# Patient Record
Sex: Male | Born: 1940 | Race: White | Hispanic: No | Smoking: Former smoker
Health system: Southern US, Community
[De-identification: ages and names within clinical notes are randomized; demographics above are authoritative.]

## PROBLEM LIST (undated history)

## (undated) DIAGNOSIS — Z972 Presence of dental prosthetic device (complete) (partial): Secondary | ICD-10-CM

## (undated) DIAGNOSIS — Z950 Presence of cardiac pacemaker: Secondary | ICD-10-CM

## (undated) DIAGNOSIS — G510 Bell's palsy: Secondary | ICD-10-CM

## (undated) DIAGNOSIS — R269 Unspecified abnormalities of gait and mobility: Secondary | ICD-10-CM

## (undated) DIAGNOSIS — R413 Other amnesia: Secondary | ICD-10-CM

## (undated) DIAGNOSIS — I10 Essential (primary) hypertension: Secondary | ICD-10-CM

## (undated) DIAGNOSIS — Z789 Other specified health status: Secondary | ICD-10-CM

## (undated) DIAGNOSIS — I6529 Occlusion and stenosis of unspecified carotid artery: Secondary | ICD-10-CM

## (undated) DIAGNOSIS — E1142 Type 2 diabetes mellitus with diabetic polyneuropathy: Secondary | ICD-10-CM

## (undated) DIAGNOSIS — K7460 Unspecified cirrhosis of liver: Secondary | ICD-10-CM

## (undated) DIAGNOSIS — I639 Cerebral infarction, unspecified: Secondary | ICD-10-CM

## (undated) DIAGNOSIS — N2889 Other specified disorders of kidney and ureter: Secondary | ICD-10-CM

## (undated) DIAGNOSIS — G459 Transient cerebral ischemic attack, unspecified: Secondary | ICD-10-CM

## (undated) DIAGNOSIS — C801 Malignant (primary) neoplasm, unspecified: Secondary | ICD-10-CM

## (undated) DIAGNOSIS — G4730 Sleep apnea, unspecified: Secondary | ICD-10-CM

## (undated) DIAGNOSIS — F0390 Unspecified dementia without behavioral disturbance: Secondary | ICD-10-CM

## (undated) DIAGNOSIS — E119 Type 2 diabetes mellitus without complications: Secondary | ICD-10-CM

## (undated) DIAGNOSIS — F319 Bipolar disorder, unspecified: Secondary | ICD-10-CM

## (undated) DIAGNOSIS — D649 Anemia, unspecified: Secondary | ICD-10-CM

## (undated) HISTORY — DX: Other amnesia: R41.3

## (undated) HISTORY — DX: Anemia, unspecified: D64.9

## (undated) HISTORY — DX: Unspecified dementia, unspecified severity, without behavioral disturbance, psychotic disturbance, mood disturbance, and anxiety: F03.90

## (undated) HISTORY — DX: Occlusion and stenosis of unspecified carotid artery: I65.29

## (undated) HISTORY — DX: Unspecified abnormalities of gait and mobility: R26.9

## (undated) HISTORY — DX: Bell's palsy: G51.0

## (undated) HISTORY — PX: TONSILLECTOMY: SUR1361

## (undated) HISTORY — PX: NASAL SINUS SURGERY: SHX719

## (undated) HISTORY — DX: Type 2 diabetes mellitus with diabetic polyneuropathy: E11.42

## (undated) HISTORY — DX: Bipolar disorder, unspecified: F31.9

## (undated) HISTORY — PX: PACEMAKER PLACEMENT: SHX43

## (undated) HISTORY — DX: Type 2 diabetes mellitus without complications: E11.9

---

## 2004-07-12 ENCOUNTER — Ambulatory Visit: Payer: Self-pay | Admitting: Endocrinology

## 2004-11-14 ENCOUNTER — Ambulatory Visit: Payer: Self-pay | Admitting: Endocrinology

## 2005-02-12 ENCOUNTER — Ambulatory Visit: Payer: Self-pay | Admitting: Endocrinology

## 2005-02-24 ENCOUNTER — Other Ambulatory Visit: Payer: Self-pay

## 2005-02-24 ENCOUNTER — Emergency Department: Payer: Self-pay | Admitting: General Practice

## 2005-02-27 ENCOUNTER — Ambulatory Visit: Payer: Self-pay | Admitting: Emergency Medicine

## 2005-05-14 ENCOUNTER — Ambulatory Visit: Payer: Self-pay | Admitting: Endocrinology

## 2005-08-04 ENCOUNTER — Ambulatory Visit: Payer: Self-pay | Admitting: Endocrinology

## 2005-10-09 ENCOUNTER — Other Ambulatory Visit: Payer: Self-pay

## 2005-10-09 ENCOUNTER — Ambulatory Visit: Payer: Self-pay | Admitting: Otolaryngology

## 2005-12-01 ENCOUNTER — Ambulatory Visit: Payer: Self-pay | Admitting: Endocrinology

## 2006-03-10 ENCOUNTER — Ambulatory Visit: Payer: Self-pay | Admitting: Endocrinology

## 2006-07-06 ENCOUNTER — Ambulatory Visit: Payer: Self-pay | Admitting: Endocrinology

## 2006-11-03 ENCOUNTER — Ambulatory Visit: Payer: Self-pay | Admitting: Endocrinology

## 2007-03-29 ENCOUNTER — Encounter: Payer: Self-pay | Admitting: Endocrinology

## 2007-03-29 DIAGNOSIS — J45909 Unspecified asthma, uncomplicated: Secondary | ICD-10-CM | POA: Insufficient documentation

## 2007-03-29 DIAGNOSIS — Z87442 Personal history of urinary calculi: Secondary | ICD-10-CM | POA: Insufficient documentation

## 2007-03-29 DIAGNOSIS — D649 Anemia, unspecified: Secondary | ICD-10-CM | POA: Insufficient documentation

## 2007-03-29 DIAGNOSIS — J309 Allergic rhinitis, unspecified: Secondary | ICD-10-CM | POA: Insufficient documentation

## 2007-03-29 DIAGNOSIS — F419 Anxiety disorder, unspecified: Secondary | ICD-10-CM

## 2007-03-29 DIAGNOSIS — M1990 Unspecified osteoarthritis, unspecified site: Secondary | ICD-10-CM

## 2007-03-29 DIAGNOSIS — F329 Major depressive disorder, single episode, unspecified: Secondary | ICD-10-CM

## 2007-06-22 ENCOUNTER — Ambulatory Visit: Payer: Self-pay | Admitting: Endocrinology

## 2007-06-22 ENCOUNTER — Encounter: Payer: Self-pay | Admitting: Endocrinology

## 2007-06-22 DIAGNOSIS — E119 Type 2 diabetes mellitus without complications: Secondary | ICD-10-CM

## 2007-06-22 LAB — CONVERTED CEMR LAB: Hgb A1c MFr Bld: 6.2 % — ABNORMAL HIGH (ref 4.6–6.0)

## 2007-09-10 ENCOUNTER — Encounter: Payer: Self-pay | Admitting: Endocrinology

## 2007-10-19 ENCOUNTER — Ambulatory Visit: Payer: Self-pay | Admitting: Endocrinology

## 2007-10-19 LAB — CONVERTED CEMR LAB: Hgb A1c MFr Bld: 6.2 % — ABNORMAL HIGH (ref 4.6–6.0)

## 2008-02-16 ENCOUNTER — Encounter: Payer: Self-pay | Admitting: Endocrinology

## 2008-02-17 ENCOUNTER — Ambulatory Visit: Payer: Self-pay | Admitting: Endocrinology

## 2008-02-17 LAB — CONVERTED CEMR LAB: Hgb A1c MFr Bld: 6.3 % — ABNORMAL HIGH (ref 4.6–6.0)

## 2008-07-04 ENCOUNTER — Ambulatory Visit: Payer: Self-pay | Admitting: Endocrinology

## 2008-07-06 ENCOUNTER — Telehealth: Payer: Self-pay | Admitting: Endocrinology

## 2008-09-14 ENCOUNTER — Telehealth: Payer: Self-pay | Admitting: Endocrinology

## 2008-12-05 ENCOUNTER — Ambulatory Visit: Payer: Self-pay | Admitting: Endocrinology

## 2009-01-30 ENCOUNTER — Telehealth: Payer: Self-pay | Admitting: Endocrinology

## 2009-08-15 ENCOUNTER — Ambulatory Visit: Payer: Self-pay | Admitting: Family Medicine

## 2011-01-17 NOTE — Consult Note (Signed)
[facility]                            ENDOCRINOLOGY CONSULTATION   NAME:[name] [name], [name] [name]                   MRN:          [id number]  DATE:07/06/2006                            DOB:          1941/06/17    REASON FOR VISIT:  Follow-up diabetes.   HISTORY OF PRESENT ILLNESS:  A 70 year old man who states his glucoses are  well controlled except it is slightly high in the morning.  He feels this is  higher than it is at h.s., due to his bedtime insulin possibly not being  enough to cover his bedtime snack.   PAST MEDICAL HISTORY:  Same at August 04, 2005.   REVIEW OF SYSTEMS:  Denies hypoglycemia.   PHYSICAL EXAMINATION:  GENERAL APPEARANCE:  No distress.  Insulin injection  sites at the anterior abdomen are normal.  VITAL SIGNS:  Blood pressure 149/85, heart rate 67, temperature 98, weight  274.   LABORATORY STUDIES:  On July 06, 2006, hemoglobin A1c is 6.6.   IMPRESSION:  Well controlled diabetes.   PLAN:  1. Continue Humalog 30 breakfast, 20 lunch, 45 at supper and 15 at h.s.,      if you eat a snack at h.s.  2. Return in about 4 months.    ______________________________  Cleophas Dunker. Everardo All, MD    SAE/MedQ  DD: 07/07/2006  DT: 07/08/2006  Job #: [id number]   cc:   Aram Beecham, M.D.

## 2011-01-17 NOTE — Consult Note (Signed)
[facility]                          ENDOCRINOLOGY CONSULTATION   NAME:[name], [name] BUCKLIN                   MRN:          [id number]  DATE:11/03/2006                            DOB:          01/21/1941    REASON FOR VISIT:  Follow up diabetes.   HISTORY OF PRESENT ILLNESS:  A 70 year old man who states his glucoses  have increased to the mid 100s at most times of day but are the highest  at bedtime when they are about 200.   PAST MEDICAL HISTORY:  1. Depression/anxiety.  2. Thalassemia minor.  3. Osteoarthritis.  4. Asthma.  5. Allergic rhinitis.  6. Urolithiasis.  7. Prostatitis.  8. Sleep apnea.  9. Diabetes, peripheral sensory neuropathy.   REVIEW OF SYSTEMS:  He has mild occasional hypoglycemia after lunch.  He  has lost 15 pounds since his last visit, due to his efforts.   PHYSICAL EXAMINATION:  VITAL SIGNS:  Blood pressure 137/72, heart rate  74, temperature 97.9.  The weight is 262.  GENERAL:  In no distress.  EXTREMITIES:  Feet normal color and temperature.  There is no ulcer  present on the feet.  Dorsalis pedis pulses are intact bilaterally.  Sensation is intact on the feet to touch, but it is decreased from  normal.   LABORATORY STUDIES:  Hemoglobin A1C 7.5.   IMPRESSION:  He needs an increase in his insulin.   PLAN:  1. Increase q.a.c. Humalog to 30 (15 lunch and 55 at supper).  2. Return in three months or sooner if glucoses do not come back down      to the low 100s with this new schedule.     Sean A. Everardo All, MD  Electronically Signed    SAE/MedQ  DD: 11/04/2006  DT: 11/04/2006  Job #: [id number]   cc:   Aram Beecham, M.D.

## 2013-11-14 DIAGNOSIS — I350 Nonrheumatic aortic (valve) stenosis: Secondary | ICD-10-CM | POA: Insufficient documentation

## 2014-05-22 DIAGNOSIS — Z794 Long term (current) use of insulin: Secondary | ICD-10-CM | POA: Insufficient documentation

## 2014-05-22 DIAGNOSIS — E1165 Type 2 diabetes mellitus with hyperglycemia: Secondary | ICD-10-CM | POA: Insufficient documentation

## 2014-05-22 DIAGNOSIS — E118 Type 2 diabetes mellitus with unspecified complications: Secondary | ICD-10-CM

## 2014-05-22 DIAGNOSIS — IMO0002 Reserved for concepts with insufficient information to code with codable children: Secondary | ICD-10-CM | POA: Insufficient documentation

## 2014-05-23 DIAGNOSIS — E662 Morbid (severe) obesity with alveolar hypoventilation: Secondary | ICD-10-CM | POA: Insufficient documentation

## 2014-05-23 DIAGNOSIS — E538 Deficiency of other specified B group vitamins: Secondary | ICD-10-CM | POA: Insufficient documentation

## 2015-01-19 DIAGNOSIS — E1159 Type 2 diabetes mellitus with other circulatory complications: Secondary | ICD-10-CM | POA: Insufficient documentation

## 2015-01-19 DIAGNOSIS — I10 Essential (primary) hypertension: Secondary | ICD-10-CM

## 2015-02-05 DIAGNOSIS — G4733 Obstructive sleep apnea (adult) (pediatric): Secondary | ICD-10-CM | POA: Insufficient documentation

## 2015-02-05 DIAGNOSIS — I6523 Occlusion and stenosis of bilateral carotid arteries: Secondary | ICD-10-CM | POA: Insufficient documentation

## 2015-02-05 DIAGNOSIS — E1169 Type 2 diabetes mellitus with other specified complication: Secondary | ICD-10-CM | POA: Insufficient documentation

## 2015-02-05 DIAGNOSIS — E785 Hyperlipidemia, unspecified: Secondary | ICD-10-CM

## 2015-03-08 ENCOUNTER — Inpatient Hospital Stay: Admission: RE | Admit: 2015-03-08 | Payer: Self-pay | Source: Ambulatory Visit

## 2015-03-19 ENCOUNTER — Inpatient Hospital Stay: Admission: RE | Admit: 2015-03-19 | Payer: Self-pay | Source: Ambulatory Visit

## 2015-05-17 ENCOUNTER — Other Ambulatory Visit: Payer: Medicare Other

## 2015-05-31 ENCOUNTER — Ambulatory Visit: Admission: RE | Admit: 2015-05-31 | Payer: Medicare Other | Source: Ambulatory Visit | Admitting: Otolaryngology

## 2015-05-31 ENCOUNTER — Encounter: Admission: RE | Payer: Self-pay | Source: Ambulatory Visit

## 2015-05-31 SURGERY — SINUS SURGERY, ENDOSCOPIC
Anesthesia: Choice | Laterality: Bilateral

## 2016-02-17 ENCOUNTER — Encounter: Payer: Self-pay | Admitting: Emergency Medicine

## 2016-02-17 ENCOUNTER — Emergency Department
Admission: EM | Admit: 2016-02-17 | Discharge: 2016-02-17 | Disposition: A | Discharge status: Home / Self Care | Payer: Medicare Other | Attending: Emergency Medicine | Admitting: Emergency Medicine

## 2016-02-17 ENCOUNTER — Emergency Department: Payer: Medicare Other

## 2016-02-17 DIAGNOSIS — F329 Major depressive disorder, single episode, unspecified: Secondary | ICD-10-CM | POA: Diagnosis not present

## 2016-02-17 DIAGNOSIS — Y9389 Activity, other specified: Secondary | ICD-10-CM | POA: Insufficient documentation

## 2016-02-17 DIAGNOSIS — E119 Type 2 diabetes mellitus without complications: Secondary | ICD-10-CM | POA: Diagnosis not present

## 2016-02-17 DIAGNOSIS — W1839XA Other fall on same level, initial encounter: Secondary | ICD-10-CM | POA: Insufficient documentation

## 2016-02-17 DIAGNOSIS — J45909 Unspecified asthma, uncomplicated: Secondary | ICD-10-CM | POA: Insufficient documentation

## 2016-02-17 DIAGNOSIS — S42251A Displaced fracture of greater tuberosity of right humerus, initial encounter for closed fracture: Secondary | ICD-10-CM | POA: Insufficient documentation

## 2016-02-17 DIAGNOSIS — M810 Age-related osteoporosis without current pathological fracture: Secondary | ICD-10-CM | POA: Insufficient documentation

## 2016-02-17 DIAGNOSIS — S4991XA Unspecified injury of right shoulder and upper arm, initial encounter: Secondary | ICD-10-CM | POA: Diagnosis present

## 2016-02-17 DIAGNOSIS — Y999 Unspecified external cause status: Secondary | ICD-10-CM | POA: Insufficient documentation

## 2016-02-17 DIAGNOSIS — S42301A Unspecified fracture of shaft of humerus, right arm, initial encounter for closed fracture: Secondary | ICD-10-CM

## 2016-02-17 DIAGNOSIS — Y9289 Other specified places as the place of occurrence of the external cause: Secondary | ICD-10-CM | POA: Diagnosis not present

## 2016-02-17 MED ORDER — OXYCODONE-ACETAMINOPHEN 7.5-325 MG PO TABS: 1.0000 | ORAL_TABLET | ORAL | Status: DC | PRN

## 2016-02-17 MED ORDER — KETOROLAC TROMETHAMINE 30 MG/ML IJ SOLN
30.0000 mg | Freq: Once | INTRAMUSCULAR | Status: AC
  Administered 2016-02-17: 30 mg via INTRAMUSCULAR
  Filled 2016-02-17: qty 1

## 2016-02-17 MED ORDER — OXYCODONE-ACETAMINOPHEN 5-325 MG PO TABS
ORAL_TABLET | ORAL | Status: AC
  Administered 2016-02-17: 1 via ORAL
  Filled 2016-02-17: qty 1

## 2016-02-17 MED ORDER — OXYCODONE-ACETAMINOPHEN 5-325 MG PO TABS
1.0000 | ORAL_TABLET | Freq: Once | ORAL | Status: AC
  Administered 2016-02-17: 1 via ORAL

## 2016-02-17 NOTE — Discharge Instructions (Signed)
Humerus Fracture Treated With Immobilization The humerus is the large bone in the upper arm. A broken (fractured) humerus is often treated by wearing a cast, splint, or sling (immobilization). This holds the broken pieces in place so they can heal.  HOME CARE  Put ice on the injured area.  Put ice in a plastic bag.  Place a towel between your skin and the bag.  Leave the ice on for 15-20 minutes, 03-04 times a day.  If you are given a cast:  Do not scratch the skin under the cast.  Check the skin around the cast every day. You may put lotion on any red or sore areas.  Keep the cast dry and clean.  If you are given a splint:  Wear the splint as told.  Keep the splint clean and dry.  Loosen the elastic around the splint if your fingers become numb, cold, tingle, or turn blue.  If you are given a sling:  Wear the sling as told.  Do not put pressure on any part of the cast or splint until it is fully hardened.  The cast or splint must be protected with a plastic bag during bathing. Do not lower the cast or splint into water.  Only take medicine as told by your doctor.  Do exercises as told by your doctor.  Follow up as told by your doctor. GET HELP RIGHT AWAY IF:   Your skin or fingernails turn blue or gray.  Your arm feels cold or numb.  You have very bad pain in the injured arm.  You are having problems with the medicines you were given. MAKE SURE YOU:   Understand these instructions.  Will watch your condition.  Will get help right away if you are not doing well or get worse.   This information is not intended to replace advice given to you by your health care provider. Make sure you discuss any questions you have with your health care provider.   Document Released: 02/04/2008 Document Revised: 09/08/2014 Document Reviewed: 01/10/2015 Elsevier Interactive Patient Education [employer].  Please return immediately if condition worsens. Please contact  her primary physician or the physician you were given for referral. If you have any specialist physicians involved in her treatment and plan please also contact them. Thank you for using [facility].

## 2016-02-17 NOTE — ED Notes (Signed)
Patient presents to Emergency Department via EMS with complaints of right shoulder pain s/p falling in concrete driveway.  Pt reports turning and losing balance then stumbling down inclined ultimately landing with right shoulder on grass area.  Pt worse with movement.    Circulation and sensation intact to right hand, no deformities noted.

## 2016-02-17 NOTE — ED Provider Notes (Signed)
Time Seen: Approximately 0 7:30  I have reviewed the triage notes  Chief Complaint: Fall and Shoulder Injury   History of Present Illness: [name] Day is a 75 y.o. male who had a non-syncopal fall today. He states he was in the driveway and then over to pick up something and lost his balance. He states he thinks he landed directly on his right shoulder. He denies any head trauma or loss of consciousness. He states he's got some mild soreness in his right hip but is been able to get up and ambulate without discomfort. He denies any cervical thoracic or lumbar spine pain.   History reviewed. No pertinent past medical history.  Patient Active Problem List   Diagnosis Date Noted  . DM 06/22/2007  . ANEMIA-NOS 03/29/2007  . ANXIETY 03/29/2007  . DEPRESSION 03/29/2007  . ALLERGIC RHINITIS 03/29/2007  . ASTHMA 03/29/2007  . OSTEOARTHRITIS 03/29/2007  . NEPHROLITHIASIS, HX OF 03/29/2007    History reviewed. No pertinent past surgical history.  History reviewed. No pertinent past surgical history.  Current Outpatient Rx  Name  Route  Sig  Dispense  Refill  . oxyCODONE-acetaminophen (PERCOCET) 7.5-325 MG tablet   Oral   Take 1 tablet by mouth every 4 (four) hours as needed for severe pain.   20 tablet   0     Allergies:  Lisinopril and Penicillins  Family History: History reviewed. No pertinent family history.  Social History: Social History  Substance Use Topics  . Smoking status: Never Smoker   . Smokeless tobacco: None  . Alcohol Use: No     Review of Systems:   10 point review of systems was performed and was otherwise negative:  Constitutional: No fever Eyes: No visual disturbances ENT: No sore throat, ear pain Cardiac: No chest pain Respiratory: No shortness of breath, wheezing, or stridor Abdomen: No abdominal pain, no vomiting, No diarrhea Endocrine: No weight loss, No night sweats Extremities: No peripheral edema, cyanosis Skin: No rashes,  easy bruising Neurologic: No focal weakness, trouble with speech or swollowing Urologic: No dysuria, Hematuria, or urinary frequency   Physical Exam:  ED Triage Vitals  Enc Vitals Group     BP 02/17/16 0629 160/50 mmHg     Pulse Rate 02/17/16 0629 74     Resp 02/17/16 0629 20     Temp 02/17/16 0629 98.3 F (36.8 C)     Temp Source 02/17/16 0629 Oral     SpO2 02/17/16 0629 96 %     Weight 02/17/16 0629 285 lb (129.275 kg)     Height 02/17/16 0629 5\' 9"  (1.753 m)     Head Cir --      Peak Flow --      Pain Score 02/17/16 0633 7     Pain Loc --      Pain Edu? --      Excl. in [place]? --     General: Awake , Alert , and Oriented times 3; GCS 15 Head: Normal cephalic , atraumatic Eyes: Pupils equal , round, reactive to light Nose/Throat: No nasal drainage, patent upper airway without erythema or exudate.  Neck: Supple, Full range of motion, No anterior adenopathy or palpable thyroid masses Lungs: Clear to ascultation without wheezes , rhonchi, or rales Heart: Regular rate, regular rhythm without murmurs , gallops , or rubs Abdomen: Obese, Soft, non tender without rebound, guarding , or rigidity; bowel sounds positive and symmetric in all 4 quadrants. No organomegaly .  Extremities: Examination of the right shoulder shows some crepitus and tenderness over the lateral surface of the proximal humerus. Joint appears overall stable. Neurologic and vascular function the right upper extremity is intact over radial ulnar and medial nerve distribution along with a radial and ulnar arteries. Neurologic: normal ambulation, Motor symmetric without deficits, sensory intact Skin: warm, dry, no rashes   L  Radiology: *     DG Shoulder Right (Final result) Result time: 02/17/16 07:10:12   Final result by Rad Results In Interface (02/17/16 07:10:12)   Narrative:   CLINICAL DATA: Acute right shoulder pain after fall today. Initial encounter.  EXAM: RIGHT SHOULDER - 2+  VIEW  COMPARISON: None.  FINDINGS: Mildly displaced fracture is seen involving the greater tuberosity of the proximal right humerus. No dislocation is noted. Joint spaces appear to be intact. Visualize ribs appear normal.  IMPRESSION: Mildly displaced fracture involving the greater tuberosity of the proximal right humerus.   Electronically Signed By: Marijo Conception, M.D. On: 02/17/2016 07:10        I personally reviewed the radiologic studies   Procedures: *Patient application of a right arm sling by the nurse assistant ED Course: * Patient's stay here was uneventful and his extremity appears to be neurovascularly intact. Patient will likely heal by secondary intent and was had a right arm sling applied. He was given a prescription for Percocet and advised take ibuprofen over-the-counter. He states he was followed by [facility] clinic and will be referred to orthopedics there.    Assessment:  Proximal fracture of the right humerus (greater tuberosity)   Final Clinical Impression:  Final diagnoses:  Fracture, humerus closed, right, initial encounter     Plan:  Outpatient Percocet Patient was advised to return immediately if condition worsens. Patient was advised to follow up with their primary care physician or other specialized physicians involved in their outpatient care. The patient and/or family member/power of attorney had laboratory results reviewed at the bedside. All questions and concerns were addressed and appropriate discharge instructions were distributed by the nursing staff.             [name] Larsen, MD 02/17/16 [phone]

## 2016-03-27 ENCOUNTER — Other Ambulatory Visit: Payer: Self-pay | Admitting: Physician Assistant

## 2016-03-27 DIAGNOSIS — S42231A 3-part fracture of surgical neck of right humerus, initial encounter for closed fracture: Secondary | ICD-10-CM

## 2016-04-09 ENCOUNTER — Ambulatory Visit
Admission: RE | Admit: 2016-04-09 | Discharge: 2016-04-09 | Disposition: A | Discharge status: Home / Self Care | Payer: Medicare Other | Source: Ambulatory Visit | Attending: Physician Assistant | Admitting: Physician Assistant

## 2016-04-09 DIAGNOSIS — S42231A 3-part fracture of surgical neck of right humerus, initial encounter for closed fracture: Secondary | ICD-10-CM

## 2016-04-21 ENCOUNTER — Other Ambulatory Visit: Payer: Self-pay | Admitting: Internal Medicine

## 2016-04-21 DIAGNOSIS — R55 Syncope and collapse: Secondary | ICD-10-CM

## 2016-04-21 DIAGNOSIS — Z967 Presence of other bone and tendon implants: Secondary | ICD-10-CM | POA: Insufficient documentation

## 2016-04-24 ENCOUNTER — Emergency Department: Payer: Medicare Other

## 2016-04-24 ENCOUNTER — Inpatient Hospital Stay
Admission: EM | Admit: 2016-04-24 | Discharge: 2016-04-24 | DRG: 309 | Disposition: A | Discharge status: Other Acute Inpatient Hospital | Payer: Medicare Other | Attending: Internal Medicine | Admitting: Internal Medicine

## 2016-04-24 ENCOUNTER — Inpatient Hospital Stay: Admit: 2016-04-24 | Payer: Medicare Other

## 2016-04-24 ENCOUNTER — Encounter: Payer: Self-pay | Admitting: Emergency Medicine

## 2016-04-24 ENCOUNTER — Ambulatory Visit: Admission: RE | Admit: 2016-04-24 | Payer: Medicare Other | Source: Ambulatory Visit

## 2016-04-24 DIAGNOSIS — D751 Secondary polycythemia: Secondary | ICD-10-CM | POA: Diagnosis present

## 2016-04-24 DIAGNOSIS — Z87442 Personal history of urinary calculi: Secondary | ICD-10-CM

## 2016-04-24 DIAGNOSIS — I248 Other forms of acute ischemic heart disease: Secondary | ICD-10-CM | POA: Diagnosis present

## 2016-04-24 DIAGNOSIS — E039 Hypothyroidism, unspecified: Secondary | ICD-10-CM | POA: Diagnosis present

## 2016-04-24 DIAGNOSIS — Z880 Allergy status to penicillin: Secondary | ICD-10-CM | POA: Diagnosis not present

## 2016-04-24 DIAGNOSIS — I4430 Unspecified atrioventricular block: Secondary | ICD-10-CM | POA: Diagnosis present

## 2016-04-24 DIAGNOSIS — Z8673 Personal history of transient ischemic attack (TIA), and cerebral infarction without residual deficits: Secondary | ICD-10-CM

## 2016-04-24 DIAGNOSIS — Z888 Allergy status to other drugs, medicaments and biological substances status: Secondary | ICD-10-CM

## 2016-04-24 DIAGNOSIS — J45909 Unspecified asthma, uncomplicated: Secondary | ICD-10-CM | POA: Diagnosis present

## 2016-04-24 DIAGNOSIS — Z823 Family history of stroke: Secondary | ICD-10-CM | POA: Diagnosis not present

## 2016-04-24 DIAGNOSIS — R55 Syncope and collapse: Secondary | ICD-10-CM

## 2016-04-24 DIAGNOSIS — D696 Thrombocytopenia, unspecified: Secondary | ICD-10-CM | POA: Diagnosis present

## 2016-04-24 DIAGNOSIS — G4733 Obstructive sleep apnea (adult) (pediatric): Secondary | ICD-10-CM | POA: Diagnosis present

## 2016-04-24 DIAGNOSIS — R0902 Hypoxemia: Secondary | ICD-10-CM | POA: Diagnosis present

## 2016-04-24 DIAGNOSIS — R531 Weakness: Secondary | ICD-10-CM

## 2016-04-24 DIAGNOSIS — E1165 Type 2 diabetes mellitus with hyperglycemia: Secondary | ICD-10-CM | POA: Diagnosis present

## 2016-04-24 DIAGNOSIS — Z9889 Other specified postprocedural states: Secondary | ICD-10-CM

## 2016-04-24 DIAGNOSIS — W07XXXA Fall from chair, initial encounter: Secondary | ICD-10-CM | POA: Diagnosis present

## 2016-04-24 DIAGNOSIS — R001 Bradycardia, unspecified: Secondary | ICD-10-CM | POA: Diagnosis present

## 2016-04-24 DIAGNOSIS — I10 Essential (primary) hypertension: Secondary | ICD-10-CM | POA: Diagnosis present

## 2016-04-24 DIAGNOSIS — Z8249 Family history of ischemic heart disease and other diseases of the circulatory system: Secondary | ICD-10-CM

## 2016-04-24 DIAGNOSIS — I495 Sick sinus syndrome: Secondary | ICD-10-CM

## 2016-04-24 DIAGNOSIS — Z87891 Personal history of nicotine dependence: Secondary | ICD-10-CM

## 2016-04-24 DIAGNOSIS — R739 Hyperglycemia, unspecified: Secondary | ICD-10-CM

## 2016-04-24 DIAGNOSIS — E875 Hyperkalemia: Secondary | ICD-10-CM | POA: Diagnosis present

## 2016-04-24 DIAGNOSIS — Z794 Long term (current) use of insulin: Secondary | ICD-10-CM

## 2016-04-24 DIAGNOSIS — Z833 Family history of diabetes mellitus: Secondary | ICD-10-CM

## 2016-04-24 HISTORY — DX: Transient cerebral ischemic attack, unspecified: G45.9

## 2016-04-24 LAB — URINALYSIS COMPLETE WITH MICROSCOPIC (ARMC ONLY)
BILIRUBIN URINE: NEGATIVE
Bacteria, UA: NONE SEEN
Glucose, UA: >500 mg/dL — AB
HGB URINE DIPSTICK: NEGATIVE
KETONES UR: NEGATIVE mg/dL
LEUKOCYTES UA: NEGATIVE
NITRITE: NEGATIVE
PH: 6 (ref 5.0–8.0)
PROTEIN: 100 mg/dL — AB
SPECIFIC GRAVITY, URINE: 1.011 (ref 1.005–1.030)

## 2016-04-24 LAB — COMPREHENSIVE METABOLIC PANEL
ALT: 23 U/L (ref 17–63)
AST: 44 U/L — ABNORMAL HIGH (ref 15–41)
Albumin: 3.9 g/dL (ref 3.5–5.0)
Alkaline Phosphatase: 86 U/L (ref 38–126)
Anion gap: 8 (ref 5–15)
BILIRUBIN TOTAL: 1.9 mg/dL — AB (ref 0.3–1.2)
BUN: 14 mg/dL (ref 6–20)
CHLORIDE: 105 mmol/L (ref 101–111)
CO2: 26 mmol/L (ref 22–32)
CREATININE: 1.11 mg/dL (ref 0.61–1.24)
Calcium: 9.2 mg/dL (ref 8.9–10.3)
Glucose, Bld: 348 mg/dL — ABNORMAL HIGH (ref 65–99)
POTASSIUM: 5.9 mmol/L — AB (ref 3.5–5.1)
Sodium: 139 mmol/L (ref 135–145)
TOTAL PROTEIN: 7 g/dL (ref 6.5–8.1)

## 2016-04-24 LAB — CBC WITH DIFFERENTIAL/PLATELET
BASOS ABS: 0 10*3/uL (ref 0–0.1)
BASOS PCT: 0 %
EOS PCT: 0 %
Eosinophils Absolute: 0 10*3/uL (ref 0–0.7)
HCT: 42.2 % (ref 40.0–52.0)
Hemoglobin: 13.4 g/dL (ref 13.0–18.0)
LYMPHS PCT: 11 %
Lymphs Abs: 0.8 10*3/uL — ABNORMAL LOW (ref 1.0–3.6)
MCH: 22.1 pg — ABNORMAL LOW (ref 26.0–34.0)
MCHC: 31.8 g/dL — ABNORMAL LOW (ref 32.0–36.0)
MCV: 69.5 fL — AB (ref 80.0–100.0)
MONO ABS: 0.7 10*3/uL (ref 0.2–1.0)
Monocytes Relative: 10 %
Neutro Abs: 5.7 10*3/uL (ref 1.4–6.5)
Neutrophils Relative %: 79 %
PLATELETS: 103 10*3/uL — AB (ref 150–440)
RBC: 6.07 MIL/uL — AB (ref 4.40–5.90)
RDW: 16.3 % — AB (ref 11.5–14.5)
WBC: 7.2 10*3/uL (ref 3.8–10.6)

## 2016-04-24 LAB — HEMOGLOBIN A1C: HEMOGLOBIN A1C: 6 % (ref 4.0–6.0)

## 2016-04-24 LAB — CK: Total CK: 111 U/L (ref 49–397)

## 2016-04-24 LAB — TSH: TSH: 2.165 u[IU]/mL (ref 0.350–4.500)

## 2016-04-24 LAB — TROPONIN I: TROPONIN I: 0.09 ng/mL — AB (ref <0.03)

## 2016-04-24 MED ORDER — FUROSEMIDE 10 MG/ML IJ SOLN
20.0000 mg | Freq: Once | INTRAMUSCULAR | Status: AC
  Administered 2016-04-24: 20 mg via INTRAVENOUS
  Filled 2016-04-24: qty 4

## 2016-04-24 MED ORDER — INSULIN ASPART 100 UNIT/ML ~~LOC~~ SOLN
3.0000 [IU] | Freq: Once | SUBCUTANEOUS | Status: AC
  Administered 2016-04-24: 3 [IU] via INTRAVENOUS
  Filled 2016-04-24: qty 3

## 2016-04-24 NOTE — Consult Note (Signed)
[facility]  CARDIOLOGY CONSULT NOTE  Patient ID: [name] Day MRN: [id number] DOB/AGE: 1941-07-22 75 y.o.  Admit date: 04/24/2016 Referring Physician Dr. Ellwood Dense Primary Physician Dr. Doy Hutching Primary Cardiologist Dr. Nehemiah Massed Reason for Consultation syncope  HPI: Pt is a 75 yo male with history of mild aortic stenosis with av area of 1.8 by echo in 2016, history of diabetes melitus with several month history of syncope or near syncope. He had a stress echo in 2016 showing no ischemia with normal lv funciton and mild as. He has a history of thalassemia minor with chronic anemia, ckd iii and sleep apnea with intolerance of cpap. He has had the episodes of near syncope which pt felt were tias but no focal chnages. He slid out of his chair last pm and was on the floor for several hours. Pt has no recollection of the event. 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 He is on no rate related meds. Serum potassium was 5.9. Glucose 348. Troponin 0.09.He deneis chest pain.   Review of Systems  HENT: Negative.   Eyes: Negative.   Respiratory: Positive for shortness of breath.   Cardiovascular: Positive for leg swelling.  Gastrointestinal: Negative.   Genitourinary: Negative.   Musculoskeletal: Negative.   Skin: Negative.   Neurological: Positive for dizziness, loss of consciousness and weakness.  Endo/Heme/Allergies: Negative.   Psychiatric/Behavioral: Negative.     Past Medical History:  Diagnosis Date  . TIA (transient ischemic attack)     History reviewed. No pertinent family history.  Social History   Social History  . Marital status: Widowed    Spouse name: N/A  . Number of children: N/A  . Years of education: N/A   Occupational History  . Not on file.   Social History Main Topics  . Smoking status: Former Research scientist (life sciences)  . Smokeless tobacco: Never Used  . Alcohol use Yes      Comment: occasional  . Drug use: No  . Sexual activity: Not on file   Other Topics Concern  . Not on file   Social History Narrative  . No narrative on file    Past Surgical History:  Procedure Laterality Date  . NASAL SINUS SURGERY    . TONSILLECTOMY        (Not in a hospital admission)  Physical Exam: Blood pressure (!) 143/45, pulse 78, temperature 97.9 F (36.6 C), temperature source Oral, resp. rate (!) 21, height 5\' 10"  (1.778 m), weight 131.5 kg (290 lb), SpO2 95 %.   Wt Readings from Last 1 Encounters:  04/24/16 131.5 kg (290 lb)     General appearance: alert and cooperative Head: Normocephalic, without obvious abnormality, atraumatic Resp: clear to auscultation bilaterally Chest wall: no tenderness Cardio: regular rate and rhythm GI: soft, non-tender; bowel sounds normal; no masses,  no organomegaly Extremities: extremities normal, atraumatic, no cyanosis or edema Pulses: 2+ and symmetric Neurologic: Grossly normal  Labs:   Lab Results  Component Value Date   WBC 7.2 04/24/2016   HGB 13.4 04/24/2016   HCT 42.2 04/24/2016   MCV 69.5 (L) 04/24/2016   PLT 103 (L) 04/24/2016    Recent Labs Lab 04/24/16 1131  NA 139  K 5.9*  CL 105  CO2 26  BUN 14  CREATININE 1.11  CALCIUM 9.2  PROT 7.0  BILITOT 1.9*  ALKPHOS 86  ALT 23  AST 44*  GLUCOSE  348*   Lab Results  Component Value Date   CKTOTAL 111 04/24/2016   TROPONINI 0.09 ([place]) 04/24/2016      Radiology: csr showed no infiltrate or effusion. Head ct showed no acute intracranial abnormality.    ASSESSMENT AND PLAN:  Pt with history of fairly frequent near syncopal or syncopal episodes over the past several months who presented to the er after falling out of his chair and unable to get up for several hours. Fall was unwitnessed and patient is not aware of whether he passed out or fell. His serum K was some what elevated. Telemetry revealed repeated intermitant bradycardia with probable  ventricular escape at 30-40. The episodic syncope is likely secondary to his intermittant heart block. Will need ppm. Procedure not available at [facility] currently. WIll attempt to arrange admission to [facility] for consideration for ppm.  Signed: Teodoro Spray MD, [facility] 04/24/2016, 4:28 PM

## 2016-04-24 NOTE — ED Notes (Signed)
Pt arrives via [facility].  Per report, pt fell last night due to unknown reason.  Pt unable to recall fall. Reports falling in the kitchen.  Pt estimated to be down on the floor for 7-8 hours.  Pt has medical alert call button that he did not use. Pt does not know why he did not use it. Pt was unable to get up due to previous injury to right shoulder.  Pt arrives to ED alert and oriented and has no complaints of pain.

## 2016-04-24 NOTE — ED Triage Notes (Signed)
Pt via ems from home. Fell last night and rolled around on floor for 6-8 hours until someone got there this morning. Pt had alert button but doesn't know why he didn't push it. No complaints of pain; denies loc

## 2016-04-24 NOTE — ED Provider Notes (Signed)
Dr. Satira Mccallum arranges transport to [place]. I feel that M Teleform. I need to add patient had no nasal septal hematoma on examination   Nena Polio, MD 04/24/16 [phone]

## 2016-04-24 NOTE — ED Notes (Signed)
Pt returned from CT.  Per CT tech, pt vomiting while in CT.  PT currently nausea.

## 2016-04-24 NOTE — ED Provider Notes (Signed)
[facility] Emergency Department Provider Note   ____________________________________________   First MD Initiated Contact with Patient 04/24/16 1139     (approximate)  I have reviewed the triage vital signs and the nursing notes.   HISTORY  Chief Complaint Fall   HPI [name] Day is a 75 y.o. male patient comes in reporting he slid out of the chair in the dining room last nighthe was unable to get up and rolled floor all night he says. He was on the floor for at least 6-8 hours. His sons are both concerned that he did actually pass out. Patient has a little bit vague about remembering what happened he says he thinks he just slid out of the chair. He has an abrasion on his forehead a bruised nose and abrasions on his arms and legs. Patient reports nothing hurts him. Patient reports he had a TIA some time ago he was standing up and just fell down and broke his shoulder. Patient's sons report he's not thinking as clearly as he was previously. Patient's heart rate drops from the mid 70s down to the upper 30s several times while in the room. Patient denies any symptoms although he's reclining when this happens.   Past Medical History:  Diagnosis Date  . TIA (transient ischemic attack)     Patient Active Problem List   Diagnosis Date Noted  . DM 06/22/2007  . ANEMIA-NOS 03/29/2007  . ANXIETY 03/29/2007  . DEPRESSION 03/29/2007  . ALLERGIC RHINITIS 03/29/2007  . ASTHMA 03/29/2007  . OSTEOARTHRITIS 03/29/2007  . NEPHROLITHIASIS, HX OF 03/29/2007    Past Surgical History:  Procedure Laterality Date  . NASAL SINUS SURGERY    . TONSILLECTOMY      Prior to Admission medications   Medication Sig Start Date End Date Taking? Authorizing Provider  oxyCODONE-acetaminophen (PERCOCET) 7.5-325 MG tablet Take 1 tablet by mouth every 4 (four) hours as needed for severe pain. 02/17/16 02/16/17  [name], MD    Allergies Clindamycin/lincomycin;  Lisinopril; and Penicillins  History reviewed. No pertinent family history.  Social History Social History  Substance Use Topics  . Smoking status: Former Research scientist (life sciences)  . Smokeless tobacco: Never Used  . Alcohol use Yes     Comment: occasional    Review of Systems Constitutional: No fever/chills Eyes: No visual changes. ENT: No sore throat. Cardiovascular: Denies chest pain. Respiratory: Denies shortness of breath. Gastrointestinal: No abdominal pain.  No nausea, no vomiting.  No diarrhea.  No constipation. Genitourinary: Negative for dysuria. Musculoskeletal: Negative for back pain. Skin: Negative for rash. Neurological: Negative for headaches, focal weakness or numbness.  10-point ROS otherwise negative.  ____________________________________________   PHYSICAL EXAM:  VITAL SIGNS: ED Triage Vitals  Enc Vitals Group     BP 04/24/16 1052 (!) 144/56     Pulse Rate 04/24/16 1052 76     Resp 04/24/16 1052 16     Temp 04/24/16 1052 97.9 F (36.6 C)     Temp Source 04/24/16 1052 Oral     SpO2 04/24/16 1052 97 %     Weight 04/24/16 1054 290 lb (131.5 kg)     Height 04/24/16 1054 5\' 10"  (1.778 m)     Head Circumference --      Peak Flow --      Pain Score --      Pain Loc --      Pain Edu? --      Excl. in [place]? --  Constitutional: Alert and oriented. Well appearing and in no acute distress. Eyes: Conjunctivae are normal. PERRL. EOMI. Head:mall abrasion on the right forehead Nose: No congestion/rhinnorhea.there is no nasal septal hematoma. Thecartilaginous part of the nose is bruised however Mouth/Throat: Mucous membranes are moist.  Oropharynx non-erythematous. Neck: No stridor.  No cervical spine tenderness to palpation. Hematological/Lymphatic/Immunilogical: No cervical lymphadenopathy. Cardiovascular: Normal rate, regular rhythm. Grossly normal heart sounds.  Good peripheral circulation. Respiratory: Normal respiratory effort.  No retractions. Lungs  CTAB. Gastrointestinal: Soft and nontender. No distention. No abdominal bruits. No CVA tenderness. Musculoskeletal: there are changes of chronic vascular stasis in the legs. There is some slight edema. There are some abrasions on the left shin. Neurologic:  Normal speech and language. No gross focal neurologic deficits are appreciated.    ____________________________________________   LABS (all labs ordered are listed, but only abnormal results are displayed)  Labs Reviewed  COMPREHENSIVE METABOLIC PANEL - Abnormal; Notable for the following:       Result Value   Potassium 5.9 (*)    Glucose, Bld 348 (*)    AST 44 (*)    Total Bilirubin 1.9 (*)    All other components within normal limits  TROPONIN I - Abnormal; Notable for the following:    Troponin I 0.09 (*)    All other components within normal limits  CBC WITH DIFFERENTIAL/PLATELET - Abnormal; Notable for the following:    RBC 6.07 (*)    MCV 69.5 (*)    MCH 22.1 (*)    MCHC 31.8 (*)    RDW 16.3 (*)    Platelets 103 (*)    Lymphs Abs 0.8 (*)    All other components within normal limits  CK  URINALYSIS COMPLETEWITH MICROSCOPIC ([facility] ONLY)   ____________________________________________  EKG  EKG read and interpretby me shows sinus rhythm at a rate of 79prolonged PR interval computer is reading right bundle-branch block and left anterior hemiblock. The QRS axis are wideruary 2007. EKG #2 read and interpreted by s sinus bradyc rate of 41 left axis similar to previous except for the rate.I should add that the monitor strip does down as low as 37 further review the EKG shows some of this may not be sinus. ____________________________________________  RADIOLOGY  CLINICAL DATA:  Weakness.  Fall  EXAM: PORTABLE CHEST 1 VIEW  COMPARISON:  02/24/2005  FINDINGS: Cardiac enlargement. Negative for heart failure. Lungs are clear without infiltrate or effusion.  IMPRESSION: No active disease.  CLINICAL DATA:  Fall  last night, weakness  EXAM: CT HEAD WITHOUT CONTRAST  TECHNIQUE: Contiguous axial images were obtained from the base of the skull through the vertex without intravenous contrast.  COMPARISON:  None.  FINDINGS: Brain: No intracranial hemorrhage, mass effect or midline shift. Mild cerebral atrophy. Mild periventricular and patchy subcortical white matter decreased attenuation probable due to chronic small vessel ischemic changes. No acute cortical infarction. No mass lesion is noted on this unenhanced scan. Small lacunar infarct noted bilateral basal ganglia.  Vascular: Mild atherosclerotic calcifications of carotid siphon.  Skull: No skull fracture is noted.  Sinuses/Orbits: There is mucosal thickening with complete opacification of the right maxillary sinus. Sinusitis cannot be excluded. Mucosal thickening with partial opacification right ethmoid air cells. The mastoid air cells are unremarkable.  Other: None  IMPRESSION: 1. No acute intracranial abnormality. Mild cerebral atrophy. Mild periventricular and patchy subcortical white matter decreased attenuation probable due to chronic small vessel ischemic changes. No definite acute cortical infarction. Small lacunar infarct noted bilateral  basal ganglia. 2. There is mucosal thickening with complete opacification of right maxillary sinus. Partial opacification right ethmoid air cells.   Electronically Signed   By: Lahoma Crocker M.D.   On: 04/24/2016 12:22 ____________________________________________   PROCEDURES  Procedure(s) performed:   Procedures  Critical Care performed:   ____________________________________________   INITIAL IMPRESSION / ASSESSMENT AND PLAN / ED COURSE  Pertinent labs & imaging results that were available during my care of the patient were reviewed by me and considered in my medical decision making (see chart for details).    Clinical Course      ____________________________________________   FINAL CLINICAL IMPRESSION(S) / ED DIAGNOSES  Final diagnoses:  Sick sinus syndrome ([place])      NEW MEDICATIONS STARTED DURING THIS VISIT:  New Prescriptions   No medications on file     Note:  This document was prepared using Dragon voice recognition software and may include unintentional dictation errors.    Nena Polio, MD 04/24/16 [phone]

## 2016-04-24 NOTE — ED Notes (Signed)
Pt called out, pt using urinal on side of the bed; pt also has blood coming out of nose while blowing advs MD malinda

## 2016-04-24 NOTE — ED Notes (Signed)
Attempted to call report to [facility], they are unable to take report at this time

## 2016-04-24 NOTE — H&P (Addendum)
[facility] [place] NAME: [name] Day    MR#:  [id number]  DATE OF BIRTH:  07-02-1941  DATE OF ADMISSION:  04/24/2016  PRIMARY CARE PHYSICIAN: SPARKS,JEFFREY D, MD   REQUESTING/REFERRING PHYSICIAN:   CHIEF COMPLAINT:   Chief Complaint  Patient presents with  . Fall   The patient is 75 year old Caucasian male with past medical history significant for history of diabetes, hyperlipidemia, essential hypertension, morbid obesity, bipolar disorder, hypothyroidism, ADHD, obesity, sleep apnea, on CPAP at home, who presents to the hospital with complaints of syncopal and presyncopal episodes, going on for the past 8 weeks. Apparently patient became very weak and syncopized about 8 weeks ago, breaking right arm/shoulder. He is been having those episodes which she calls "freezing" episodes when he becomes very weak, lightheaded and dizzy, but  has no chest pains or shortness of breath . He had very similar episode while he was sitting at the desk today , became very tired, syncopized and fell down on the floor. He could not get off the floor due to pain in his shoulder and severe weakness.  The episodes would come up to twice a day.  Now they appear even while he is in the sitting position, in the past while he was standing, they would last about 30 minutes and would go away. There is no lateralized weakness with these episodes or numbness, no swallowing, visual or speech disturbance.  Patient arrived to emergency room and was noted to have severe bradycardia, high grade AV block. Heart rate as low as 30s. Cardiology consultation was obtained, Dr. Ubaldo Glassing saw patient in consultation and felt that patient will likely need permanent pacemaker, hospitalist services were contacted for admission.  HISTORY OF PRESENT ILLNESS: [name] Day  is a 75 y.o. male with a known history of iabetes, hyperlipidemia, essential hypertension, morbid obesity, bipolar  disorder, hypothyroidism, ADHD, obesity, sleep apnea, on CPAP at home, who presents to the hospital with complaints of syncopal and presyncopal episodes, going on for the past 8 weeks. Apparently patient became very weak and syncopized about 8 weeks ago, breaking right arm/shoulder. He is been having those episodes which she calls "freezing" episodes when he becomes very weak, lightheaded and dizzy, but  has no chest pains or shortness of breath . He had very similar episode while he was sitting at the desk today , became very tired, syncopized and fell down on the floor. He could not get off the floor due to pain in his shoulder and severe weakness.  The episodes would come up to twice a day.  Now they appear even while he is in the sitting position, in the past while he was standing, they would last about 30 minutes and would go away. There is no lateralized weakness with these episodes or numbness, no swallowing, visual or speech disturbance.  Patient arrived to emergency room and was noted to have severe bradycardia, high grade AV block. Heart rate as low as 30s. Cardiology consultation was obtained, Dr. Ubaldo Glassing saw patient in consultation and felt that patient will likely need permanent pacemaker, hospitalist services were contacted for admission.   PAST MEDICAL HISTORY:   Past Medical History:  Diagnosis Date  . TIA (transient ischemic attack)   Aortic stenosis, asthma, bipolar disorder, chronic kidney disease, chronic prostatitis, depression, diabetes mellitus type 2, uncomplicated, and felt dysfunction, environmental allergies, nephrolithiasis, noncardiac chest pain, rectal bleeding due to hemorrhoids, sinusitis, obstructive sleep apnea  PAST SURGICAL HISTORY: Past  Surgical History:  Procedure Laterality Date  . NASAL SINUS SURGERY    . TONSILLECTOMY      SOCIAL HISTORY:  Social History  Substance Use Topics  . Smoking status: Former Research scientist (life sciences)  . Smokeless tobacco: Never Used  . Alcohol use  Yes     Comment: occasional    FAMILY HISTORY: Allergies - patient's mother and father, sister, mental illness in patient's father, diabetes mellitus in patient's father, hypertension, stroke   DRUG ALLERGIES:  Allergies  Allergen Reactions  . Clindamycin/Lincomycin     "turns me purple"  . Lisinopril     REACTION: Cough  . Penicillins     Review of Systems  Constitutional: Positive for malaise/fatigue. Negative for chills, fever and weight loss.  HENT: Negative for congestion.   Eyes: Negative for blurred vision and double vision.  Respiratory: Negative for cough, sputum production, shortness of breath and wheezing.   Cardiovascular: Positive for orthopnea and leg swelling. Negative for chest pain, palpitations and PND.  Gastrointestinal: Positive for nausea and vomiting. Negative for abdominal pain, blood in stool, constipation and diarrhea.  Genitourinary: Negative for dysuria, frequency, hematuria and urgency.  Musculoskeletal: Negative for falls.  Neurological: Negative for dizziness, tremors, focal weakness and headaches.  Endo/Heme/Allergies: Does not bruise/bleed easily.  Psychiatric/Behavioral: Negative for depression. The patient does not have insomnia.     MEDICATIONS AT HOME:  Prior to Admission medications   Medication Sig Start Date End Date Taking? Authorizing Provider  oxyCODONE-acetaminophen (PERCOCET) 7.5-325 MG tablet Take 1 tablet by mouth every 4 (four) hours as needed for severe pain. 02/17/16 02/16/17  [name], MD      PHYSICAL EXAMINATION:   VITAL SIGNS: Blood pressure 124/63, pulse (!) 51, temperature 97.9 F (36.6 C), temperature source Oral, resp. rate 16, height 5\' 10"  (1.778 m), weight 131.5 kg (290 lb), SpO2 97 %.  GENERAL:  75 y.o.-year-old patient lying in the bed with no acute distress.  EYES: Pupils equal, round, reactive to light and accommodation. No scleral icterus. Extraocular muscles intact.  HEENT: Head atraumatic,  normocephalic. Oropharynx and nasopharynx clear.  NECK:  Supple, no jugular venous distention. No thyroid enlargement, no tenderness.  LUNGS: Some diminished breath sounds bilaterally, no wheezing, rales,rhonchi or crepitation. No use of accessory muscles of respiration.  CARDIOVASCULAR: S1, S2 . Rhythm was irregular, bradycardic. A 4/6 systolic murmurs, radiating to left axilla and bilateral carotids was noted,, no rubs, or gallops.  ABDOMEN: Soft, nontender, nondistended. Bowel sounds present. No organomegaly or mass.  EXTREMITIES: 1-2+ lower extremity and pedal edema, bilateral lower extremity erythema, brownish discoloration due to venous insufficiency, ulcerations and anterior legs, no calf tenderness, cyanosis, or clubbing.  NEUROLOGIC: Cranial nerves II through XII are intact. Muscle strength 5/5 in all extremities. Sensation intact. Gait not checked.  PSYCHIATRIC: The patient is alert and oriented x 3.  SKIN: No obvious rash, lesion, or ulcer.   LABORATORY PANEL:   CBC  Recent Labs Lab 04/24/16 1131  WBC 7.2  HGB 13.4  HCT 42.2  PLT 103*  MCV 69.5*  MCH 22.1*  MCHC 31.8*  RDW 16.3*  LYMPHSABS 0.8*  MONOABS 0.7  EOSABS 0.0  BASOSABS 0.0   ------------------------------------------------------------------------------------------------------------------  Chemistries   Recent Labs Lab 04/24/16 1131  NA 139  K 5.9*  CL 105  CO2 26  GLUCOSE 348*  BUN 14  CREATININE 1.11  CALCIUM 9.2  AST 44*  ALT 23  ALKPHOS 86  BILITOT 1.9*   ------------------------------------------------------------------------------------------------------------------  Cardiac Enzymes  Recent Labs Lab 04/24/16 1131  TROPONINI 0.09*   ------------------------------------------------------------------------------------------------------------------  RADIOLOGY: Ct Head Wo Contrast  Result Date: 04/24/2016 CLINICAL DATA:  Fall last night, weakness EXAM: CT HEAD WITHOUT CONTRAST  TECHNIQUE: Contiguous axial images were obtained from the base of the skull through the vertex without intravenous contrast. COMPARISON:  None. FINDINGS: Brain: No intracranial hemorrhage, mass effect or midline shift. Mild cerebral atrophy. Mild periventricular and patchy subcortical white matter decreased attenuation probable due to chronic small vessel ischemic changes. No acute cortical infarction. No mass lesion is noted on this unenhanced scan. Small lacunar infarct noted bilateral basal ganglia. Vascular: Mild atherosclerotic calcifications of carotid siphon. Skull: No skull fracture is noted. Sinuses/Orbits: There is mucosal thickening with complete opacification of the right maxillary sinus. Sinusitis cannot be excluded. Mucosal thickening with partial opacification right ethmoid air cells. The mastoid air cells are unremarkable. Other: None IMPRESSION: 1. No acute intracranial abnormality. Mild cerebral atrophy. Mild periventricular and patchy subcortical white matter decreased attenuation probable due to chronic small vessel ischemic changes. No definite acute cortical infarction. Small lacunar infarct noted bilateral basal ganglia. 2. There is mucosal thickening with complete opacification of right maxillary sinus. Partial opacification right ethmoid air cells. Electronically Signed   By: Lahoma Crocker M.D.   On: 04/24/2016 12:22   Dg Chest Portable 1 View  Result Date: 04/24/2016 CLINICAL DATA:  Weakness.  Fall EXAM: PORTABLE CHEST 1 VIEW COMPARISON:  02/24/2005 FINDINGS: Cardiac enlargement. Negative for heart failure. Lungs are clear without infiltrate or effusion. IMPRESSION: No active disease. Electronically Signed   By: Franchot Gallo M.D.   On: 04/24/2016 11:50    EKG: Orders placed or performed during the hospital encounter of 04/24/16  . EKG 12-Lead  . EKG 12-Lead  . ED EKG  . ED EKG  . ED EKG  . ED EKG    IMPRESSION AND PLAN:  Principal Problem:   AV heart block Active  Problems:   Hyperglycemia   Generalized weakness   Syncope and collapse #1. High-grade AV heart block, admit patient to medical floor, get cardiologist involved for recommendations, place percutaneous pacemaker, patient will likely need permanent pacemaker, discussed with cardiologist, Dr. Ubaldo Glassing #2. Essential hypertension, no beta blocker in agents, follow closely, initiate nitroglycerin patch if needed,  #3. Diabetes mellitus with hyperglycemia, get hemoglobin A1c, continue sliding scale insulin #4. Hyperkalemia, give patient Lasix, recheck potassium level later today #5. Elevated troponin, likely demand ischemia, follow troponins every 6 hours, aspirin, Lovenox subcutaneously, no beta blocker due to bradycardia/AV block #6. 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 Thrombocytopenia, chronic, etiology is unclear, questionable fatty liver disease/hypersplenism related, get pro time INR  All the records are reviewed and case discussed with ED provider. Management plans discussed with the patient, family and they are in agreement.  CODE STATUS: Code Status History    This patient does not have a recorded code status. Please follow your organizational policy for patients in this situation.       TOTAL TIME TAKING CARE OF THIS PATIENT: 60 minutes.   Discussed with cardiology and family extensively Hoa Deriso M.D on 04/24/2016 at 1:04 PM  Between 7am to 6pm - Pager - [phone] After 6pm go to [email or web address] - password [email or web address] [facility]  Office  [phone]  CC: Primary care physician; Idelle Crouch, MD

## 2016-04-25 DIAGNOSIS — Z950 Presence of cardiac pacemaker: Secondary | ICD-10-CM

## 2016-04-25 HISTORY — DX: Presence of cardiac pacemaker: Z95.0

## 2016-04-27 DIAGNOSIS — Z950 Presence of cardiac pacemaker: Secondary | ICD-10-CM | POA: Insufficient documentation

## 2016-05-07 ENCOUNTER — Ambulatory Visit: Payer: Medicare Other | Admitting: Physical Therapy

## 2016-05-14 ENCOUNTER — Ambulatory Visit: Payer: Medicare Other

## 2016-05-14 ENCOUNTER — Ambulatory Visit
Admission: RE | Admit: 2016-05-14 | Discharge: 2016-05-14 | Disposition: A | Discharge status: Home / Self Care | Payer: Medicare Other | Source: Ambulatory Visit | Attending: Internal Medicine | Admitting: Internal Medicine

## 2016-05-14 ENCOUNTER — Other Ambulatory Visit: Payer: Self-pay | Admitting: Internal Medicine

## 2016-05-14 ENCOUNTER — Ambulatory Visit: Payer: Medicare Other | Admitting: Physical Therapy

## 2016-05-14 DIAGNOSIS — R404 Transient alteration of awareness: Secondary | ICD-10-CM | POA: Insufficient documentation

## 2016-05-14 DIAGNOSIS — J322 Chronic ethmoidal sinusitis: Secondary | ICD-10-CM | POA: Diagnosis not present

## 2016-05-14 DIAGNOSIS — Z8673 Personal history of transient ischemic attack (TIA), and cerebral infarction without residual deficits: Secondary | ICD-10-CM | POA: Insufficient documentation

## 2016-05-14 DIAGNOSIS — G319 Degenerative disease of nervous system, unspecified: Secondary | ICD-10-CM | POA: Insufficient documentation

## 2016-05-27 ENCOUNTER — Ambulatory Visit: Payer: Medicare Other | Attending: Physician Assistant | Admitting: Physical Therapy

## 2016-07-29 DIAGNOSIS — F0280 Dementia in other diseases classified elsewhere without behavioral disturbance: Secondary | ICD-10-CM

## 2016-07-29 DIAGNOSIS — G309 Alzheimer's disease, unspecified: Secondary | ICD-10-CM

## 2016-07-29 DIAGNOSIS — F0150 Vascular dementia without behavioral disturbance: Secondary | ICD-10-CM | POA: Insufficient documentation

## 2016-08-13 ENCOUNTER — Ambulatory Visit: Payer: Medicare Other | Attending: Specialist

## 2016-08-13 DIAGNOSIS — G4710 Hypersomnia, unspecified: Secondary | ICD-10-CM | POA: Diagnosis present

## 2016-08-13 DIAGNOSIS — R0681 Apnea, not elsewhere classified: Secondary | ICD-10-CM | POA: Diagnosis present

## 2016-08-13 DIAGNOSIS — G4733 Obstructive sleep apnea (adult) (pediatric): Secondary | ICD-10-CM | POA: Diagnosis not present

## 2016-10-23 ENCOUNTER — Ambulatory Visit: Payer: Self-pay

## 2016-11-05 ENCOUNTER — Encounter: Payer: Self-pay | Admitting: Urology

## 2016-11-05 ENCOUNTER — Ambulatory Visit (INDEPENDENT_AMBULATORY_CARE_PROVIDER_SITE_OTHER): Payer: Medicare Other | Admitting: Urology

## 2016-11-05 VITALS — BP 146/82 | HR 88 | Ht 70.0 in | Wt 290.1 lb

## 2016-11-05 DIAGNOSIS — N481 Balanitis: Secondary | ICD-10-CM | POA: Diagnosis not present

## 2016-11-05 DIAGNOSIS — N138 Other obstructive and reflux uropathy: Secondary | ICD-10-CM

## 2016-11-05 DIAGNOSIS — N401 Enlarged prostate with lower urinary tract symptoms: Secondary | ICD-10-CM | POA: Diagnosis not present

## 2016-11-05 MED ORDER — CLOTRIMAZOLE-BETAMETHASONE 1-0.05 % EX CREA: TOPICAL_CREAM | Freq: Two times a day (BID) | CUTANEOUS | Status: DC

## 2016-11-05 MED ORDER — CLOTRIMAZOLE-BETAMETHASONE 1-0.05 % EX CREA: 1.0000 "application " | TOPICAL_CREAM | Freq: Two times a day (BID) | CUTANEOUS | 3 refills | Status: DC

## 2016-11-05 NOTE — Progress Notes (Signed)
11/05/2016 3:42 PM   [name] Day 01/16/1941 [id number]  Referring provider: Idelle Crouch, MD [facility] [place], [place] [address]  Chief Complaint  Patient presents with  . New Patient (Initial Visit)    BPH    HPI: The patient is a 76 year old gentleman with multiple medical comorbidities including diabetes and sleep apnea who presents today for evaluation of penile glans irritation. The patient was circumcised as a child. He notes that last 30-40 years that is foreskin has appeared to grown back in his own words. It becomes irritated and itches. He is most bothered by the itching. He currently has no pain. He has tried bacitracin for this without luck. He denies histories of yeast infections. He feels that he contracted some sort used in [place] below. He has not tried topical steroids or clotrimazole before.  Of note, the patient had a recent myocardial infarction as well as multiple mini strokes recently. He is a very poor surgical candidate.   PMH: Past Medical History:  Diagnosis Date  . TIA (transient ischemic attack)     Surgical History: Past Surgical History:  Procedure Laterality Date  . NASAL SINUS SURGERY    . TONSILLECTOMY      Home Medications:  Allergies as of 11/05/2016      Reactions   Clindamycin/lincomycin    "turns me purple" "turns me orange"   Lisinopril    REACTION: Cough   Penicillins       Medication List       Accurate as of 11/05/16  3:42 PM. Always use your most recent med list.          amphetamine-dextroamphetamine 20 MG tablet Commonly known as:  ADDERALL Take 1 tablet by mouth daily.   aspirin 325 MG tablet Take 325 mg by mouth daily.   buPROPion 150 MG 24 hr tablet Commonly known as:  WELLBUTRIN XL   donepezil 5 MG tablet Commonly known as:  ARICEPT   escitalopram 10 MG tablet Commonly known as:  LEXAPRO   FREESTYLE LITE test strip Generic drug:  glucose blood   HUMULIN R  500 UNIT/ML injection Generic drug:  insulin regular human CONCENTRATED Inject 25 Units into the skin.   ibuprofen 200 MG tablet Commonly known as:  ADVIL,MOTRIN Take 400 mg by mouth every 6 (six) hours as needed.   losartan 50 MG tablet Commonly known as:  COZAAR Take 1 tablet by mouth daily.   metFORMIN 500 MG 24 hr tablet Commonly known as:  GLUCOPHAGE-XR Take 2 tablets by mouth 2 (two) times daily.   oxyCODONE-acetaminophen 7.5-325 MG tablet Commonly known as:  PERCOCET Take 1 tablet by mouth every 4 (four) hours as needed for severe pain.   pravastatin 20 MG tablet Commonly known as:  PRAVACHOL Take 1 tablet by mouth daily.   sertraline 50 MG tablet Commonly known as:  ZOLOFT Take 1 tablet by mouth daily.   tamsulosin 0.4 MG Caps capsule Commonly known as:  FLOMAX   TANZEUM 50 MG Pen Generic drug:  Albiglutide   Vitamin D (Ergocalciferol) 50000 units Caps capsule Commonly known as:  DRISDOL       Allergies:  Allergies  Allergen Reactions  . Clindamycin/Lincomycin     "turns me purple" "turns me orange"  . Lisinopril     REACTION: Cough  . Penicillins     Family History: No family history on file.  Social History:  reports that he has quit smoking. He has  never used smokeless tobacco. He reports that he drinks alcohol. He reports that he does not use drugs.  ROS: UROLOGY Frequent Urination?: No Hard to postpone urination?: No Burning/pain with urination?: No Get up at night to urinate?: No Leakage of urine?: No Urine stream starts and stops?: Yes Trouble starting stream?: Yes Do you have to strain to urinate?: No Blood in urine?: No Urinary tract infection?: No Sexually transmitted disease?: No Injury to kidneys or bladder?: No Painful intercourse?: Yes Weak stream?: No Erection problems?: Yes Penile pain?: Yes  Gastrointestinal Nausea?: No Vomiting?: No Indigestion/heartburn?: No Diarrhea?: Yes Constipation?:  No  Constitutional Fever: No Night sweats?: No Weight loss?: No Fatigue?: Yes  Skin Skin rash/lesions?: Yes Itching?: Yes  Eyes Blurred vision?: No Double vision?: No  Ears/Nose/Throat Sore throat?: No Sinus problems?: No  Hematologic/Lymphatic Swollen glands?: No Easy bruising?: Yes  Cardiovascular Leg swelling?: Yes Chest pain?: No  Respiratory Cough?: No Shortness of breath?: No  Endocrine Excessive thirst?: Yes  Musculoskeletal Back pain?: No Joint pain?: No  Neurological Headaches?: No Dizziness?: No  Psychologic Depression?: Yes Anxiety?: No  Physical Exam: BP (!) 146/82   Pulse 88   Ht 5\' 10"  (1.778 m)   Wt 290 lb 1.6 oz (131.6 kg)   BMI 41.63 kg/m   Constitutional:  Alert and oriented, No acute distress. HEENT: [place] AT, moist mucus membranes.  Trachea midline, no masses. Cardiovascular: No clubbing, cyanosis, or edema. Respiratory: Normal respiratory effort, no increased work of breathing. GI: Abdomen is soft, nontender, nondistended, no abdominal masses GU: No CVA tenderness. The patient has a secondary phimosis. His glans is visible. Does have erythema and irritation of the inner side of the secondary phimosis skin. Skin: No rashes, bruises or suspicious lesions. Lymph: No cervical or inguinal adenopathy. Neurologic: Grossly intact, no focal deficits, moving all 4 extremities. Psychiatric: Normal mood and affect.  Laboratory Data: Lab Results  Component Value Date   WBC 7.2 04/24/2016   HGB 13.4 04/24/2016   HCT 42.2 04/24/2016   MCV 69.5 (L) 04/24/2016   PLT 103 (L) 04/24/2016    Lab Results  Component Value Date   CREATININE 1.11 04/24/2016    No results found for: PSA  No results found for: TESTOSTERONE  Lab Results  Component Value Date   HGBA1C 6.0 04/24/2016    Urinalysis    Component Value Date/Time   COLORURINE YELLOW (A) 04/24/2016 1435   APPEARANCEUR CLEAR (A) 04/24/2016 1435   LABSPEC 1.011 04/24/2016  1435   PHURINE 6.0 04/24/2016 1435   GLUCOSEU >500 (A) 04/24/2016 1435   HGBUR NEGATIVE 04/24/2016 1435   BILIRUBINUR NEGATIVE 04/24/2016 1435   KETONESUR NEGATIVE 04/24/2016 1435   PROTEINUR 100 (A) 04/24/2016 1435   NITRITE NEGATIVE 04/24/2016 1435   LEUKOCYTESUR NEGATIVE 04/24/2016 1435    Assessment & Plan:    1. Balanitis 2. Secondary phimosis We will try clotrimazole-betamethasone cream to apply to the area twice daily. We will see how this helps him in follow-up in 6 weeks. Due to his recent myocardial infarction and strokes, the patient is a very poor surgical candidate and is not eligible for repeat circumcision due to this.  Return in about 6 weeks (around 12/17/2016).  Nickie Retort, MD  [facility] [address], [place], [place] [address] [phone]

## 2016-11-06 LAB — URINALYSIS, COMPLETE
BILIRUBIN UA: NEGATIVE
KETONES UA: NEGATIVE
NITRITE UA: NEGATIVE
Protein, UA: NEGATIVE
UUROB: 0.2 mg/dL (ref 0.2–1.0)
pH, UA: 6.5 (ref 5.0–7.5)

## 2016-11-06 LAB — MICROSCOPIC EXAMINATION

## 2016-12-04 DIAGNOSIS — R195 Other fecal abnormalities: Secondary | ICD-10-CM | POA: Insufficient documentation

## 2016-12-08 ENCOUNTER — Institutional Professional Consult (permissible substitution): Payer: Medicare Other | Admitting: Pulmonary Disease

## 2016-12-09 ENCOUNTER — Encounter: Payer: Self-pay | Admitting: Pulmonary Disease

## 2016-12-09 ENCOUNTER — Ambulatory Visit (INDEPENDENT_AMBULATORY_CARE_PROVIDER_SITE_OTHER): Payer: Medicare Other | Admitting: Pulmonary Disease

## 2016-12-09 ENCOUNTER — Other Ambulatory Visit
Admission: RE | Admit: 2016-12-09 | Discharge: 2016-12-09 | Disposition: A | Discharge status: Home / Self Care | Payer: Medicare Other | Source: Ambulatory Visit | Attending: Pulmonary Disease | Admitting: Pulmonary Disease

## 2016-12-09 VITALS — BP 136/70 | HR 73 | Wt 285.0 lb

## 2016-12-09 DIAGNOSIS — R0609 Other forms of dyspnea: Secondary | ICD-10-CM

## 2016-12-09 DIAGNOSIS — R942 Abnormal results of pulmonary function studies: Secondary | ICD-10-CM

## 2016-12-09 DIAGNOSIS — Z8709 Personal history of other diseases of the respiratory system: Secondary | ICD-10-CM | POA: Diagnosis not present

## 2016-12-09 DIAGNOSIS — G4733 Obstructive sleep apnea (adult) (pediatric): Secondary | ICD-10-CM | POA: Diagnosis not present

## 2016-12-09 DIAGNOSIS — Z87891 Personal history of nicotine dependence: Secondary | ICD-10-CM | POA: Diagnosis not present

## 2016-12-09 DIAGNOSIS — E662 Morbid (severe) obesity with alveolar hypoventilation: Secondary | ICD-10-CM | POA: Diagnosis present

## 2016-12-09 LAB — BLOOD GAS, ARTERIAL
ACID-BASE EXCESS: 0.9 mmol/L (ref 0.0–2.0)
Bicarbonate: 25.2 mmol/L (ref 20.0–28.0)
FIO2: 0.21
O2 SAT: 94.5 %
PCO2 ART: 38 mmHg (ref 32.0–48.0)
Patient temperature: 37
pH, Arterial: 7.43 (ref 7.350–7.450)
pO2, Arterial: 71 mmHg — ABNORMAL LOW (ref 83.0–108.0)

## 2016-12-09 NOTE — Patient Instructions (Addendum)
Arterial blood gas today We will work with your company regarding the BiPAP issue. I think we will be able to get it for you but we might need to "jump through some hoops" Weight loss as we discussed No changes have been made to your medical regimen Follow up in 6-8 weeks

## 2016-12-11 ENCOUNTER — Other Ambulatory Visit: Payer: Self-pay | Admitting: *Deleted

## 2016-12-11 DIAGNOSIS — G4733 Obstructive sleep apnea (adult) (pediatric): Secondary | ICD-10-CM

## 2016-12-11 NOTE — Progress Notes (Signed)
cpap titration ordered per DS.

## 2016-12-11 NOTE — Progress Notes (Signed)
PULMONARY/SLEEP CONSULT NOTE  Requesting MD/Service: Doy Hutching Date of initial consultation: 12/09/16 Reason for consultation: COPD,OSA  PT PROFILE: 76 y.o. male former smoker (remote) previously seen by Dr Raul Del (10/09/16) for OSA and COPD. He requested referral for re-evaluation of the same  DATA: Echocardiogram 04/25/16: normal LVEF, mild LVH, moderate AS, LA moderately dilated, normal RA and RV Spirometry 10/09/16: FVC: 2.73 L (77 %pred), FEV1: 2.13 L ( 77 %pred), FEV1/FVC: 78% CPAP titration 08/13/16: recommended CPAP 13 cm H2O  HPI:  As above. He was diagnosed with COPD approx 20 years ago. However, spirometry (above) failed to reveal obstruction. He is not on any chronic bronchodilator therapy. He describes minimal DOE and is more limited by fatigue than dyspnea.   His biggest concern is OSA and its treatment. This was initially diagnosed @ [facility] approx 20 yrs ago. He was successfully treated with BiPAP (after intolerance to CPAP). His machine stopped working approx 8 months ago. He has since developed progressive daytime hypersomnolence and generalized fatigue. He underwent CPAP titration study with results as above. He was told that he does not qualify for BiPAP based on titration study results. He is frustrated with this decision hich is what prompted his request for this "second opinion".   Past Medical History:  Diagnosis Date  . TIA (transient ischemic attack)     Past Surgical History:  Procedure Laterality Date  . NASAL SINUS SURGERY    . TONSILLECTOMY    Recent PPM placed  MEDICATIONS: I have reviewed all medications and confirmed regimen as documented  Social History   Social History  . Marital status: Widowed    Spouse name: N/A  . Number of children: N/A  . Years of education: N/A   Occupational History  . Not on file.   Social History Main Topics  . Smoking status: Former Research scientist (life sciences)  . Smokeless tobacco: Never Used  . Alcohol use Yes     Comment:  occasional  . Drug use: No  . Sexual activity: Not on file   Other Topics Concern  . Not on file   Social History Narrative  . No narrative on file    History reviewed. No pertinent family history.  ROS: No fever, myalgias/arthralgias, unexplained weight loss or weight gain No new focal weakness or sensory deficits No otalgia, hearing loss, visual changes, nasal and sinus symptoms, mouth and throat problems No neck pain or adenopathy No abdominal pain, N/V/D, diarrhea, change in bowel pattern No dysuria, change in urinary pattern   Vitals:   12/09/16 1018 12/09/16 1022  BP:  136/70  Pulse:  73  SpO2:  94%  Weight: 129.3 kg (285 lb)      EXAM:  Gen: Obese, No overt distress HEENT: NCAT, sclera white, oropharynx normal Neck: Supple without LAN, thyromegaly, JVD Lungs: breath sounds full, percussion normal, adventitious sounds: none Cardiovascular: RRR, no murmurs noted Abdomen: Soft, nontender, normal BS Ext: without clubbing, cyanosis, edema Neuro: CNs grossly intact, motor and sensory intact Skin: Limited exam, no lesions noted  DATA:   BMP Latest Ref Rng & Units 04/24/2016  Glucose 65 - 99 mg/dL 348(H)  BUN 6 - 20 mg/dL 14  Creatinine 0.61 - 1.24 mg/dL 1.11  Sodium 135 - 145 mmol/L 139  Potassium 3.5 - 5.1 mmol/L 5.9(H)  Chloride 101 - 111 mmol/L 105  CO2 22 - 32 mmol/L 26  Calcium 8.9 - 10.3 mg/dL 9.2    CBC Latest Ref Rng & Units 04/24/2016  WBC 3.8 - 10.6 K/uL  7.2  Hemoglobin 13.0 - 18.0 g/dL 13.4  Hematocrit 40.0 - 52.0 % 42.2  Platelets 150 - 440 K/uL 103(L)    CXR (04/14/16):  NACPD  IMPRESSION:   1. Former smoker 2. Morbid obesity 3. Documented history of COPD - prior PFTs reveal restrictive pattern, not obstructive 4. Exertional dyspnea - mostly due to obesity and deconditioning 5. Obstructive sleep apnea - intolerant to CPAP but has tolerated BiPAP in past with good effect    PLAN:  Arterial blood gas performed to assess for  hypercarbia  PaCO2 normal We will work with his company regarding the BiPAP issue.   He will likely require a titration study to get this approved We discussed the importance of weight loss and dietary strategies to achieve such No changes were made to his medical regimen   Follow up in 6-8 weeks   Merton Border, MD PCCM service Mobile [phone] Pager [phone] 12/11/2016

## 2016-12-19 ENCOUNTER — Ambulatory Visit: Payer: Medicare Other | Admitting: Urology

## 2016-12-24 ENCOUNTER — Ambulatory Visit: Payer: Medicare Other | Attending: Internal Medicine

## 2016-12-24 DIAGNOSIS — E119 Type 2 diabetes mellitus without complications: Secondary | ICD-10-CM | POA: Insufficient documentation

## 2016-12-24 DIAGNOSIS — G4733 Obstructive sleep apnea (adult) (pediatric): Secondary | ICD-10-CM | POA: Insufficient documentation

## 2016-12-24 DIAGNOSIS — R55 Syncope and collapse: Secondary | ICD-10-CM | POA: Diagnosis present

## 2016-12-26 DIAGNOSIS — G4733 Obstructive sleep apnea (adult) (pediatric): Secondary | ICD-10-CM | POA: Diagnosis not present

## 2016-12-29 ENCOUNTER — Telehealth: Payer: Self-pay | Admitting: *Deleted

## 2016-12-29 NOTE — Telephone Encounter (Signed)
-----   Message from Laverle Hobby, MD sent at 12/26/2016  1:29 PM EDT ----- Regarding: CPAP titration results CPAP titration failed to adequately treat the patient's OSA (cpap failure).  Recommend Sleep study with bi-level and O2 titration. If this is not covered by insurance then a Auto-Bipap could be considered with a minimum pressure of 8, maximum pressure of 25, pressure support of 7.

## 2016-12-29 NOTE — Telephone Encounter (Signed)
Called pt to inform him that he needs to be sent to sleep lab per insurance for a Bipap titration study. Pt states he would rather have a script for the machine and let him pay for it out of pocket. States if it too much then he will call us back to do the in lab study. Please advise if you want me to print a script for you to sign for him to buy out of pocket. Thanks.

## 2016-12-31 MED ORDER — AMBULATORY NON FORMULARY MEDICATION: 0 refills | Status: DC

## 2016-12-31 NOTE — Telephone Encounter (Signed)
As long as he understands that this isn't ideal, I'm OK prescribing BiPAP @ 15/12 if he wants to purchase the machine  [place]

## 2016-12-31 NOTE — Telephone Encounter (Signed)
Informed pt of response and he still wants the RX for the BiPAP stating he doesn't want to do any further testing in the lab and wants to pay out of pocket for BiPAP machine. RX printed. Informed pt it would be ready for pick up tomorrow morning. Nothing further needed.

## 2017-01-01 ENCOUNTER — Telehealth: Payer: Self-pay | Admitting: Pulmonary Disease

## 2017-01-01 NOTE — Telephone Encounter (Signed)
Spoke with pt and gave him names of DME companies in [place] and [place]. Nothing further needed.

## 2017-01-01 NOTE — Telephone Encounter (Signed)
Pt came by in office wanting a list supplier we have for his CPAP machine Please advise

## 2017-02-06 ENCOUNTER — Encounter (INDEPENDENT_AMBULATORY_CARE_PROVIDER_SITE_OTHER): Payer: Self-pay | Admitting: Vascular Surgery

## 2017-02-06 ENCOUNTER — Ambulatory Visit (INDEPENDENT_AMBULATORY_CARE_PROVIDER_SITE_OTHER): Payer: Medicare Other | Admitting: Vascular Surgery

## 2017-02-06 VITALS — BP 144/66 | HR 80 | Resp 16 | Ht 70.0 in | Wt 291.0 lb

## 2017-02-06 DIAGNOSIS — E785 Hyperlipidemia, unspecified: Secondary | ICD-10-CM | POA: Insufficient documentation

## 2017-02-06 DIAGNOSIS — I890 Lymphedema, not elsewhere classified: Secondary | ICD-10-CM | POA: Diagnosis not present

## 2017-02-06 DIAGNOSIS — I4430 Unspecified atrioventricular block: Secondary | ICD-10-CM

## 2017-02-06 DIAGNOSIS — N183 Chronic kidney disease, stage 3 unspecified: Secondary | ICD-10-CM

## 2017-02-06 DIAGNOSIS — E1122 Type 2 diabetes mellitus with diabetic chronic kidney disease: Secondary | ICD-10-CM | POA: Diagnosis not present

## 2017-02-06 DIAGNOSIS — M7989 Other specified soft tissue disorders: Secondary | ICD-10-CM | POA: Diagnosis not present

## 2017-02-06 NOTE — Assessment & Plan Note (Signed)
This is likely multifactorial with the patient's multiple medical issues including heart disease and kidney disease. In addition, his mobility has become more poor and with dependent legs he is going to have more swelling. I believe he has lymphedema from chronic scarring and lymphatic channels from the chronic swelling and other inflammatory issues. We are going to put him back in [employer] today as this works well. He is going to try to get some compression stockings he can put on. I do believe he will benefit from a lymphedema pump at this needs to be done in conjunction with elevation, compression, and increased activity if we expected to improve. I think it is also reasonable to perform a venous duplex for further evaluation for significant venous disease. I will plan to see the patient back in 3-4 months.

## 2017-02-06 NOTE — Assessment & Plan Note (Signed)
blood glucose control important in reducing the progression of atherosclerotic disease. Also, involved in wound healing. On appropriate medications.  

## 2017-02-06 NOTE — Assessment & Plan Note (Signed)
Stage II  I believe he has lymphedema from chronic scarring and lymphatic channels from the chronic swelling and other inflammatory issues. We are going to put him back in [employer] today as this works well. He is going to try to get some compression stockings he can put on. I do believe he will benefit from a lymphedema pump at this needs to be done in conjunction with elevation, compression, and increased activity if we expected to improve. I think it is also reasonable to perform a venous duplex for further evaluation for significant venous disease. I will plan to see the patient back in 3-4 months.

## 2017-02-06 NOTE — Assessment & Plan Note (Signed)
lipid control important in reducing the progression of atherosclerotic disease. Continue statin therapy  

## 2017-02-06 NOTE — Assessment & Plan Note (Signed)
S/p pacer  

## 2017-02-06 NOTE — Patient Instructions (Signed)

## 2017-02-06 NOTE — Assessment & Plan Note (Signed)
Likely contributes to LE swelling 

## 2017-02-06 NOTE — Progress Notes (Signed)
Patient ID: [name] Day, male   DOB: 18-Sep-1940, 76 y.o.   MRN: [id number]  Chief Complaint  Patient presents with  . New Evaluation    Edema    HPI [name] Day is a 76 y.o. male.  I am asked to see the patient by Dr. Doy Hutching for evaluation of leg swelling.  The patient reports months of worsening lower extremity swelling. Has tried compression stockings but really hasn't miserable time getting these on and off. He has been doing in [employer] for the past several weeks with significant improvement in his leg swelling. There is no clear inciting event or causative factor that started his leg swelling. He has a litany of medical issues including kidney disease, heart disease, and his mobility has been impaired over the past couple of years likely leading to worsening swelling. Both lower extremities are affected. The legs are heavy and tired but not overtly painful. He does not currently have ulceration or infection, but has been in [employer] recently.   Past Medical History:  Diagnosis Date  . Anemia   . Bipolar 1 disorder ([place])   . Carotid artery occlusion   . Dementia   . Diabetes mellitus without complication ([place])   . TIA (transient ischemic attack)     Past Surgical History:  Procedure Laterality Date  . NASAL SINUS SURGERY    . PACEMAKER PLACEMENT    . TONSILLECTOMY    . TONSILLECTOMY      Family History No history of bleeding disorders, clotting disorders, autoimmune disease, or aneurysms  Social History Social History  Substance Use Topics  . Smoking status: Former Research scientist (life sciences)  . Smokeless tobacco: Never Used  . Alcohol use Yes     Comment: occasional  No IV drug use  Allergies  Allergen Reactions  . Clindamycin/Lincomycin     "turns me purple" "turns me orange"  . Lisinopril     REACTION: Cough  . Penicillins     Current Outpatient Prescriptions  Medication Sig Dispense Refill  . AMBULATORY NON FORMULARY MEDICATION BiPAP machine @ 15/12 DX:  OSA DX Code: 1 each 0  . amphetamine-dextroamphetamine (ADDERALL) 20 MG tablet Take 1 tablet by mouth daily.    Marland Kitchen aspirin 325 MG tablet Take 325 mg by mouth daily.    [employer] buPROPion (WELLBUTRIN XL) 150 MG 24 hr tablet     . escitalopram (LEXAPRO) 10 MG tablet     . FREESTYLE LITE test strip     . ibuprofen (ADVIL,MOTRIN) 200 MG tablet Take 400 mg by mouth every 6 (six) hours as needed.    . insulin regular human CONCENTRATED (HUMULIN R) 500 UNIT/ML injection Inject 25 Units into the skin.    Marland Kitchen liraglutide (VICTOZA) 18 MG/3ML SOPN Inject into the skin.    Marland Kitchen losartan (COZAAR) 50 MG tablet Take 1 tablet by mouth daily.    . metFORMIN (GLUCOPHAGE-XR) 500 MG 24 hr tablet Take 2 tablets by mouth 2 (two) times daily.    . pravastatin (PRAVACHOL) 20 MG tablet Take 1 tablet by mouth daily.    . sertraline (ZOLOFT) 50 MG tablet Take 1 tablet by mouth daily.    . tamsulosin (FLOMAX) 0.4 MG CAPS capsule     . TANZEUM 50 MG PEN      No current facility-administered medications for this visit.       REVIEW OF SYSTEMS (Negative unless checked)  Constitutional: [] Weight loss  [] Fever  [] Chills Cardiac: [] Chest pain   [] Chest  pressure   [x] Palpitations   [] Shortness of breath when laying flat   [] Shortness of breath at rest   [x] Shortness of breath with exertion. Vascular:  [] Pain in legs with walking   [x] Pain in legs at rest   [] Pain in legs when laying flat   [] Claudication   [] Pain in feet when walking  [] Pain in feet at rest  [] Pain in feet when laying flat   [] History of DVT   [] Phlebitis   [x] Swelling in legs   [] Varicose veins   [] Non-healing ulcers Pulmonary:   [] Uses home oxygen   [] Productive cough   [] Hemoptysis   [] Wheeze  [] COPD   [] Asthma Neurologic:  [] Dizziness  [] Blackouts   [] Seizures   [x] History of stroke   [] History of TIA  [] Aphasia   [] Temporary blindness   [] Dysphagia   [] Weakness or numbness in arms   [] Weakness or numbness in legs Musculoskeletal:  [x] Arthritis   [] Joint swelling    [] Joint pain   [] Low back pain Hematologic:  [] Easy bruising  [] Easy bleeding   [] Hypercoagulable state   [] Anemic  [] Hepatitis Gastrointestinal:  [] Blood in stool   [] Vomiting blood  [] Gastroesophageal reflux/heartburn   [] Abdominal pain Genitourinary:  [x] Chronic kidney disease   [] Difficult urination  [] Frequent urination  [] Burning with urination   [] Hematuria Skin:  [] Rashes   [] Ulcers   [] Wounds Psychological:  [x] History of anxiety   [x]  History of major depression.    Physical Exam BP (!) 144/66 (BP Location: Left Arm)   Pulse 80   Resp 16   Ht 5\' 10"  (1.778 m)   Wt 132 kg (291 lb)   BMI 41.75 kg/m  Gen:  WD/WN, NAD Head: [place]/AT, No temporalis wasting. Ear/Nose/Throat: Hearing grossly intact, nares w/o erythema or drainage, oropharynx w/o Erythema/Exudate Eyes: Conjunctiva clear, sclera non-icteric  Neck: trachea midline.  No JVD.  Pulmonary:  Good air movement, No use of accessory muscles, respirations not labored Cardiac: Irregular Vascular:  Vessel Right Left  Radial Palpable Palpable  Ulnar Palpable Palpable  Brachial Palpable Palpable  Carotid Palpable, without bruit Palpable, without bruit  Aorta Not palpable N/A  Femoral Palpable Palpable  Popliteal Palpable Palpable  PT Palpable Palpable  DP Palpable Palpable   Gastrointestinal: soft, non-tender/non-distended. Musculoskeletal: M/S 5/5 throughout.  Walks with a cane. No deformity or atrophy. 1-2+ bilateral lower extremity edema. Neurologic: Sensation grossly intact in extremities.  Symmetrical.  Speech is fluent. Motor exam as listed above. Psychiatric: Judgment intact, Mood & affect appropriate for pt's clinical situation. Dermatologic: No rashes or ulcers noted.  No cellulitis or open wounds.   Radiology No results found.  Labs Recent Results (from the past 2160 hour(s))  Blood gas, arterial     Status: Abnormal   Collection Time: 12/09/16 12:30 PM  Result Value Ref Range   FIO2 0.21    pH, Arterial  7.43 7.350 - 7.450   pCO2 arterial 38 32.0 - 48.0 mmHg   pO2, Arterial 71 (L) 83.0 - 108.0 mmHg   Bicarbonate 25.2 20.0 - 28.0 mmol/L   Acid-Base Excess 0.9 0.0 - 2.0 mmol/L   O2 Saturation 94.5 %   Patient temperature 37.0    Collection site RIGHT RADIAL    Sample type ARTERIAL DRAW    Allens test (pass/fail) PASS PASS    Assessment/Plan:  CKD (chronic kidney disease), stage III Likely contributes to LE swelling  Diabetes (HCC) blood glucose control important in reducing the progression of atherosclerotic disease. Also, involved in wound healing. On appropriate medications.  AV heart block S/p pacer   Hyperlipidemia lipid control important in reducing the progression of atherosclerotic disease. Continue statin therapy   Swelling of limb This is likely multifactorial with the patient's multiple medical issues including heart disease and kidney disease. In addition, his mobility has become more poor and with dependent legs he is going to have more swelling. I believe he has lymphedema from chronic scarring and lymphatic channels from the chronic swelling and other inflammatory issues. We are going to put him back in [employer] today as this works well. He is going to try to get some compression stockings he can put on. I do believe he will benefit from a lymphedema pump at this needs to be done in conjunction with elevation, compression, and increased activity if we expected to improve. I think it is also reasonable to perform a venous duplex for further evaluation for significant venous disease. I will plan to see the patient back in 3-4 months.  Lymphedema Stage II  I believe he has lymphedema from chronic scarring and lymphatic channels from the chronic swelling and other inflammatory issues. We are going to put him back in [employer] today as this works well. He is going to try to get some compression stockings he can put on. I do believe he will benefit from a lymphedema pump  at this needs to be done in conjunction with elevation, compression, and increased activity if we expected to improve. I think it is also reasonable to perform a venous duplex for further evaluation for significant venous disease. I will plan to see the patient back in 3-4 months.       Leotis Pain 02/06/2017, 11:30 AM   This note was created with Dragon medical transcription system.  Any errors from dictation are unintentional.

## 2017-02-09 ENCOUNTER — Ambulatory Visit (INDEPENDENT_AMBULATORY_CARE_PROVIDER_SITE_OTHER): Payer: Medicare Other | Admitting: Pulmonary Disease

## 2017-02-09 ENCOUNTER — Encounter: Payer: Self-pay | Admitting: Pulmonary Disease

## 2017-02-09 VITALS — BP 138/82 | HR 83 | Ht 70.0 in | Wt 284.0 lb

## 2017-02-09 DIAGNOSIS — G4733 Obstructive sleep apnea (adult) (pediatric): Secondary | ICD-10-CM | POA: Diagnosis not present

## 2017-02-09 MED ORDER — AMBULATORY NON FORMULARY MEDICATION: 0 refills | Status: DC

## 2017-02-09 NOTE — Progress Notes (Signed)
PULMONARY/SLEEP OFFICE FOLLOW-UP NOTE  Requesting MD/Service: Doy Hutching Date of initial consultation: 12/09/16 Reason for consultation: COPD,OSA  PT PROFILE: 76 y.o. male former smoker (remote) previously seen by Dr Raul Del (10/09/16) for OSA and COPD. He requested referral for re-evaluation of the same  DATA: Echocardiogram 04/25/16: normal LVEF, mild LVH, moderate AS, LA moderately dilated, normal RA and RV Spirometry 10/09/16: FVC: 2.73 L (77 %pred), FEV1: 2.13 L ( 77 %pred), FEV1/FVC: 78% CPAP titration 08/13/16: Initiated CPAP 13 cm H2O.   SUBJ:  This is a routine reevaluation for obstructive sleep apnea. He strongly desired to have BiPAP initiated as he has had difficulty tolerating CPAP. He was unwilling to undergo a repeat sleep study and therefore purchased his own CPAP machine. His current settings are 15/12. He describes market improvement in daytime hypersomnolence and cognitive function. He has no new complaints.  OBJ:  Vitals:   02/09/17 1024 02/09/17 1026  BP:  138/82  Pulse:  83  SpO2:  97%  Weight: 284 lb (128.8 kg)   Height: 5\' 10"  (1.778 m)      EXAM:  Gen: Obese, No overt distress HEENT: NCAT, sclera white, oropharynx normal Neck: Supple without LAN, thyromegaly, JVD Lungs: breath sounds fullWithout adventitious sounds Cardiovascular: RRR, Soft systolic murmur Abdomen: Soft, nontender, normal BS Ext: Bilateral Unna boots Neuro: CNs grossly intact, motor and sensory intact Skin: Limited exam, no lesions noted  DATA:   BMP Latest Ref Rng & Units 04/24/2016  Glucose 65 - 99 mg/dL 348(H)  BUN 6 - 20 mg/dL 14  Creatinine 0.61 - 1.24 mg/dL 1.11  Sodium 135 - 145 mmol/L 139  Potassium 3.5 - 5.1 mmol/L 5.9(H)  Chloride 101 - 111 mmol/L 105  CO2 22 - 32 mmol/L 26  Calcium 8.9 - 10.3 mg/dL 9.2    CBC Latest Ref Rng & Units 04/24/2016  WBC 3.8 - 10.6 K/uL 7.2  Hemoglobin 13.0 - 18.0 g/dL 13.4  Hematocrit 40.0 - 52.0 % 42.2  Platelets 150 - 440 K/uL 103(L)     CXR:  NNF  IMPRESSION:   1. Former smoker 2. Morbid obesity 3. Obstructive sleep apnea    PLAN:  Continue BiPAP 15/12 We discussed weight loss strategies including dietary changes emphasizing avoidance of simple carbohydrates   Follow up in 6 months   Merton Border, MD PCCM service Mobile [phone] Pager [phone] 02/09/2017

## 2017-02-09 NOTE — Patient Instructions (Signed)
Continue BiPAP Continue efforts at weight loss Follow-up in 6 months

## 2017-02-12 ENCOUNTER — Ambulatory Visit (INDEPENDENT_AMBULATORY_CARE_PROVIDER_SITE_OTHER): Payer: Medicare Other | Admitting: Neurology

## 2017-02-12 ENCOUNTER — Encounter: Payer: Self-pay | Admitting: Neurology

## 2017-02-12 ENCOUNTER — Encounter: Payer: Self-pay | Admitting: Pulmonary Disease

## 2017-02-12 VITALS — BP 138/59 | HR 71 | Ht 69.5 in | Wt 288.0 lb

## 2017-02-12 DIAGNOSIS — R413 Other amnesia: Secondary | ICD-10-CM | POA: Diagnosis not present

## 2017-02-12 DIAGNOSIS — R0989 Other specified symptoms and signs involving the circulatory and respiratory systems: Secondary | ICD-10-CM

## 2017-02-12 DIAGNOSIS — R269 Unspecified abnormalities of gait and mobility: Secondary | ICD-10-CM | POA: Diagnosis not present

## 2017-02-12 DIAGNOSIS — E538 Deficiency of other specified B group vitamins: Secondary | ICD-10-CM | POA: Diagnosis not present

## 2017-02-12 HISTORY — DX: Unspecified abnormalities of gait and mobility: R26.9

## 2017-02-12 HISTORY — DX: Other amnesia: R41.3

## 2017-02-12 NOTE — Progress Notes (Signed)
Reason for visit: Memory disturbance  Referring physician: Dr. Johnnye Lana is a 76 y.o. male  History of present illness:  [name] Day is a 76 year old right-handed white male with a history of diabetes. The patient has had poorly controlled diabetes with a hemoglobin A1c of 10.1. The patient is sent here primarily for evaluation of a memory disturbance. The patient indicates that he has a history of sleep apnea, he is on BiPAP currently and he believes that he is sleeping fairly well. He indicates that his energy level is fairly good throughout the day. He does report some problems with gait instability, he has fallen on occasion. In August 2017 he had an episode of loss of consciousness, he required a pacemaker shorter thereafter. CT head scan evaluation has shown bilateral basal ganglia infarcts. The patient last fell about 4 months ago. He uses a cane for ambulation. The patient apparently has been reported to the [facility], he has lost his license. His son manages his finances, he claims that he can keep up with medications and appointments, he believes that he is fully able to drive a car. He denies that he has any problems with memory but he does admit to the fact that he has some word finding problems. The patient reports no focal numbness or weakness of the face, arms, or legs. He denies any headaches or dizziness. The patient lives with a girlfriend, he manages most of his day-to-day activities of daily living on his own. He is sent to this office for further evaluation. The patient has a history of vitamin B12 deficiency, he is taking B12 tablets.  Past Medical History:  Diagnosis Date  . Anemia   . Bipolar 1 disorder ([place])   . Carotid artery occlusion   . Dementia   . Diabetes mellitus without complication ([place])   . TIA (transient ischemic attack)     Past Surgical History:  Procedure Laterality Date  . NASAL SINUS SURGERY    . PACEMAKER PLACEMENT    .  TONSILLECTOMY    . TONSILLECTOMY      Family History  Problem Relation Age of Onset  . Family history unknown: Yes    Social history:  reports that he has quit smoking. He has never used smokeless tobacco. He reports that he drinks alcohol. He reports that he does not use drugs.  Medications:  Prior to Admission medications   Medication Sig Start Date End Date Taking? Authorizing Provider  AMBULATORY NON FORMULARY MEDICATION BiPAP machine @ 15/12 DX: OSA DX Code: 02/09/17  Yes Wilhelmina Mcardle, MD  amphetamine-dextroamphetamine (ADDERALL) 20 MG tablet Take 1 tablet by mouth daily. 04/21/16  Yes [provider]  aspirin 325 MG tablet Take 325 mg by mouth daily.   Yes [provider]  buPROPion (WELLBUTRIN XL) 150 MG 24 hr tablet  10/15/16  Yes [provider]  escitalopram (LEXAPRO) 10 MG tablet  10/15/16  Yes [provider]  FREESTYLE LITE test strip  11/03/16  Yes [provider]  ibuprofen (ADVIL,MOTRIN) 200 MG tablet Take 400 mg by mouth every 6 (six) hours as needed.   Yes [provider]  insulin regular human CONCENTRATED (HUMULIN R) 500 UNIT/ML injection Inject 25 Units into the skin. 11/30/15  Yes [provider]  liraglutide (VICTOZA) 18 MG/3ML SOPN Inject into the skin. 01/22/17 02/21/17 Yes [provider]  losartan (COZAAR) 50 MG tablet Take 1 tablet by mouth daily. 04/17/16  Yes [provider]  metFORMIN (GLUCOPHAGE-XR) 500 MG 24 hr tablet Take 2 tablets by mouth 2 (two) times daily. 04/17/16  Yes [provider]  pravastatin (PRAVACHOL) 20 MG tablet Take 1 tablet by mouth daily. 04/07/16  Yes [provider]  sertraline (ZOLOFT) 50 MG tablet Take 1 tablet by mouth daily. 04/17/16  Yes [provider]  tamsulosin (FLOMAX) 0.4 MG CAPS capsule  11/01/16  Yes [provider]  TANZEUM 50 MG PEN  09/07/16  Yes [provider]      Allergies  Allergen Reactions  .  Clindamycin/Lincomycin     "turns me purple" "turns me orange"  . Lisinopril     REACTION: Cough  . Penicillins     ROS:  Out of a complete 14 system review of symptoms, the patient complains only of the following symptoms, and all other reviewed systems are negative.  Heart murmur, swelling in the legs Blood in the stool, diarrhea Impotence Itching Allergies  Blood pressure (!) 138/59, pulse 71, height 5' 9.5" (1.765 m), weight 288 lb (130.6 kg).  Physical Exam  General: The patient is alert and cooperative at the time of the examination. The patient is markedly obese.  Eyes: Pupils are equal, round, and reactive to light. Discs are flat bilaterally.  Neck: The neck is supple, no carotid bruits noted in the right greater than left carotid arteries, possible radiation from heart.  Respiratory: The respiratory examination is clear.  Cardiovascular: The cardiovascular examination reveals a regular rate and rhythm, no obvious murmurs or rubs are noted.  Skin: Extremities are with 3+ edema below the knees bilaterally.  Neurologic Exam  Mental status: The patient is alert and oriented x 3 at the time of the examination. The patient has apparent normal recent and remote memory, with an apparently normal attention span and concentration ability. The patient refused to undergo Mini-Mental Status Examination testing.  Cranial nerves: Facial symmetry is present. There is good sensation of the face to pinprick and soft touch bilaterally. The strength of the facial muscles and the muscles to head turning and shoulder shrug are normal bilaterally. Speech is well enunciated, no aphasia or dysarthria is noted. Extraocular movements are full. Visual fields are full. The tongue is midline, and the patient has symmetric elevation of the soft palate. No obvious hearing deficits are noted.  Motor: The motor testing reveals 5 over 5 strength of all 4 extremities. Good symmetric motor tone is noted  throughout.  Sensory: Sensory testing is intact to pinprick, soft touch, vibration sensation, and position sense on all 4 extremities. No evidence of extinction is noted.  Coordination: Cerebellar testing reveals good finger-nose-finger and heel-to-shin bilaterally.  Gait and station: Gait is wide-based, unsteady. The patient uses a cane for ambulation. Tandem gait was not attempted. Romberg is negative. No drift is seen.  Reflexes: Deep tendon reflexes are symmetric, but are depressed bilaterally. Toes are downgoing bilaterally.   CT brain 05/14/16:  IMPRESSION: Right ethmoid sinusitis. Mild diffuse cortical atrophy. Stable bilateral basal ganglia lacunar infarctions. No acute intracranial abnormality seen.  * CT scan images were reviewed online. I agree with the written report.    Assessment/Plan:  1. Mild memory disturbance  2. Gait disturbance  The patient has apparently has undergone formal neuropsychological evaluation through the [facility] on 01/16/2017. 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 The patient will be sent for further blood work today. CT of the brain does show some cerebrovascular  disease. He appears to have right greater than left carotid bruits, this could the a radiation from a heart murmur. Carotid Doppler studies will be done. The patient will follow-up in 6 months, the memory issue may be followed over time. The patient appears to be hesitant to subject himself to memory evaluation, he does not believe that he has a significant cognitive issue. No family members were present during this evaluation.  [name] Alexanders MD 02/12/2017 9:44 AM  [facility] [address] [place], [place] [address]  Phone [phone] Fax [phone]

## 2017-02-13 LAB — RPR: RPR: NONREACTIVE

## 2017-02-13 LAB — SEDIMENTATION RATE: Sed Rate: 10 mm/hr (ref 0–30)

## 2017-02-13 LAB — VITAMIN B12: Vitamin B-12: 415 pg/mL (ref 232–1245)

## 2017-02-25 ENCOUNTER — Ambulatory Visit: Payer: Medicare Other | Admitting: Anesthesiology

## 2017-02-25 ENCOUNTER — Encounter: Payer: Self-pay | Admitting: *Deleted

## 2017-02-25 ENCOUNTER — Ambulatory Visit
Admission: RE | Admit: 2017-02-25 | Discharge: 2017-02-25 | Disposition: A | Discharge status: Home / Self Care | Payer: Medicare Other | Source: Ambulatory Visit | Attending: Unknown Physician Specialty | Admitting: Unknown Physician Specialty

## 2017-02-25 ENCOUNTER — Encounter
Admission: RE | Disposition: A | Discharge status: Home / Self Care | Payer: Self-pay | Source: Ambulatory Visit | Attending: Unknown Physician Specialty

## 2017-02-25 DIAGNOSIS — K3189 Other diseases of stomach and duodenum: Secondary | ICD-10-CM | POA: Insufficient documentation

## 2017-02-25 DIAGNOSIS — D509 Iron deficiency anemia, unspecified: Secondary | ICD-10-CM | POA: Diagnosis not present

## 2017-02-25 DIAGNOSIS — K766 Portal hypertension: Secondary | ICD-10-CM | POA: Diagnosis not present

## 2017-02-25 DIAGNOSIS — E669 Obesity, unspecified: Secondary | ICD-10-CM | POA: Insufficient documentation

## 2017-02-25 DIAGNOSIS — Z7984 Long term (current) use of oral hypoglycemic drugs: Secondary | ICD-10-CM | POA: Insufficient documentation

## 2017-02-25 DIAGNOSIS — K210 Gastro-esophageal reflux disease with esophagitis: Secondary | ICD-10-CM | POA: Diagnosis not present

## 2017-02-25 DIAGNOSIS — K2270 Barrett's esophagus without dysplasia: Secondary | ICD-10-CM | POA: Insufficient documentation

## 2017-02-25 DIAGNOSIS — E119 Type 2 diabetes mellitus without complications: Secondary | ICD-10-CM | POA: Diagnosis not present

## 2017-02-25 DIAGNOSIS — D122 Benign neoplasm of ascending colon: Secondary | ICD-10-CM | POA: Insufficient documentation

## 2017-02-25 DIAGNOSIS — Z6838 Body mass index (BMI) 38.0-38.9, adult: Secondary | ICD-10-CM | POA: Insufficient documentation

## 2017-02-25 DIAGNOSIS — Z79899 Other long term (current) drug therapy: Secondary | ICD-10-CM | POA: Diagnosis not present

## 2017-02-25 DIAGNOSIS — Z87891 Personal history of nicotine dependence: Secondary | ICD-10-CM | POA: Diagnosis not present

## 2017-02-25 DIAGNOSIS — Z8673 Personal history of transient ischemic attack (TIA), and cerebral infarction without residual deficits: Secondary | ICD-10-CM | POA: Diagnosis not present

## 2017-02-25 DIAGNOSIS — D123 Benign neoplasm of transverse colon: Secondary | ICD-10-CM | POA: Insufficient documentation

## 2017-02-25 DIAGNOSIS — K640 First degree hemorrhoids: Secondary | ICD-10-CM | POA: Diagnosis not present

## 2017-02-25 DIAGNOSIS — F319 Bipolar disorder, unspecified: Secondary | ICD-10-CM | POA: Diagnosis not present

## 2017-02-25 HISTORY — PX: COLONOSCOPY WITH PROPOFOL: SHX5780

## 2017-02-25 HISTORY — PX: ESOPHAGOGASTRODUODENOSCOPY (EGD) WITH PROPOFOL: SHX5813

## 2017-02-25 LAB — GLUCOSE, CAPILLARY: Glucose-Capillary: 259 mg/dL — ABNORMAL HIGH (ref 65–99)

## 2017-02-25 SURGERY — COLONOSCOPY WITH PROPOFOL
Anesthesia: General

## 2017-02-25 MED ORDER — PROPOFOL 10 MG/ML IV BOLUS
INTRAVENOUS | Status: AC
  Filled 2017-02-25: qty 20

## 2017-02-25 MED ORDER — PROPOFOL 500 MG/50ML IV EMUL
INTRAVENOUS | Status: DC | PRN
  Administered 2017-02-25: 120 ug/kg/min via INTRAVENOUS

## 2017-02-25 MED ORDER — MIDAZOLAM HCL 2 MG/2ML IJ SOLN
INTRAMUSCULAR | Status: DC | PRN
  Administered 2017-02-25: 2 mg via INTRAVENOUS

## 2017-02-25 MED ORDER — GLYCOPYRROLATE 0.2 MG/ML IJ SOLN
INTRAMUSCULAR | Status: AC
  Filled 2017-02-25: qty 1

## 2017-02-25 MED ORDER — GLYCOPYRROLATE 0.2 MG/ML IJ SOLN
INTRAMUSCULAR | Status: DC | PRN
  Administered 2017-02-25: 0.1 mg via INTRAVENOUS

## 2017-02-25 MED ORDER — PROPOFOL 500 MG/50ML IV EMUL
INTRAVENOUS | Status: AC
  Filled 2017-02-25: qty 50

## 2017-02-25 MED ORDER — FENTANYL CITRATE (PF) 100 MCG/2ML IJ SOLN
INTRAMUSCULAR | Status: AC
  Filled 2017-02-25: qty 2

## 2017-02-25 MED ORDER — LIDOCAINE HCL (PF) 2 % IJ SOLN
INTRAMUSCULAR | Status: AC
  Filled 2017-02-25: qty 2

## 2017-02-25 MED ORDER — FENTANYL CITRATE (PF) 100 MCG/2ML IJ SOLN
INTRAMUSCULAR | Status: DC | PRN
  Administered 2017-02-25 (×2): 50 ug via INTRAVENOUS

## 2017-02-25 MED ORDER — MIDAZOLAM HCL 2 MG/2ML IJ SOLN
INTRAMUSCULAR | Status: AC
  Filled 2017-02-25: qty 2

## 2017-02-25 MED ORDER — SODIUM CHLORIDE 0.9 % IV SOLN
INTRAVENOUS | Status: DC
  Administered 2017-02-25: 08:00:00 via INTRAVENOUS

## 2017-02-25 MED ORDER — LIDOCAINE HCL (CARDIAC) 20 MG/ML IV SOLN
INTRAVENOUS | Status: DC | PRN
  Administered 2017-02-25: 30 mg via INTRAVENOUS

## 2017-02-25 NOTE — OR Nursing (Signed)
Ascending Colon Polyp Not Polyp not retrieved.

## 2017-02-25 NOTE — Anesthesia Postprocedure Evaluation (Signed)
Anesthesia Post Note  Patient: [name] Day  Procedure(s) Performed: Procedure(s) (LRB): COLONOSCOPY WITH PROPOFOL (N/A) ESOPHAGOGASTRODUODENOSCOPY (EGD) WITH PROPOFOL (N/A)  Patient location during evaluation: Endoscopy Anesthesia Type: General Level of consciousness: awake and alert and oriented Pain management: pain level controlled Vital Signs Assessment: post-procedure vital signs reviewed and stable Respiratory status: spontaneous breathing, nonlabored ventilation and respiratory function stable Cardiovascular status: blood pressure returned to baseline and stable Postop Assessment: no signs of nausea or vomiting Anesthetic complications: no     Last Vitals:  Vitals:   02/25/17 0920 02/25/17 0930  BP: 109/60 118/74  Pulse: 88 90  Resp: 18 (!) 22  Temp:      Last Pain:  Vitals:   02/25/17 0910  TempSrc: Tympanic                 Iysis Germain

## 2017-02-25 NOTE — Op Note (Signed)
[facility] Gastroenterology Patient Name: [name] Day Procedure Date: 02/25/2017 8:22 AM MRN: [id number] Account #: [id number] Date of Birth: 1941/06/08 Admit Type: Outpatient Age: 76 Room: [facility] ENDO ROOM 3 Gender: Male Note Status: Finalized Procedure:            Colonoscopy Indications:          Heme positive stool, Iron deficiency anemia Providers:            Manya Silvas, MD Referring MD:         Leonie Douglas. Doy Hutching, MD (Referring MD) Medicines:            Propofol per Anesthesia Complications:        No immediate complications. Procedure:            Pre-Anesthesia Assessment:                       - After reviewing the risks and benefits, the patient                        was deemed in satisfactory condition to undergo the                        procedure.                       After obtaining informed consent, the colonoscope was                        passed under direct vision. Throughout the procedure,                        the patient's blood pressure, pulse, and oxygen                        saturations were monitored continuously. The                        Colonoscope was introduced through the anus and                        advanced to the the cecum, identified by appendiceal                        orifice and ileocecal valve. The colonoscopy was                        somewhat difficult due to significant looping. The                        patient tolerated the procedure well. The quality of                        the bowel preparation was adequate to identify polyps. Findings:      A small polyp was found in the proximal ascending colon. The polyp was       sessile. The polyp was removed with a hot snare. Resection and retrieval       were complete.      A diminutive polyp was found in the ascending colon. The polyp was       sessile. The polyp was removed with a  jumbo cold forceps. Resection and       retrieval were complete.  Internal hemorrhoids were found during endoscopy. The hemorrhoids were       small, medium-sized and Grade I (internal hemorrhoids that do not       prolapse). Impression:           - One small polyp in the proximal ascending colon,                        removed with a hot snare. Resected and retrieved.                       - One diminutive polyp in the ascending colon, removed                        with a jumbo cold forceps. Resected and retrieved.                       - Internal hemorrhoids. Recommendation:       - Await pathology results. Manya Silvas, MD 02/25/2017 9:11:13 AM This report has been signed electronically. Number of Addenda: 0 Note Initiated On: 02/25/2017 8:22 AM Scope Withdrawal Time: 0 hours 8 minutes 19 seconds  Total Procedure Duration: 0 hours 22 minutes 6 seconds       [facility]

## 2017-02-25 NOTE — H&P (Signed)
Primary Care Physician:  [name] Crouch, MD Primary Gastroenterologist:  Dr. Vira Day  Pre-Procedure History & Physical: HPI:  [name] Day is a 76 y.o. male is here for an endoscopy and colonoscopy.   Past Medical History:  Diagnosis Date  . Anemia   . Bipolar 1 disorder ([place])   . Carotid artery occlusion   . Dementia   . Diabetes mellitus without complication ([place])   . Gait abnormality 02/12/2017  . Memory disorder 02/12/2017  . TIA (transient ischemic attack)     Past Surgical History:  Procedure Laterality Date  . NASAL SINUS SURGERY    . PACEMAKER PLACEMENT    . TONSILLECTOMY    . TONSILLECTOMY      Prior to Admission medications   Medication Sig Start Date End Date Taking? Authorizing Provider  AMBULATORY NON FORMULARY MEDICATION BiPAP machine @ 15/12 DX: OSA DX Code: 02/09/17  Yes Wilhelmina Mcardle, MD  amphetamine-dextroamphetamine (ADDERALL) 20 MG tablet Take 1 tablet by mouth daily. 04/21/16  Yes [provider]  aspirin 325 MG tablet Take 325 mg by mouth daily.   Yes [provider]  buPROPion (WELLBUTRIN XL) 150 MG 24 hr tablet  10/15/16  Yes [provider]  escitalopram (LEXAPRO) 10 MG tablet  10/15/16  Yes [provider]  insulin regular human CONCENTRATED (HUMULIN R) 500 UNIT/ML injection Inject 25 Units into the skin. 11/30/15  Yes [provider]  LamoTRIgine (LAMICTAL PO) Take 50 mg by mouth 2 (two) times daily.   Yes [provider]  losartan (COZAAR) 50 MG tablet Take 1 tablet by mouth daily. 04/17/16  Yes [provider]  metFORMIN (GLUCOPHAGE-XR) 500 MG 24 hr tablet Take 2 tablets by mouth 2 (two) times daily. 04/17/16  Yes [provider]  pravastatin (PRAVACHOL) 20 MG tablet Take 1 tablet by mouth daily. 04/07/16  Yes [provider]  sertraline (ZOLOFT) 50 MG tablet Take 1 tablet by mouth daily. 04/17/16  Yes [provider]  tamsulosin (FLOMAX) 0.4 MG CAPS  capsule  11/01/16  Yes [provider]  TANZEUM 50 MG PEN  09/07/16  Yes [provider]  FREESTYLE LITE test strip  11/03/16   [provider]  ibuprofen (ADVIL,MOTRIN) 200 MG tablet Take 400 mg by mouth every 6 (six) hours as needed.    [provider]  liraglutide (VICTOZA) 18 MG/3ML SOPN Inject into the skin. 01/22/17 02/21/17  [provider]    Allergies as of 02/24/2017 - Review Complete 02/24/2017  Allergen Reaction Noted  . Clindamycin/lincomycin  04/24/2016  . Lisinopril    . Penicillins      Family History  Problem Relation Age of Onset  . Family history unknown: Yes    Social History   Social History  . Marital status: Widowed    Spouse name: N/A  . Number of children: 2  . Years of education: 14   Occupational History  . Retired    Social History Main Topics  . Smoking status: Former Research scientist (life sciences)  . Smokeless tobacco: Never Used  . Alcohol use Yes     Comment: occasional  . Drug use: No  . Sexual activity: Not on file   Other Topics Concern  . Not on file   Social History Narrative   Lives with [name] Day   Caffeine use: caffeine free soda daily   Right handed    Review of Systems: See HPI, otherwise negative ROS  Physical Exam: BP (!) 132/57  Pulse 97   Temp 97.7 F (36.5 C) (Tympanic)   Resp 20   Ht 5\' 10"  (1.778 m)   Wt 122.5 kg (270 lb)   SpO2 97%   BMI 38.74 kg/m  General:   Alert,  pleasant and cooperative in NAD Head:  Normocephalic and atraumatic. Neck:  Supple; no masses or thyromegaly. Lungs:  Clear throughout to auscultation.    Heart:  Regular rate and rhythm. Abdomen:  Soft, nontender and nondistended. Normal bowel sounds, without guarding, and without rebound.   Neurologic:  Alert and  oriented x4;  grossly normal neurologically.  Impression/Plan: [name] Day is here for an endoscopy and colonoscopy to be performed for heme positive stool and rectal bleeding.  Risks,  benefits, limitations, and alternatives regarding  endoscopy and colonoscopy have been reviewed with the patient.  Questions have been answered.  All parties agreeable.   [name] Cheers, MD  02/25/2017, 8:21 AM

## 2017-02-25 NOTE — Op Note (Signed)
[facility] Gastroenterology Patient Name: [name] Day Procedure Date: 02/25/2017 8:29 AM MRN: [id number] Account #: [id number] Date of Birth: 09-26-1940 Admit Type: Outpatient Age: 76 Room: [facility] ENDO ROOM 3 Gender: Male Note Status: Finalized Procedure:            Upper GI endoscopy Indications:          Iron deficiency anemia, Heme positive stool Providers:            Manya Silvas, MD Referring MD:         Leonie Douglas. Doy Hutching, MD (Referring MD) Medicines:            Propofol per Anesthesia Complications:        No immediate complications. Procedure:            Pre-Anesthesia Assessment:                       - After reviewing the risks and benefits, the patient                        was deemed in satisfactory condition to undergo the                        procedure.                       After obtaining informed consent, the endoscope was                        passed under direct vision. Throughout the procedure,                        the patient's blood pressure, pulse, and oxygen                        saturations were monitored continuously. The Endoscope                        was introduced through the mouth, and advanced to the                        second part of duodenum. The upper GI endoscopy was                        accomplished without difficulty. The patient tolerated                        the procedure well. Findings:      There were esophageal mucosal changes suspicious for short-segment       Barrett's esophagus present in the lower third of the esophagus. The       maximum longitudinal extent of these mucosal changes was 2 cm in length.       Mucosa was biopsied with a cold forceps for histology. One specimen       bottle was sent to pathology.      Moderate portal hypertensive gastropathy was found in the gastric body       and in the gastric antrum.      The examined duodenum was normal. Impression:           - Esophageal  mucosal changes suspicious for  short-segment Barrett's esophagus. Biopsied.                       - Portal hypertensive gastropathy.                       - Normal examined duodenum. Recommendation:       - Await pathology results. Manya Silvas, MD 02/25/2017 8:43:48 AM This report has been signed electronically. Number of Addenda: 0 Note Initiated On: 02/25/2017 8:29 AM      [facility]

## 2017-02-25 NOTE — Anesthesia Preprocedure Evaluation (Signed)
Anesthesia Evaluation  Patient identified by MRN, date of birth, ID band Patient awake    Reviewed: Allergy & Precautions, NPO status , Patient's Chart, lab work & pertinent test results  History of Anesthesia Complications Negative for: history of anesthetic complications  Airway Mallampati: II  TM Distance: >3 FB Neck ROM: Full    Dental  (+) Implants   Pulmonary sleep apnea and Continuous Positive Airway Pressure Ventilation , neg COPD, former smoker,    breath sounds clear to auscultation- rhonchi (-) wheezing      Cardiovascular (-) angina+ Peripheral Vascular Disease  (-) CAD and (-) Past MI + dysrhythmias + pacemaker  Rhythm:Regular Rate:Normal - Systolic murmurs and - Diastolic murmurs    Neuro/Psych PSYCHIATRIC DISORDERS Anxiety Depression Bipolar Disorder TIA   GI/Hepatic negative GI ROS, Neg liver ROS,   Endo/Other  diabetes, Oral Hypoglycemic Agents, Insulin Dependent  Renal/GU Renal InsufficiencyRenal disease     Musculoskeletal  (+) Arthritis ,   Abdominal (+) + obese,   Peds  Hematology  (+) anemia ,   Anesthesia Other Findings Past Medical History: No date: Anemia No date: Bipolar 1 disorder ([facility]) No date: Carotid artery occlusion No date: Dementia No date: Diabetes mellitus without complication ([place]) 03/03/5008: Gait abnormality 02/12/2017: Memory disorder No date: TIA (transient ischemic attack)   Reproductive/Obstetrics                             Anesthesia Physical Anesthesia Plan  ASA: III  Anesthesia Plan: General   Post-op Pain Management:    Induction: Intravenous  PONV Risk Score and Plan: 1 and Propofol  Airway Management Planned: Natural Airway  Additional Equipment:   Intra-op Plan:   Post-operative Plan:   Informed Consent: I have reviewed the patients History and Physical, chart, labs and discussed the procedure including the risks,  benefits and alternatives for the proposed anesthesia with the patient or authorized representative who has indicated his/her understanding and acceptance.   Dental advisory given  Plan Discussed with: CRNA and Anesthesiologist  Anesthesia Plan Comments:         Anesthesia Quick Evaluation

## 2017-02-25 NOTE — Anesthesia Procedure Notes (Signed)
Performed by: COOK-MARTIN, Katlyn Muldrew Pre-anesthesia Checklist: Patient identified, Emergency Drugs available, Suction available, Patient being monitored and Timeout performed Patient Re-evaluated:Patient Re-evaluated prior to inductionOxygen Delivery Method: Simple face mask Preoxygenation: Pre-oxygenation with 100% oxygen Intubation Type: IV induction Placement Confirmation: positive ETCO2 and CO2 detector       

## 2017-02-25 NOTE — Anesthesia Procedure Notes (Signed)
Procedures

## 2017-02-25 NOTE — Anesthesia Post-op Follow-up Note (Cosign Needed)
Anesthesia QCDR form completed.        

## 2017-02-25 NOTE — Transfer of Care (Signed)
Immediate Anesthesia Transfer of Care Note  Patient: [name] Day  Procedure(s) Performed: Procedure(s): COLONOSCOPY WITH PROPOFOL (N/A) ESOPHAGOGASTRODUODENOSCOPY (EGD) WITH PROPOFOL (N/A)  Patient Location: PACU  Anesthesia Type:General  Level of Consciousness: awake and sedated  Airway & Oxygen Therapy: Patient Spontanous Breathing and Patient connected to face mask oxygen  Post-op Assessment: Report given to RN and Post -op Vital signs reviewed and stable  Post vital signs: Reviewed and stable  Last Vitals:  Vitals:   02/25/17 0740  BP: (!) 132/57  Pulse: 97  Resp: 20  Temp: 36.5 C    Last Pain:  Vitals:   02/25/17 0740  TempSrc: Tympanic         Complications: No apparent anesthesia complications

## 2017-02-26 ENCOUNTER — Encounter: Payer: Self-pay | Admitting: Unknown Physician Specialty

## 2017-02-27 LAB — SURGICAL PATHOLOGY

## 2017-05-12 ENCOUNTER — Ambulatory Visit (INDEPENDENT_AMBULATORY_CARE_PROVIDER_SITE_OTHER): Payer: Medicare Other | Admitting: Vascular Surgery

## 2017-05-12 ENCOUNTER — Ambulatory Visit (INDEPENDENT_AMBULATORY_CARE_PROVIDER_SITE_OTHER): Payer: Medicare Other

## 2017-05-12 ENCOUNTER — Encounter (INDEPENDENT_AMBULATORY_CARE_PROVIDER_SITE_OTHER): Payer: Self-pay | Admitting: Vascular Surgery

## 2017-05-12 VITALS — BP 150/72 | HR 63 | Resp 15 | Ht 69.0 in | Wt 280.0 lb

## 2017-05-12 DIAGNOSIS — E1122 Type 2 diabetes mellitus with diabetic chronic kidney disease: Secondary | ICD-10-CM | POA: Diagnosis not present

## 2017-05-12 DIAGNOSIS — M7989 Other specified soft tissue disorders: Secondary | ICD-10-CM | POA: Diagnosis not present

## 2017-05-12 DIAGNOSIS — N183 Chronic kidney disease, stage 3 unspecified: Secondary | ICD-10-CM

## 2017-05-12 DIAGNOSIS — I872 Venous insufficiency (chronic) (peripheral): Secondary | ICD-10-CM | POA: Diagnosis not present

## 2017-05-12 NOTE — Patient Instructions (Signed)

## 2017-05-12 NOTE — Progress Notes (Signed)
MRN : [id number]   KONSTANTIN LEHNEN is a 76 y.o. ([birth year]) male who presents with chief complaint of  Chief Complaint  Patient presents with  . Follow-up    3-4 month u/s f/u  .  History of Present Illness:   Patient is seen for evaluation of leg pain and leg swelling. 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 Swellings is noted bilaterally. The swelling is associated with pain, discoloration, and ulceration. He describes the ulcerations as blisters which then pop and form ulcers that weep fluid. He has hx of same and has had to be treated with unna boots. He states that the ulcerations with this episode have been present x approx 1 week. The pain and swelling worsens with prolonged dependency and improves with elevation. Describes the pain as a aching. The patient notes that in the morning the legs are significantly improved but they steadily worsened throughout the course of the day. The patient also notes a steady worsening of the discoloration in the ankle and shin area over time. He denies noticing any varicose veins/ telangiectasias. The patient denies any recent changes in medications.   The patient denies claudication symptoms,  symptoms consistent with rest pain, recent episodes of angina, worsening shortness of breath, or orthopnea.   The patient has not been wearing graduated compression. He states that he has been able to elevate his legs nightly while sleeping and use his lymph pump once every 3-4 days but he states he cannot wear compression stocking as they are too laborious to apply.   The patient denies an extensive history of DJD and LS spine disease.  The patient has no had any past angiography, interventions or vascular surgery. The patient denies a history of DVT or PE.  Unsure if he has a family history of varicose veins or venous insufficiency   Current Meds  Medication Sig  . AMBULATORY NON FORMULARY MEDICATION  BiPAP machine @ 15/12 DX: OSA DX Code:  . amphetamine-dextroamphetamine (ADDERALL) 20 MG tablet Take 1 tablet by mouth daily.  Marland Kitchen aspirin 325 MG tablet Take 325 mg by mouth daily.  Marland Kitchen buPROPion (WELLBUTRIN XL) 150 MG 24 hr tablet   . clotrimazole-betamethasone (LOTRISONE) cream   . escitalopram (LEXAPRO) 10 MG tablet   . FREESTYLE LITE test strip   . ibuprofen (ADVIL,MOTRIN) 200 MG tablet Take 400 mg by mouth every 6 (six) hours as needed.  . insulin regular human CONCENTRATED (HUMULIN R) 500 UNIT/ML injection Inject 25 Units into the skin.  . LamoTRIgine (LAMICTAL PO) Take 50 mg by mouth 2 (two) times daily.  Marland Kitchen losartan (COZAAR) 50 MG tablet Take 1 tablet by mouth daily.  . metFORMIN (GLUCOPHAGE-XR) 500 MG 24 hr tablet Take 2 tablets by mouth 2 (two) times daily.  . mupirocin ointment (BACTROBAN) 2 %   . omeprazole (PRILOSEC) 40 MG capsule   . pravastatin (PRAVACHOL) 20 MG tablet Take 1 tablet by mouth daily.  . sertraline (ZOLOFT) 50 MG tablet Take 1 tablet by mouth daily.  . tamsulosin (FLOMAX) 0.4 MG CAPS capsule   . TANZEUM 50 MG PEN     Past Medical History:  Diagnosis Date  . Anemia   . Bipolar 1 disorder ([place])   . Carotid artery occlusion   . Dementia   . Diabetes mellitus without complication ([place])   . Gait abnormality 02/12/2017  . Memory disorder 02/12/2017  . TIA (transient ischemic attack)  Past Surgical History:  Procedure Laterality Date  . COLONOSCOPY WITH PROPOFOL N/A 02/25/2017   Procedure: COLONOSCOPY WITH PROPOFOL;  Surgeon: Manya Silvas, MD;  Location: [facility] ENDOSCOPY;  Service: Endoscopy;  Laterality: N/A;  . ESOPHAGOGASTRODUODENOSCOPY (EGD) WITH PROPOFOL N/A 02/25/2017   Procedure: ESOPHAGOGASTRODUODENOSCOPY (EGD) WITH PROPOFOL;  Surgeon: Manya Silvas, MD;  Location: [facility] ENDOSCOPY;  Service: Endoscopy;  Laterality: N/A;  . NASAL SINUS SURGERY    . PACEMAKER PLACEMENT    . TONSILLECTOMY    . TONSILLECTOMY      Social History Social History    Substance Use Topics  . Smoking status: Former Research scientist (life sciences)  . Smokeless tobacco: Never Used  . Alcohol use Yes     Comment: occasional    Family History Family History  Problem Relation Age of Onset  . Family history unknown: Yes    Allergies  Allergen Reactions  . Clindamycin/Lincomycin     "turns me purple" "turns me orange"  . Lisinopril     REACTION: Cough  . Penicillins      REVIEW OF SYSTEMS (Negative unless checked)  Constitutional: [] Weight loss  [] Fever  [] Chills Cardiac: [] Chest pain   [] Chest pressure   [] Palpitations   [] Shortness of breath when laying flat   [x] Shortness of breath with exertion. Vascular:  [] Pain in legs with walking   [x] Pain in legs at rest  [] History of DVT   [] Phlebitis   [x] Swelling in legs   [] Varicose veins   [] Non-healing ulcers Pulmonary:   [] Uses home oxygen   [] Productive cough   [] Hemoptysis   [] Wheeze  [] COPD   [] Asthma Neurologic:  [] Dizziness   [] Seizures   [x] History of stroke   [] History of TIA  [] Aphasia  []  Facial droop  [] Visual changes   [] Weakness or numbness in arm   [] Weakness or numbness in leg Musculoskeletal:   [] Joint swelling [x]  Arthritis   [] Joint pain   [] Low back pain Hematologic:  [] Easy bruising  [] Easy bleeding   [] Hypercoagulable state   [] Anemic Gastrointestinal:  [] Diarrhea   [] Vomiting  []  Abdominal pain  [] Difficulty swallowing.  []  Blood in stool Genitourinary:  [x] Chronic kidney disease   [] Difficult urination  [] Frequent urination   [] Blood in urine Skin:  [] Rashes   [x] Ulcers  Psychological:  [x] History of anxiety   [x]  History of major depression.  Physical Examination  Vitals:   05/12/17 1531  BP: (!) 150/72  Pulse: 63  Resp: 15  Weight: 280 lb (127 kg)  Height: 5\' 9"  (1.753 m)   Body mass index is 41.35 kg/m. Gen: WD/WN, NAD Head: [place]/AT, No temporalis wasting.  Ear/Nose/Throat: Hearing grossly intact, nares w/o erythema or drainage Eyes: PER, EOMI, sclera nonicteric.   Pulmonary:  Good air  movement, no audible wheezing bilaterally, no use of accessory muscles.  Cardiac: RRR, no JVD  Vessel Right Left  Radial Palpable Palpable  Ulnar    Brachial    Carotid    Femoral    Popliteal    PT Difficulty to Palpable r/t edema Difficulty to Palpable r/t edema    DP +2 Palpable +2 Palpable  +2-3 lower extremity swelling present bilaterally Dermatologic: No rashes noted. Skin of BLE is erythematous and weeping serous fluid. The discoloration starts at the ankle, extends to the upper calf, and is circumferential,  Multiple superficial ulcerations noted to BLE and they are also weeping serosanguinous fluid, changes consistent with stasis dermatitis noted.   CBC Lab Results  Component Value Date   WBC 7.2 04/24/2016  HGB 13.4 04/24/2016   HCT 42.2 04/24/2016   MCV 69.5 (L) 04/24/2016   PLT 103 (L) 04/24/2016    BMET    Component Value Date/Time   NA 139 04/24/2016 1131   K 5.9 (H) 04/24/2016 1131   CL 105 04/24/2016 1131   CO2 26 04/24/2016 1131   GLUCOSE 348 (H) 04/24/2016 1131   BUN 14 04/24/2016 1131   CREATININE 1.11 04/24/2016 1131   CALCIUM 9.2 04/24/2016 1131   GFRNONAA >60 04/24/2016 1131   GFRAA >60 04/24/2016 1131   CrCl cannot be calculated (Patient's most recent lab result is older than the maximum 21 days allowed.).  COAG No results found for: INR, PROTIME  Radiology See Venous U/S under the media tab obtained 05/12/2017, results reviewed by both myself and Dr. Lucky Cowboy  Assessment/Plan  1. Venous insufficiency with ulcerations   I have reviewed my previous  discussion with the patient regarding leg swelling and potential reasons for this problem. Patient is unable to tolerate wearing graduated compression stockings.  Behavioral modification including elevation during the day whenever possible were again discussed and this will continue.  At this time conservative therapy has not alleviated the patient's symptoms of leg pain and swelling and the  patient has developed venous ulcerations.   Venous u/s revealed reflux in both the right and left great saphenous veins and there is no evidence of DVT or superficial thrombophlebitis   Recommend: laser ablation of the right and  left great saphenous veins to eliminate the symptoms of pain and swelling of the lower extremities caused by the severe superficial venous reflux disease.   I have also had a long discussion with the patient regarding venous insufficiency and why it  causes symptoms, specifically venous ulceration . I have discussed with the patient the chronic skin changes that accompany venous insufficiency and the long term sequela such as infection and recurring  ulceration.  Patient will be placed in [employer] which will be changed weekly drainage permitting.  2. Diabetes mellitus  Continue anti-hyperglycemic medications as currently prescribed, these medications have been reviewed and there are no changes at this time. It is very important to have optimal glycemic control, especially in light of the ulcerations as they have potential to become infected or have delayed healing. Hgb A1C/medication management to be completed as previously arranged primary care.   3. CKD stage III  This may be contributing to patient's BLE swelling, will laser ablate bilateral saphenous veins and any residual swelling would likely be related to his CKD and heart disease.   -Red flags and when to present for emergency care or RTC including fever >101.36F, chest pain, shortness of breath, new/worsening/un-resolving symptoms, S/S of a DVT, and cellulitis reviewed with patient at time of visit. Follow up and care instructions discussed and patient verbalized understanding.  I have discussed this patient with Dr. Lucky Cowboy who agrees with this plan of care.    Raynelle Fanning, NP  05/12/2017 4:02 PM

## 2017-05-18 ENCOUNTER — Encounter (INDEPENDENT_AMBULATORY_CARE_PROVIDER_SITE_OTHER): Payer: Self-pay | Admitting: Vascular Surgery

## 2017-05-18 ENCOUNTER — Ambulatory Visit (INDEPENDENT_AMBULATORY_CARE_PROVIDER_SITE_OTHER): Payer: Medicare Other | Admitting: Vascular Surgery

## 2017-05-18 DIAGNOSIS — L97909 Non-pressure chronic ulcer of unspecified part of unspecified lower leg with unspecified severity: Secondary | ICD-10-CM | POA: Diagnosis not present

## 2017-05-18 DIAGNOSIS — I83009 Varicose veins of unspecified lower extremity with ulcer of unspecified site: Secondary | ICD-10-CM | POA: Diagnosis not present

## 2017-05-18 DIAGNOSIS — I872 Venous insufficiency (chronic) (peripheral): Secondary | ICD-10-CM | POA: Diagnosis not present

## 2017-05-18 NOTE — Progress Notes (Signed)
History of Present Illness  There is no documented history at this time  Assessments & Plan   There are no diagnoses linked to this encounter.    Additional instructions  Subjective:  Patient presents with venous ulcer of the Bilateral lower extremity.    Procedure:  3 layer unna wrap was placed Bilateral lower extremity.   Plan:   Follow up in one week.  

## 2017-05-25 ENCOUNTER — Ambulatory Visit (INDEPENDENT_AMBULATORY_CARE_PROVIDER_SITE_OTHER): Payer: Medicare Other | Admitting: Vascular Surgery

## 2017-05-25 ENCOUNTER — Encounter (INDEPENDENT_AMBULATORY_CARE_PROVIDER_SITE_OTHER): Payer: Self-pay

## 2017-05-25 VITALS — BP 147/69 | HR 87 | Resp 16 | Wt 293.0 lb

## 2017-05-25 DIAGNOSIS — I83009 Varicose veins of unspecified lower extremity with ulcer of unspecified site: Secondary | ICD-10-CM

## 2017-05-25 DIAGNOSIS — L97909 Non-pressure chronic ulcer of unspecified part of unspecified lower leg with unspecified severity: Secondary | ICD-10-CM | POA: Diagnosis not present

## 2017-05-25 NOTE — Progress Notes (Signed)
History of Present Illness  There is no documented history at this time  Assessments & Plan   There are no diagnoses linked to this encounter.    Additional instructions  Subjective:  Patient presents with venous ulcer of the Bilateral lower extremity.    Procedure:  3 layer unna wrap was placed Bilateral lower extremity.   Plan:   Follow up in one week.  

## 2017-06-01 ENCOUNTER — Encounter (INDEPENDENT_AMBULATORY_CARE_PROVIDER_SITE_OTHER): Payer: Self-pay | Admitting: Vascular Surgery

## 2017-06-01 ENCOUNTER — Ambulatory Visit (INDEPENDENT_AMBULATORY_CARE_PROVIDER_SITE_OTHER): Payer: Medicare Other | Admitting: Vascular Surgery

## 2017-06-01 VITALS — BP 143/62 | HR 86 | Resp 18 | Ht 71.0 in | Wt 297.0 lb

## 2017-06-01 DIAGNOSIS — I890 Lymphedema, not elsewhere classified: Secondary | ICD-10-CM | POA: Diagnosis not present

## 2017-06-01 NOTE — Progress Notes (Signed)
History of Present Illness  There is no documented history at this time  Assessments & Plan   There are no diagnoses linked to this encounter.    Additional instructions  Subjective:  Patient presents with venous ulcer of the Bilateral lower extremity.    Procedure:  3 layer unna wrap was placed Bilateral lower extremity.   Plan:   Follow up in one week.  

## 2017-06-08 ENCOUNTER — Encounter (INDEPENDENT_AMBULATORY_CARE_PROVIDER_SITE_OTHER): Payer: Self-pay

## 2017-06-08 ENCOUNTER — Ambulatory Visit (INDEPENDENT_AMBULATORY_CARE_PROVIDER_SITE_OTHER): Payer: Medicare Other | Admitting: Vascular Surgery

## 2017-06-08 VITALS — BP 144/69 | HR 81 | Resp 16 | Wt 293.0 lb

## 2017-06-08 DIAGNOSIS — I890 Lymphedema, not elsewhere classified: Secondary | ICD-10-CM

## 2017-06-08 NOTE — Progress Notes (Signed)
History of Present Illness  There is no documented history at this time  Assessments & Plan   There are no diagnoses linked to this encounter.    Additional instructions  Subjective:  Patient presents with venous ulcer of the Bilateral lower extremity.    Procedure:  3 layer unna wrap was placed Bilateral lower extremity.   Plan:   Follow up in one week.  

## 2017-06-12 ENCOUNTER — Ambulatory Visit (INDEPENDENT_AMBULATORY_CARE_PROVIDER_SITE_OTHER): Payer: Medicare Other | Admitting: Vascular Surgery

## 2017-06-12 ENCOUNTER — Encounter (INDEPENDENT_AMBULATORY_CARE_PROVIDER_SITE_OTHER): Payer: Self-pay | Admitting: Vascular Surgery

## 2017-06-12 VITALS — BP 147/66 | HR 82 | Resp 18 | Ht 71.0 in | Wt 293.0 lb

## 2017-06-12 DIAGNOSIS — N183 Chronic kidney disease, stage 3 unspecified: Secondary | ICD-10-CM

## 2017-06-12 DIAGNOSIS — E785 Hyperlipidemia, unspecified: Secondary | ICD-10-CM | POA: Diagnosis not present

## 2017-06-12 DIAGNOSIS — I83893 Varicose veins of bilateral lower extremities with other complications: Secondary | ICD-10-CM

## 2017-06-12 DIAGNOSIS — E1122 Type 2 diabetes mellitus with diabetic chronic kidney disease: Secondary | ICD-10-CM

## 2017-06-12 DIAGNOSIS — I4430 Unspecified atrioventricular block: Secondary | ICD-10-CM | POA: Diagnosis not present

## 2017-06-12 NOTE — Patient Instructions (Signed)
Varicose Vein Surgery, Care After Refer to this sheet in the next few weeks. These instructions provide you with information about caring for yourself after your procedure. Your health care provider may also give you more specific instructions. Your treatment has been planned according to current medical practices, but problems sometimes occur. Call your health care provider if you have any problems or questions after your procedure. What can I expect after the procedure? After your procedure, it is typical to have the following:  Swelling.  Bruising.  Soreness.  Mild skin discoloration.  Slight bleeding at incision sites.  Follow these instructions at home:  Take medicines only as directed by your health care provider.  Wear compression stockings as directed by your health care provider. These stockings help to prevent blood clots and reduce swelling in your legs.  There are many different ways to close and cover an incision, including stitches (sutures), skin glue, and adhesive strips. Follow your health care provider's instructions on: ? Incision care. ? Bandage (dressing) changes and removal. ? Incision closure removal.  Wear loose-fitting clothing.  Get regular daily exercise. Walk or ride a stationary bike daily or as directed by your health care provider.  Ask your health care provider when you can return to work. This may depend on the type of work you do.  Be patient with your recovery. It can take up to 4 weeks to get back to your usual activities. Contact a health care provider if:  You have a fever.  You have drainage, redness, swelling, or pain at an incision site.  You develop a cough. Get help right away if:  You pass out.  You have very bad pain in your leg.  You have leg pain that gets worse when you walk.  You have redness or swelling in your leg that is getting worse.  You have trouble breathing.  You cough up blood. This information is not  intended to replace advice given to you by your health care provider. Make sure you discuss any questions you have with your health care provider. Document Released: 04/21/2014 Document Revised: 01/24/2016 Document Reviewed: 01/25/2014 Elsevier Interactive Patient Education  2018 [employer]vier Inc[employer]  

## 2017-06-12 NOTE — Assessment & Plan Note (Signed)
Recommend  I have reviewed my previous  discussion with the patient regarding  varicose veins and why they cause symptoms. Patient will continue  wearing graduated compression stockings class 1 on a daily basis, beginning first thing in the morning and removing them in the evening or using UNNA Boots    In addition, behavioral modification including elevation during the day was again discussed and this will continue.  The patient has utilized over the counter pain medications and has been exercising.  However, at this time conservative therapy has not alleviated the patient's symptoms of leg pain and swelling  Recommend: laser ablation of the right and  left great saphenous veins to eliminate the symptoms of pain and swelling of the lower extremities caused by the severe superficial venous reflux disease.

## 2017-06-12 NOTE — Progress Notes (Signed)
MRN : [id number]  [name] Day is a 76 y.o. (1940/12/30) male who presents with chief complaint of  Chief Complaint  Patient presents with  . Follow-up    Discuss laser procedure  .  History of Present Illness: Patient returns today in follow up of leg swelling.  He remains in [employer].  He has tried to get compression stockings on an off, but it is very difficult.  Previously he has been noted to have bilateral GSV reflux.       Past Medical History:  Diagnosis Date  . Anemia   . Bipolar 1 disorder ([place])   . Carotid artery occlusion   . Dementia   . Diabetes mellitus without complication ([place])   . TIA (transient ischemic attack)          Past Surgical History:  Procedure Laterality Date  . NASAL SINUS SURGERY    . PACEMAKER PLACEMENT    . TONSILLECTOMY    . TONSILLECTOMY      Family History No history of bleeding disorders, clotting disorders, autoimmune disease, or aneurysms  Social History        Social History   Substance Use Topics   . Smoking status: Former Research scientist (life sciences)   . Smokeless tobacco: Never Used   . Alcohol use Yes     Comment: occasional   No IV drug use       Allergies  Allergen Reactions  . Clindamycin/Lincomycin     "turns me purple" "turns me orange"  . Lisinopril     REACTION: Cough  . Penicillins           Current Outpatient Prescriptions  Medication Sig Dispense Refill  . AMBULATORY NON FORMULARY MEDICATION BiPAP machine @ 15/12 DX: OSA DX Code: 1 each 0  . amphetamine-dextroamphetamine (ADDERALL) 20 MG tablet Take 1 tablet by mouth daily.    Marland Kitchen aspirin 325 MG tablet Take 325 mg by mouth daily.    Marland Kitchen buPROPion (WELLBUTRIN XL) 150 MG 24 hr tablet     . escitalopram (LEXAPRO) 10 MG tablet     . FREESTYLE LITE test strip     . ibuprofen (ADVIL,MOTRIN) 200 MG tablet Take 400 mg by mouth every 6 (six) hours as needed.    . insulin regular human CONCENTRATED (HUMULIN R) 500 UNIT/ML  injection Inject 25 Units into the skin.    Marland Kitchen liraglutide (VICTOZA) 18 MG/3ML SOPN Inject into the skin.    Marland Kitchen losartan (COZAAR) 50 MG tablet Take 1 tablet by mouth daily.    . metFORMIN (GLUCOPHAGE-XR) 500 MG 24 hr tablet Take 2 tablets by mouth 2 (two) times daily.    . pravastatin (PRAVACHOL) 20 MG tablet Take 1 tablet by mouth daily.    . sertraline (ZOLOFT) 50 MG tablet Take 1 tablet by mouth daily.    . tamsulosin (FLOMAX) 0.4 MG CAPS capsule     . TANZEUM 50 MG PEN      No current facility-administered medications for this visit.       REVIEW OF SYSTEMS (Negative unless checked)  Constitutional: [] Weight loss  [] Fever  [] Chills Cardiac: [] Chest pain   [] Chest pressure   [x] Palpitations   [] Shortness of breath when laying flat   [] Shortness of breath at rest   [x] Shortness of breath with exertion. Vascular:  [] Pain in legs with walking   [x] Pain in legs at rest   [] Pain in legs when laying flat   [] Claudication   [] Pain in feet when walking  []   Pain in feet at rest  [] Pain in feet when laying flat   [] History of DVT   [] Phlebitis   [x] Swelling in legs   [] Varicose veins   [] Non-healing ulcers Pulmonary:   [] Uses home oxygen   [] Productive cough   [] Hemoptysis   [] Wheeze  [] COPD   [] Asthma Neurologic:  [] Dizziness  [] Blackouts   [] Seizures   [x] History of stroke   [] History of TIA  [] Aphasia   [] Temporary blindness   [] Dysphagia   [] Weakness or numbness in arms   [] Weakness or numbness in legs Musculoskeletal:  [x] Arthritis   [] Joint swelling   [] Joint pain   [] Low back pain Hematologic:  [] Easy bruising  [] Easy bleeding   [] Hypercoagulable state   [] Anemic  [] Hepatitis Gastrointestinal:  [] Blood in stool   [] Vomiting blood  [] Gastroesophageal reflux/heartburn   [] Abdominal pain Genitourinary:  [x] Chronic kidney disease   [] Difficult urination  [] Frequent urination  [] Burning with urination   [] Hematuria Skin:  [] Rashes   [] Ulcers   [] Wounds Psychological:   [x] History of anxiety   [x]  History of major depression.   Physical Examination  BP (!) 147/66 (BP Location: Right Arm)   Pulse 82   Resp 18   Ht 5\' 11"  (1.803 m)   Wt 132.9 kg (293 lb)   BMI 40.87 kg/m  Gen:  WD/WN, NAD Head: [place]/AT, No temporalis wasting. Ear/Nose/Throat: Hearing grossly intact, nares w/o erythema or drainage, trachea midline Eyes: Conjunctiva clear. Sclera non-icteric Neck: Supple.  No JVD.  Pulmonary:  Good air movement, no use of accessory muscles.  Cardiac: RRR, normal S1, S2 Vascular:  Vessel Right Left  Radial Palpable Palpable                                    Musculoskeletal: M/S 5/5 throughout.  No deformity or atrophy. 2+ BLE edema. Neurologic: Sensation grossly intact in extremities.  Symmetrical.  Speech is fluent.  Psychiatric: Judgment intact, Mood & affect appropriate for pt's clinical situation. Dermatologic: No rashes or ulcers noted.  No cellulitis or open wounds.       Labs No results found for this or any previous visit (from the past 2160 hour(s)).  Radiology No results found.    Assessment/Plan  CKD (chronic kidney disease), stage III Likely contributes to LE swelling  Diabetes (HCC) blood glucose control important in reducing the progression of atherosclerotic disease. Also, involved in wound healing. On appropriate medications.   AV heart block S/p pacer   Hyperlipidemia lipid control important in reducing the progression of atherosclerotic disease. Continue statin therapy  Varicose veins of leg with swelling, bilateral Recommend  I have reviewed my previous  discussion with the patient regarding  varicose veins and why they cause symptoms. Patient will continue  wearing graduated compression stockings class 1 on a daily basis, beginning first thing in the morning and removing them in the evening or using UNNA Boots    In addition, behavioral modification including elevation during the day was  again discussed and this will continue.  The patient has utilized over the counter pain medications and has been exercising.  However, at this time conservative therapy has not alleviated the patient's symptoms of leg pain and swelling  Recommend: laser ablation of the right and  left great saphenous veins to eliminate the symptoms of pain and swelling of the lower extremities caused by the severe superficial venous reflux disease.     Leotis Pain,  MD  06/12/2017 2:02 PM    This note was created with Dragon medical transcription system.  Any errors from dictation are purely unintentional

## 2017-06-15 ENCOUNTER — Ambulatory Visit (INDEPENDENT_AMBULATORY_CARE_PROVIDER_SITE_OTHER): Payer: Medicare Other | Admitting: Vascular Surgery

## 2017-06-15 ENCOUNTER — Encounter (INDEPENDENT_AMBULATORY_CARE_PROVIDER_SITE_OTHER): Payer: Self-pay

## 2017-06-15 VITALS — Resp 17 | Ht 71.0 in | Wt 293.0 lb

## 2017-06-15 DIAGNOSIS — E785 Hyperlipidemia, unspecified: Secondary | ICD-10-CM

## 2017-06-15 NOTE — Progress Notes (Signed)
History of Present Illness  There is no documented history at this time  Assessments & Plan   There are no diagnoses linked to this encounter.    Additional instructions  Subjective:  Patient presents with venous ulcer of the Bilateral lower extremity.    Procedure:  3 layer unna wrap was placed Bilateral lower extremity.   Plan:   Follow up in one week.  

## 2017-06-16 ENCOUNTER — Encounter (INDEPENDENT_AMBULATORY_CARE_PROVIDER_SITE_OTHER): Payer: Medicare Other

## 2017-06-22 ENCOUNTER — Encounter (INDEPENDENT_AMBULATORY_CARE_PROVIDER_SITE_OTHER): Payer: Self-pay

## 2017-06-22 ENCOUNTER — Ambulatory Visit (INDEPENDENT_AMBULATORY_CARE_PROVIDER_SITE_OTHER): Payer: Medicare Other | Admitting: Vascular Surgery

## 2017-06-22 VITALS — Resp 16 | Ht 71.0 in | Wt 291.0 lb

## 2017-06-22 DIAGNOSIS — I890 Lymphedema, not elsewhere classified: Secondary | ICD-10-CM

## 2017-06-22 NOTE — Progress Notes (Signed)
History of Present Illness  There is no documented history at this time  Assessments & Plan   There are no diagnoses linked to this encounter.    Additional instructions  Subjective:  Patient presents with venous ulcer of the Bilateral lower extremity.    Procedure:  3 layer unna wrap was placed Bilateral lower extremity.   Plan:   Follow up in one week.  

## 2017-06-29 ENCOUNTER — Encounter (INDEPENDENT_AMBULATORY_CARE_PROVIDER_SITE_OTHER): Payer: Self-pay

## 2017-06-29 ENCOUNTER — Ambulatory Visit (INDEPENDENT_AMBULATORY_CARE_PROVIDER_SITE_OTHER): Payer: Medicare Other | Admitting: Vascular Surgery

## 2017-06-29 VITALS — BP 129/60 | HR 86 | Resp 16 | Wt 290.0 lb

## 2017-06-29 DIAGNOSIS — I83009 Varicose veins of unspecified lower extremity with ulcer of unspecified site: Secondary | ICD-10-CM

## 2017-06-29 DIAGNOSIS — L97909 Non-pressure chronic ulcer of unspecified part of unspecified lower leg with unspecified severity: Secondary | ICD-10-CM | POA: Diagnosis not present

## 2017-06-29 NOTE — Progress Notes (Signed)
History of Present Illness  There is no documented history at this time  Assessments & Plan   There are no diagnoses linked to this encounter.    Additional instructions  Subjective:  Patient presents with venous ulcer of the Bilateral lower extremity.    Procedure:  3 layer unna wrap was placed Bilateral lower extremity.   Plan:   Follow up in one week.  

## 2017-07-06 ENCOUNTER — Encounter (INDEPENDENT_AMBULATORY_CARE_PROVIDER_SITE_OTHER): Payer: Self-pay | Admitting: Vascular Surgery

## 2017-07-06 ENCOUNTER — Ambulatory Visit (INDEPENDENT_AMBULATORY_CARE_PROVIDER_SITE_OTHER): Payer: Medicare Other | Admitting: Vascular Surgery

## 2017-07-06 VITALS — BP 138/66 | HR 74 | Resp 16 | Wt 287.0 lb

## 2017-07-06 DIAGNOSIS — E1122 Type 2 diabetes mellitus with diabetic chronic kidney disease: Secondary | ICD-10-CM | POA: Diagnosis not present

## 2017-07-06 DIAGNOSIS — I890 Lymphedema, not elsewhere classified: Secondary | ICD-10-CM

## 2017-07-06 DIAGNOSIS — L97909 Non-pressure chronic ulcer of unspecified part of unspecified lower leg with unspecified severity: Secondary | ICD-10-CM

## 2017-07-06 DIAGNOSIS — I83009 Varicose veins of unspecified lower extremity with ulcer of unspecified site: Secondary | ICD-10-CM | POA: Diagnosis not present

## 2017-07-06 NOTE — Progress Notes (Signed)
Subjective:    Patient ID: [name] Day, male    DOB: 1941/01/12, 76 y.o.   MRN: [id number] Chief Complaint  Patient presents with  . Follow-up    unna check   Patient presents for a monthly Unna boot therapy follow-up. The patient presents without complaint. He continues to elevate his legs as much as possible. The patient also has a lymphedema pump which he continues to use at least once a day for an hour each time. Patient has noticed a progressive improvement in his bilateral lower extremity edema and ulceration. Patient denies any fever, nausea or vomiting.   Review of Systems  Constitutional: Negative.   HENT: Negative.   Eyes: Negative.   Respiratory: Negative.   Cardiovascular: Positive for leg swelling.  Gastrointestinal: Negative.   Endocrine: Negative.   Genitourinary: Negative.   Musculoskeletal: Negative.   Skin: Positive for wound.  Allergic/Immunologic: Negative.   Neurological: Negative.   Hematological: Negative.   Psychiatric/Behavioral: Negative.       Objective:   Physical Exam  Constitutional: He is oriented to person, place, and time. He appears well-developed and well-nourished. No distress.  HENT:  Head: Normocephalic and atraumatic.  Eyes: Conjunctivae are normal. Pupils are equal, round, and reactive to light.  Neck: Normal range of motion.  Cardiovascular: Normal rate, regular rhythm, normal heart sounds and intact distal pulses.  Pulmonary/Chest: Effort normal and breath sounds normal.  Musculoskeletal: Normal range of motion. He exhibits no edema (there is minimal edema to the bilateral lower extremity.).  Neurological: He is alert and oriented to person, place, and time.  Skin: He is not diaphoretic.  The patient's ulcerations have healed. The patient does have moderate to severe stasis dermatitis. Skin is intact is no cellulitis  Psychiatric: He has a normal mood and affect. His behavior is normal. Judgment and thought content normal.    Vitals reviewed.  BP 138/66 (BP Location: Right Arm)   Pulse 74   Resp 16   Wt 287 lb (130.2 kg)   BMI 40.03 kg/m   Past Medical History:  Diagnosis Date  . Anemia   . Bipolar 1 disorder ([place])   . Carotid artery occlusion   . Dementia   . Diabetes mellitus without complication ([place])   . Gait abnormality 02/12/2017  . Memory disorder 02/12/2017  . TIA (transient ischemic attack)    Social History   Socioeconomic History  . Marital status: Widowed    Spouse name: Not on file  . Number of children: 2  . Years of education: 63  . Highest education level: Not on file  Social Needs  . Financial resource strain: Not on file  . Food insecurity - worry: Not on file  . Food insecurity - inability: Not on file  . Transportation needs - medical: Not on file  . Transportation needs - non-medical: Not on file  Occupational History  . Occupation: Retired  Tobacco Use  . Smoking status: Former Research scientist (life sciences)  . Smokeless tobacco: Never Used  Substance and Sexual Activity  . Alcohol use: Yes    Comment: occasional  . Drug use: No  . Sexual activity: Not on file  Other Topics Concern  . Not on file  Social History Narrative   Lives with [name]   Caffeine use: caffeine free soda daily   Right handed   Past Surgical History:  Procedure Laterality Date  . NASAL SINUS SURGERY    . PACEMAKER PLACEMENT    . TONSILLECTOMY    .  TONSILLECTOMY     Family History  Family history unknown: Yes   Allergies  Allergen Reactions  . Clindamycin/Lincomycin     "turns me purple" "turns me orange"  . Lisinopril     REACTION: Cough  . Penicillins       Assessment & Plan:  Patient presents for a monthly Unna boot therapy follow-up. The patient presents without complaint. He continues to elevate his legs as much as possible. The patient also has a lymphedema pump which he continues to use at least once a day for an hour each time. Patient has noticed a progressive improvement in his  bilateral lower extremity edema and ulceration. Patient denies any fever, nausea or vomiting.  1. Lymphedema - Improved Patient presents today for a monthly Unna boot therapy follow-up. There is improvement to the bilateral lower extremity edema. The patient's ulcerations have healed At this time, I will transition him into complete medical grade 1 compression stocking The patient is to continue elevating his legs as much as possible The patient will continue to use his lymphedema pump at least twice a day for an hour each time The patient is scheduled to undergo laser ablation to the bilateral lower extremity in December I had a long discussion with the patient discussing the procedure, risks and benefits. He continues to want to proceed with the procedure The patient if he should notice any increasing swelling or ulcer development. Expresses his understanding  2. Venous ulcer ([place]) - stable As above  3. Type 2 diabetes mellitus with chronic kidney disease, without long-term current use of insulin, unspecified CKD stage (HCC) - Stable Encouraged good control as its slows the progression of atherosclerotic disease  Current Outpatient Medications on File Prior to Visit  Medication Sig Dispense Refill  . AMBULATORY NON FORMULARY MEDICATION BiPAP machine @ 15/12 DX: OSA DX Code: 1 each 0  . amphetamine-dextroamphetamine (ADDERALL) 20 MG tablet Take 1 tablet by mouth daily.    Marland Kitchen aspirin 325 MG tablet Take 325 mg by mouth daily.    Marland Kitchen buPROPion (WELLBUTRIN XL) 150 MG 24 hr tablet     . clotrimazole-betamethasone (LOTRISONE) cream     . escitalopram (LEXAPRO) 10 MG tablet     . FREESTYLE LITE test strip     . ibuprofen (ADVIL,MOTRIN) 200 MG tablet Take 400 mg by mouth every 6 (six) hours as needed.    . insulin aspart (NOVOLOG FLEXPEN) 100 UNIT/ML FlexPen Take    . insulin regular human CONCENTRATED (HUMULIN R) 500 UNIT/ML injection Inject 25 Units into the skin.    . LamoTRIgine (LAMICTAL  PO) Take 50 mg by mouth 2 (two) times daily.    Marland Kitchen losartan (COZAAR) 50 MG tablet Take 1 tablet by mouth daily.    . metFORMIN (GLUCOPHAGE-XR) 500 MG 24 hr tablet Take 2 tablets by mouth 2 (two) times daily.    . mupirocin ointment (BACTROBAN) 2 %     . omeprazole (PRILOSEC) 40 MG capsule     . pravastatin (PRAVACHOL) 20 MG tablet Take 1 tablet by mouth daily.    . sertraline (ZOLOFT) 50 MG tablet Take 1 tablet by mouth daily.    . tamsulosin (FLOMAX) 0.4 MG CAPS capsule     . TANZEUM 50 MG PEN     . liraglutide (VICTOZA) 18 MG/3ML SOPN Inject into the skin.     No current facility-administered medications on file prior to visit.     There are no Patient Instructions on file for this  visit. No Follow-up on file.   Avaleen Brownley A Demetress Tift, PA-C

## 2017-07-14 ENCOUNTER — Ambulatory Visit (INDEPENDENT_AMBULATORY_CARE_PROVIDER_SITE_OTHER): Payer: Medicare Other | Admitting: Vascular Surgery

## 2017-07-14 ENCOUNTER — Telehealth (INDEPENDENT_AMBULATORY_CARE_PROVIDER_SITE_OTHER): Payer: Self-pay | Admitting: Internal Medicine

## 2017-07-14 VITALS — HR 75 | Resp 17 | Ht 72.0 in | Wt 279.0 lb

## 2017-07-14 DIAGNOSIS — L97909 Non-pressure chronic ulcer of unspecified part of unspecified lower leg with unspecified severity: Secondary | ICD-10-CM | POA: Diagnosis not present

## 2017-07-14 DIAGNOSIS — I83009 Varicose veins of unspecified lower extremity with ulcer of unspecified site: Secondary | ICD-10-CM | POA: Diagnosis not present

## 2017-07-14 NOTE — Telephone Encounter (Signed)
Pt called and stated that he finished with unna boots/wraps last week and now he states that he has weeping and what not and thinks that we need to start up again. Please advise, thank you!  Call pt @ [phone]

## 2017-07-14 NOTE — Telephone Encounter (Signed)
Can you call the patient and offer him a 4:00 appointment time, as this is the only hour that we can offer him a spot. And please also advise the patient that he will continue the wraps for the next three weeks and follow up with Maudie Mercury on the fourth visit. Thanks.

## 2017-07-14 NOTE — Telephone Encounter (Signed)
Can be placed in the nurse's schedule to be wrapped with a follow-up with me in 1 month.

## 2017-07-20 ENCOUNTER — Ambulatory Visit (INDEPENDENT_AMBULATORY_CARE_PROVIDER_SITE_OTHER): Payer: Medicare Other | Admitting: Vascular Surgery

## 2017-07-20 ENCOUNTER — Encounter (INDEPENDENT_AMBULATORY_CARE_PROVIDER_SITE_OTHER): Payer: Self-pay

## 2017-07-20 VITALS — BP 152/68 | HR 84 | Resp 16 | Wt 287.0 lb

## 2017-07-20 DIAGNOSIS — L97909 Non-pressure chronic ulcer of unspecified part of unspecified lower leg with unspecified severity: Secondary | ICD-10-CM

## 2017-07-20 DIAGNOSIS — I83009 Varicose veins of unspecified lower extremity with ulcer of unspecified site: Secondary | ICD-10-CM | POA: Diagnosis not present

## 2017-07-20 NOTE — Progress Notes (Signed)
History of Present Illness  There is no documented history at this time  Assessments & Plan   There are no diagnoses linked to this encounter.    Additional instructions  Subjective:  Patient presents with venous ulcer of the Bilateral lower extremity.    Procedure:  3 layer unna wrap was placed Bilateral lower extremity.   Plan:   Follow up in one week.  

## 2017-07-27 ENCOUNTER — Ambulatory Visit (INDEPENDENT_AMBULATORY_CARE_PROVIDER_SITE_OTHER): Payer: Medicare Other | Admitting: Vascular Surgery

## 2017-07-27 ENCOUNTER — Encounter (INDEPENDENT_AMBULATORY_CARE_PROVIDER_SITE_OTHER): Payer: Self-pay

## 2017-07-27 VITALS — BP 153/76 | HR 88 | Resp 17 | Ht 69.0 in | Wt 278.0 lb

## 2017-07-27 DIAGNOSIS — L97909 Non-pressure chronic ulcer of unspecified part of unspecified lower leg with unspecified severity: Secondary | ICD-10-CM

## 2017-07-27 DIAGNOSIS — I83009 Varicose veins of unspecified lower extremity with ulcer of unspecified site: Secondary | ICD-10-CM

## 2017-07-27 DIAGNOSIS — I872 Venous insufficiency (chronic) (peripheral): Secondary | ICD-10-CM | POA: Diagnosis not present

## 2017-07-27 NOTE — Progress Notes (Signed)
History of Present Illness  There is no documented history at this time  Assessments & Plan   There are no diagnoses linked to this encounter.    Additional instructions  Subjective:  Patient presents with venous ulcer of the Bilateral lower extremity.    Procedure:  3 layer unna wrap was placed Bilateral lower extremity.   Plan:   Follow up in one week.  

## 2017-08-03 ENCOUNTER — Encounter (INDEPENDENT_AMBULATORY_CARE_PROVIDER_SITE_OTHER): Payer: Self-pay

## 2017-08-03 ENCOUNTER — Ambulatory Visit (INDEPENDENT_AMBULATORY_CARE_PROVIDER_SITE_OTHER): Payer: Medicare Other | Admitting: Vascular Surgery

## 2017-08-03 VITALS — BP 134/74 | HR 88 | Resp 16 | Wt 285.0 lb

## 2017-08-03 DIAGNOSIS — L97909 Non-pressure chronic ulcer of unspecified part of unspecified lower leg with unspecified severity: Secondary | ICD-10-CM

## 2017-08-03 DIAGNOSIS — I83009 Varicose veins of unspecified lower extremity with ulcer of unspecified site: Secondary | ICD-10-CM | POA: Diagnosis not present

## 2017-08-03 NOTE — Progress Notes (Signed)
History of Present Illness  There is no documented history at this time  Assessments & Plan   There are no diagnoses linked to this encounter.    Additional instructions  Subjective:  Patient presents with venous ulcer of the Bilateral lower extremity.    Procedure:  3 layer unna wrap was placed Bilateral lower extremity.   Plan:   Follow up in one week.  

## 2017-08-07 ENCOUNTER — Other Ambulatory Visit (INDEPENDENT_AMBULATORY_CARE_PROVIDER_SITE_OTHER): Payer: Medicare Other | Admitting: Vascular Surgery

## 2017-08-11 ENCOUNTER — Encounter (INDEPENDENT_AMBULATORY_CARE_PROVIDER_SITE_OTHER): Payer: Medicare Other

## 2017-08-13 ENCOUNTER — Ambulatory Visit (INDEPENDENT_AMBULATORY_CARE_PROVIDER_SITE_OTHER): Payer: Medicare Other | Admitting: Vascular Surgery

## 2017-08-13 ENCOUNTER — Encounter (INDEPENDENT_AMBULATORY_CARE_PROVIDER_SITE_OTHER): Payer: Self-pay

## 2017-08-13 VITALS — BP 139/66 | HR 85 | Resp 16 | Wt 279.0 lb

## 2017-08-13 DIAGNOSIS — I83893 Varicose veins of bilateral lower extremities with other complications: Secondary | ICD-10-CM | POA: Diagnosis not present

## 2017-08-13 DIAGNOSIS — I890 Lymphedema, not elsewhere classified: Secondary | ICD-10-CM

## 2017-08-14 NOTE — Progress Notes (Signed)
History of Present Illness  There is no documented history at this time  Assessments & Plan   There are no diagnoses linked to this encounter.    Additional instructions  Subjective:  Patient presents with venous ulcer of the Bilateral lower extremity.    Procedure:  3 layer unna wrap was placed Bilateral lower extremity.   Plan:   Follow up in one week.  

## 2017-08-17 ENCOUNTER — Ambulatory Visit: Payer: Medicare Other | Admitting: Neurology

## 2017-08-17 ENCOUNTER — Telehealth: Payer: Self-pay | Admitting: Neurology

## 2017-08-17 NOTE — Telephone Encounter (Signed)
This patient cancelled the same day of a RV appointment. 

## 2017-08-18 ENCOUNTER — Encounter: Payer: Self-pay | Admitting: Neurology

## 2017-08-19 ENCOUNTER — Ambulatory Visit: Payer: Medicare Other | Admitting: Neurology

## 2017-08-19 ENCOUNTER — Telehealth: Payer: Self-pay | Admitting: Neurology

## 2017-08-19 NOTE — Telephone Encounter (Signed)
This patient has not shown up for a revisit appointment today.  The patient also has not gotten the carotid Doppler study as ordered.

## 2017-08-20 ENCOUNTER — Encounter: Payer: Self-pay | Admitting: Neurology

## 2017-08-28 ENCOUNTER — Encounter (INDEPENDENT_AMBULATORY_CARE_PROVIDER_SITE_OTHER): Payer: Self-pay

## 2017-08-28 NOTE — Progress Notes (Signed)
History of Present Illness  There is no documented history at this time  Assessments & Plan   There are no diagnoses linked to this encounter.    Additional instructions  Subjective:  Patient presents with venous ulcer of the Bilateral lower extremity.    Procedure:  3 layer unna wrap was placed Bilateral lower extremity.   Plan:   Follow up in one week.  

## 2017-09-07 ENCOUNTER — Encounter (INDEPENDENT_AMBULATORY_CARE_PROVIDER_SITE_OTHER): Payer: Self-pay | Admitting: Vascular Surgery

## 2017-09-07 ENCOUNTER — Ambulatory Visit (INDEPENDENT_AMBULATORY_CARE_PROVIDER_SITE_OTHER): Payer: Medicare Other | Admitting: Vascular Surgery

## 2017-09-07 VITALS — BP 140/66 | HR 93 | Resp 16 | Wt 273.0 lb

## 2017-09-07 DIAGNOSIS — L97909 Non-pressure chronic ulcer of unspecified part of unspecified lower leg with unspecified severity: Secondary | ICD-10-CM

## 2017-09-07 DIAGNOSIS — N183 Chronic kidney disease, stage 3 unspecified: Secondary | ICD-10-CM

## 2017-09-07 DIAGNOSIS — I890 Lymphedema, not elsewhere classified: Secondary | ICD-10-CM | POA: Diagnosis not present

## 2017-09-07 DIAGNOSIS — I83009 Varicose veins of unspecified lower extremity with ulcer of unspecified site: Secondary | ICD-10-CM

## 2017-09-07 NOTE — Progress Notes (Signed)
Subjective:    Patient ID: [name] Day, male    DOB: 08-06-1941, 77 y.o.   MRN: [id number] Chief Complaint  Patient presents with  . Follow-up   Patient presents for a monthly wound check.  The patient states that he has taken himself that it Unna boots on Friday.  He told the nurses in triage that he has not been wearing his Unna boots for approximately 2 weeks.  The patient states he continues to elevate his legs and use his lymphedema pump once a day. The patient has purchased a pair [employer] which she has started to wear.  The patient feels that his legs look better.  The patient denies any erythema to the bilateral lower extremity.  Patient denies any pain to the bilateral lower extremity.  The patient denies any fever, nausea vomiting.   Review of Systems  Constitutional: Negative.   HENT: Negative.   Eyes: Negative.   Respiratory: Negative.   Cardiovascular: Positive for leg swelling.  Gastrointestinal: Negative.   Endocrine: Negative.   Genitourinary: Negative.   Musculoskeletal: Negative.   Skin: Negative.   Allergic/Immunologic: Negative.   Neurological: Negative.   Hematological: Negative.   Psychiatric/Behavioral: Negative.       Objective:   Physical Exam  Constitutional: He is oriented to person, place, and time. He appears well-developed and well-nourished. No distress.  HENT:  Head: Normocephalic and atraumatic.  Eyes: Conjunctivae are normal. Pupils are equal, round, and reactive to light.  Neck: Normal range of motion.  Cardiovascular: Normal rate, regular rhythm, normal heart sounds and intact distal pulses.  Pulses:      Radial pulses are 2+ on the right side, and 2+ on the left side.  Hard to palpate pedal pulses due to body habitus and edema however his bilateral feet are warm  Pulmonary/Chest: Effort normal and breath sounds normal.  Musculoskeletal: Normal range of motion. He exhibits edema (Moderate bilateral lower extremity nonpitting  edema noted).  Neurological: He is alert and oriented to person, place, and time.  Skin: He is not diaphoretic.  Right lower extremity: Ulceration shallow noninfected limited to the breakdown of skin noted to the front of the right shin Left lower extremity: 2 ulcerations shallow noninfected limited to the breakdown of skin noted to the lateral aspect of the back of the left calf  Psychiatric: He has a normal mood and affect. His behavior is normal. Judgment and thought content normal.  Vitals reviewed.  BP 140/66 (BP Location: Right Arm)   Pulse 93   Resp 16   Wt 273 lb (123.8 kg)   BMI 37.03 kg/m   Past Medical History:  Diagnosis Date  . Anemia   . Bipolar 1 disorder ([place])   . Carotid artery occlusion   . Dementia   . Diabetes mellitus without complication ([place])   . Gait abnormality 02/12/2017  . Memory disorder 02/12/2017  . TIA (transient ischemic attack)    Social History   Socioeconomic History  . Marital status: Widowed    Spouse name: Not on file  . Number of children: 2  . Years of education: 68  . Highest education level: Not on file  Social Needs  . Financial resource strain: Not on file  . Food insecurity - worry: Not on file  . Food insecurity - inability: Not on file  . Transportation needs - medical: Not on file  . Transportation needs - non-medical: Not on file  Occupational History  . Occupation: Retired  Tobacco Use  . Smoking status: Former Research scientist (life sciences)  . Smokeless tobacco: Never Used  Substance and Sexual Activity  . Alcohol use: Yes    Comment: occasional  . Drug use: No  . Sexual activity: Not on file  Other Topics Concern  . Not on file  Social History Narrative   Lives with [name]   Caffeine use: caffeine free soda daily   Right handed   Past Surgical History:  Procedure Laterality Date  . COLONOSCOPY WITH PROPOFOL N/A 02/25/2017   Procedure: COLONOSCOPY WITH PROPOFOL;  Surgeon: Manya Silvas, MD;  Location: [facility] ENDOSCOPY;   Service: Endoscopy;  Laterality: N/A;  . ESOPHAGOGASTRODUODENOSCOPY (EGD) WITH PROPOFOL N/A 02/25/2017   Procedure: ESOPHAGOGASTRODUODENOSCOPY (EGD) WITH PROPOFOL;  Surgeon: Manya Silvas, MD;  Location: [facility] ENDOSCOPY;  Service: Endoscopy;  Laterality: N/A;  . NASAL SINUS SURGERY    . PACEMAKER PLACEMENT    . TONSILLECTOMY    . TONSILLECTOMY     Family History  Family history unknown: Yes   Allergies  Allergen Reactions  . Clindamycin/Lincomycin     "turns me purple" "turns me orange"  . Lisinopril     REACTION: Cough  . Penicillins       Assessment & Plan:  Patient presents for a monthly wound check.  The patient states that he has taken himself that it Unna boots on Friday.  He told the nurses in triage that he has not been wearing his Unna boots for approximately 2 weeks.  The patient states he continues to elevate his legs and use his lymphedema pump once a day. The patient has purchased a pair [employer] which she has started to wear.  The patient feels that his legs look better.  The patient denies any erythema to the bilateral lower extremity.  Patient denies any pain to the bilateral lower extremity.  The patient denies any fever, nausea vomiting.  1. Lymphedema - Stable The patient took himself out of his bilateral Unna wraps earlier than the normal 4-week course of therapy. He presents today with shallow noninfected ulcerations to the bilateral lower extremities limited to the breakdown of skin. Moderate bilateral edema noted Recommend bilateral inner wraps to the lower extremity for approximately 4 weeks with a 1 month follow-up. The patient was encouraged to continue to elevate his legs heart level or higher and use at least twice a day for an hour each time The patient expresses understanding  2. CKD (chronic kidney disease), stage III (HCC) - Stable This is a contributing factor to the patient's bilateral lower extremity edema  3. Venous ulcer ([place]) - Stable As  above  Current Outpatient Medications on File Prior to Visit  Medication Sig Dispense Refill  . AMBULATORY NON FORMULARY MEDICATION BiPAP machine @ 15/12 DX: OSA DX Code: 1 each 0  . amphetamine-dextroamphetamine (ADDERALL) 20 MG tablet Take 1 tablet by mouth daily.    Marland Kitchen aspirin 325 MG tablet Take 325 mg by mouth daily.    Marland Kitchen buPROPion (WELLBUTRIN XL) 150 MG 24 hr tablet     . clotrimazole-betamethasone (LOTRISONE) cream     . escitalopram (LEXAPRO) 10 MG tablet     . FREESTYLE LITE test strip     . ibuprofen (ADVIL,MOTRIN) 200 MG tablet Take 400 mg by mouth every 6 (six) hours as needed.    . insulin aspart (NOVOLOG FLEXPEN) 100 UNIT/ML FlexPen Take    . insulin regular human CONCENTRATED (HUMULIN R) 500 UNIT/ML injection Inject 25 Units into the  skin.    . LamoTRIgine (LAMICTAL PO) Take 50 mg by mouth 2 (two) times daily.    Marland Kitchen losartan (COZAAR) 50 MG tablet Take 1 tablet by mouth daily.    . metFORMIN (GLUCOPHAGE-XR) 500 MG 24 hr tablet Take 2 tablets by mouth 2 (two) times daily.    . mupirocin ointment (BACTROBAN) 2 %     . omeprazole (PRILOSEC) 40 MG capsule     . pravastatin (PRAVACHOL) 20 MG tablet Take 1 tablet by mouth daily.    . sertraline (ZOLOFT) 50 MG tablet Take 1 tablet by mouth daily.    . tamsulosin (FLOMAX) 0.4 MG CAPS capsule     . TANZEUM 50 MG PEN     . liraglutide (VICTOZA) 18 MG/3ML SOPN Inject into the skin.     No current facility-administered medications on file prior to visit.    There are no Patient Instructions on file for this visit. No Follow-up on file.  Kimyatta Lecy A Stacey Sago, PA-C

## 2017-09-08 DIAGNOSIS — E119 Type 2 diabetes mellitus without complications: Secondary | ICD-10-CM | POA: Insufficient documentation

## 2017-09-08 DIAGNOSIS — K625 Hemorrhage of anus and rectum: Secondary | ICD-10-CM | POA: Insufficient documentation

## 2017-09-08 DIAGNOSIS — R945 Abnormal results of liver function studies: Secondary | ICD-10-CM | POA: Insufficient documentation

## 2017-09-08 DIAGNOSIS — Z9109 Other allergy status, other than to drugs and biological substances: Secondary | ICD-10-CM | POA: Insufficient documentation

## 2017-09-08 DIAGNOSIS — R0789 Other chest pain: Secondary | ICD-10-CM | POA: Insufficient documentation

## 2017-09-08 DIAGNOSIS — N411 Chronic prostatitis: Secondary | ICD-10-CM | POA: Insufficient documentation

## 2017-09-08 DIAGNOSIS — R7989 Other specified abnormal findings of blood chemistry: Secondary | ICD-10-CM | POA: Insufficient documentation

## 2017-09-08 DIAGNOSIS — J329 Chronic sinusitis, unspecified: Secondary | ICD-10-CM | POA: Insufficient documentation

## 2017-09-08 DIAGNOSIS — N529 Male erectile dysfunction, unspecified: Secondary | ICD-10-CM | POA: Insufficient documentation

## 2017-09-08 DIAGNOSIS — N200 Calculus of kidney: Secondary | ICD-10-CM | POA: Insufficient documentation

## 2017-09-09 ENCOUNTER — Encounter: Payer: Self-pay | Admitting: Podiatry

## 2017-09-09 ENCOUNTER — Ambulatory Visit (INDEPENDENT_AMBULATORY_CARE_PROVIDER_SITE_OTHER): Payer: Medicare Other | Admitting: Podiatry

## 2017-09-09 DIAGNOSIS — M722 Plantar fascial fibromatosis: Secondary | ICD-10-CM

## 2017-09-09 NOTE — Progress Notes (Signed)
Subjective:  Patient ID: [name] Day, male    DOB: [birth year],  MRN: [id number] HPI Chief Complaint  Patient presents with  . Foot Orthotics    Patient states he would like to get new orthotics, had orthotics from our lab in [place] 20 years ago    77 y.o. male presents with the above complaint.     Past Medical History:  Diagnosis Date  . Anemia   . Bipolar 1 disorder ([place])   . Carotid artery occlusion   . Dementia   . Diabetes mellitus without complication ([place])   . Gait abnormality 02/12/2017  . Memory disorder 02/12/2017  . TIA (transient ischemic attack)    Past Surgical History:  Procedure Laterality Date  . COLONOSCOPY WITH PROPOFOL N/A 02/25/2017   Procedure: COLONOSCOPY WITH PROPOFOL;  Surgeon: Manya Silvas, MD;  Location[facility]COPY;  Service: Endoscopy;  Laterality: N/A;  . ESOPHAGOGASTRODUODENOSCOPY (EGD) WITH PROPOFOL N/A 02/25/2017   Procedure: ESOPHAGOGASTRODUODENOSCOPY (EGD) WITH PROPOFOL;  Surgeon: Manya Silvas, MD;  Location: [facility] ENDOSCOPY;  Service: Endoscopy;  Laterality: N/A;  . NASAL SINUS SURGERY    . PACEMAKER PLACEMENT    . TONSILLECTOMY    . TONSILLECTOMY      Current Outpatient Medications:  .  insulin regular human CONCENTRATED (HUMULIN R) 500 UNIT/ML injection, Inject into the skin., Disp: , Rfl:  .  AMBULATORY NON FORMULARY MEDICATION, BiPAP machine @ 15/12 DX: OSA DX Code:, Disp: 1 each, Rfl: 0 .  amphetamine-dextroamphetamine (ADDERALL) 20 MG tablet, Take 1 tablet by mouth daily., Disp: , Rfl:  .  aspirin 325 MG tablet, Take 325 mg by mouth daily., Disp: , Rfl:  .  buPROPion (WELLBUTRIN XL) 150 MG 24 hr tablet, , Disp: , Rfl:  .  clotrimazole-betamethasone (LOTRISONE) cream, , Disp: , Rfl:  .  escitalopram (LEXAPRO) 10 MG tablet, , Disp: , Rfl:  .  FREESTYLE LITE test strip, , Disp: , Rfl:  .  ibuprofen (ADVIL,MOTRIN) 200 MG tablet, Take 400 mg by mouth every 6 (six) hours as needed., Disp: , Rfl:  .  insulin aspart  (NOVOLOG FLEXPEN) 100 UNIT/ML FlexPen, Take, Disp: , Rfl:  .  insulin regular human CONCENTRATED (HUMULIN R) 500 UNIT/ML injection, Inject 25 Units into the skin., Disp: , Rfl:  .  LamoTRIgine (LAMICTAL PO), Take 50 mg by mouth 2 (two) times daily., Disp: , Rfl:  .  liraglutide (VICTOZA) 18 MG/3ML SOPN, Inject into the skin., Disp: , Rfl:  .  losartan (COZAAR) 50 MG tablet, Take 1 tablet by mouth daily., Disp: , Rfl:  .  metFORMIN (GLUCOPHAGE-XR) 500 MG 24 hr tablet, Take 2 tablets by mouth 2 (two) times daily., Disp: , Rfl:  .  mupirocin ointment (BACTROBAN) 2 %, , Disp: , Rfl:  .  omeprazole (PRILOSEC) 40 MG capsule, , Disp: , Rfl:  .  pravastatin (PRAVACHOL) 20 MG tablet, Take 1 tablet by mouth daily., Disp: , Rfl:  .  sertraline (ZOLOFT) 50 MG tablet, Take 1 tablet by mouth daily., Disp: , Rfl:  .  tamsulosin (FLOMAX) 0.4 MG CAPS capsule, , Disp: , Rfl:  .  TANZEUM 50 MG PEN, , Disp: , Rfl:   Allergies  Allergen Reactions  . Clindamycin/Lincomycin     "turns me purple" "turns me orange"  . Lisinopril     REACTION: Cough  . Penicillins    Review of Systems  Psychiatric/Behavioral: Positive for agitation.  All other systems reviewed and are negative.  Objective:  There were  no vitals filed for this visit.  General: Well developed, nourished, in no acute distress, alert and oriented x3   Dermatological: Skin is warm, dry and supple bilateral. Nails x 10 are well maintained; remaining integument appears unremarkable at this time. There are no open sores, no preulcerative lesions, no rash or signs of infection present.  Vascular: Dorsalis Pedis artery and Posterior Tibial artery pedal pulses are 2/4 bilateral with immedate capillary fill time. Pedal hair growth present. No varicosities and no lower extremity edema present bilateral.   Neruologic: Grossly intact via light touch bilateral. Vibratory intact via tuning fork bilateral. Protective threshold with Semmes Wienstein  monofilament intact to all pedal sites bilateral. Patellar and Achilles deep tendon reflexes 2+ bilateral. No Babinski or clonus noted bilateral.   Musculoskeletal: No gross boney pedal deformities bilateral. No pain, crepitus, or limitation noted with foot and ankle range of motion bilateral. Muscular strength 5/5 in all groups tested bilateral.  Gait: Unassisted, Nonantalgic.    Radiographs:  Refused  Assessment & Plan:   Assessment: History of plantar fasciitis and neuropathy.  Plan: Orthotics were remain in recovery.     Chigozie Basaldua T. [place], [place]

## 2017-09-14 ENCOUNTER — Ambulatory Visit (INDEPENDENT_AMBULATORY_CARE_PROVIDER_SITE_OTHER): Payer: Medicare Other | Admitting: Vascular Surgery

## 2017-09-14 ENCOUNTER — Encounter (INDEPENDENT_AMBULATORY_CARE_PROVIDER_SITE_OTHER): Payer: Self-pay

## 2017-09-14 VITALS — BP 139/76 | HR 68 | Resp 19 | Ht 70.0 in | Wt 276.0 lb

## 2017-09-14 DIAGNOSIS — I890 Lymphedema, not elsewhere classified: Secondary | ICD-10-CM | POA: Diagnosis not present

## 2017-09-14 NOTE — Progress Notes (Signed)
History of Present Illness  There is no documented history at this time  Assessments & Plan   There are no diagnoses linked to this encounter.    Additional instructions  Subjective:  Patient presents with venous ulcer of the Bilateral lower extremity.    Procedure:  3 layer unna wrap was placed Bilateral lower extremity.   Plan:   Follow up in one week.  

## 2017-09-21 ENCOUNTER — Encounter (INDEPENDENT_AMBULATORY_CARE_PROVIDER_SITE_OTHER): Payer: Self-pay

## 2017-09-21 ENCOUNTER — Ambulatory Visit (INDEPENDENT_AMBULATORY_CARE_PROVIDER_SITE_OTHER): Payer: Medicare Other | Admitting: Vascular Surgery

## 2017-09-21 VITALS — BP 149/69 | HR 86 | Resp 17 | Wt 276.0 lb

## 2017-09-21 DIAGNOSIS — I83893 Varicose veins of bilateral lower extremities with other complications: Secondary | ICD-10-CM

## 2017-09-21 NOTE — Progress Notes (Signed)
History of Present Illness  There is no documented history at this time  Assessments & Plan   There are no diagnoses linked to this encounter.    Additional instructions  Subjective:  Patient presents with venous ulcer of the Bilateral lower extremity.    Procedure:  3 layer unna wrap was placed Bilateral lower extremity.   Plan:   Follow up in one week.  

## 2017-09-29 ENCOUNTER — Ambulatory Visit (INDEPENDENT_AMBULATORY_CARE_PROVIDER_SITE_OTHER): Payer: Medicare Other | Admitting: Vascular Surgery

## 2017-09-29 ENCOUNTER — Encounter (INDEPENDENT_AMBULATORY_CARE_PROVIDER_SITE_OTHER): Payer: Medicare Other | Admitting: Vascular Surgery

## 2017-09-29 DIAGNOSIS — I83893 Varicose veins of bilateral lower extremities with other complications: Secondary | ICD-10-CM | POA: Diagnosis not present

## 2017-09-29 NOTE — Progress Notes (Signed)
Varicose veins of leg with swelling, bilateral     The patient's left lower extremity was sterilely prepped and draped. The ultrasound machine was used to visualize the saphenous vein throughout its course. A segment in the mid to the upper calf was selected for access. The saphenous vein was accessed without difficulty using ultrasound guidance with a micro puncture needle. A micro puncture wire and sheath were then placed. A 0.018 wire was placed beyond the saphenofemoral junction through the sheath and the micro puncture sheath was removed. The 65 cm sheath was then placed over the wire and the wire and dilator were removed. The laser fiber was placed through the sheath and its tip was placed approximately 5 cm below the saphenofemoral junction. Tumescent anesthesia was then created with a dilute lidocaine solution. Laser energy was then delivered with constant withdrawal of the sheath and laser fiber. Approximately 1550 joules of energy were delivered over a length of 36 cm using the 1470 Hz Vena[employer] machine at [employer]. Sterile dressings were placed. The patient tolerated the procedure well without complications.

## 2017-09-30 ENCOUNTER — Ambulatory Visit: Payer: Medicare Other | Admitting: Orthotics

## 2017-09-30 DIAGNOSIS — M722 Plantar fascial fibromatosis: Secondary | ICD-10-CM

## 2017-09-30 NOTE — Progress Notes (Signed)
Patient p/up refurbished f/o. Dropped off second set.

## 2017-10-02 ENCOUNTER — Ambulatory Visit (INDEPENDENT_AMBULATORY_CARE_PROVIDER_SITE_OTHER): Payer: Medicare Other

## 2017-10-02 ENCOUNTER — Ambulatory Visit (INDEPENDENT_AMBULATORY_CARE_PROVIDER_SITE_OTHER): Payer: Medicare Other | Admitting: Urology

## 2017-10-02 ENCOUNTER — Encounter: Payer: Self-pay | Admitting: Urology

## 2017-10-02 VITALS — BP 149/67 | HR 81 | Ht 69.0 in | Wt 285.0 lb

## 2017-10-02 DIAGNOSIS — I83893 Varicose veins of bilateral lower extremities with other complications: Secondary | ICD-10-CM | POA: Diagnosis not present

## 2017-10-02 DIAGNOSIS — N471 Phimosis: Secondary | ICD-10-CM

## 2017-10-02 MED ORDER — CLOTRIMAZOLE-BETAMETHASONE 1-0.05 % EX CREA: TOPICAL_CREAM | Freq: Two times a day (BID) | CUTANEOUS | 3 refills | Status: DC

## 2017-10-02 NOTE — Progress Notes (Signed)
10/02/2017 9:17 AM   [name] Day [birth year] [id number]  Referring provider: Idelle Crouch, MD [facility] [place], [place] [address]  No chief complaint on file.   HPI: The patient is a 77 year old gentleman with multiple medical comorbidities including diabetes and sleep apnea who presents today for evaluation of penile glans irritation and skin adhesions. The patient was circumcised as a child. He notes that last 30-40 years that is foreskin has appeared to grown back in his own words. It becomes irritated and itches.  He was seen previously for this and treated with clotrimazole/betamethasone cream. Of note, the patient had a recent myocardial infarction as well as multiple mini strokes recently. He is a very poor surgical candidate.  He did have secondary phimosis on exam.  The patient was started on this cream and he actually responded quite well.  He presents today for annual exam.  He is currently asymptomatic.  He know where his irritation or itching.  He is happy with this.  He continues to remain a poor surgical candidate is not interested in surgical correction.    PMH: Past Medical History:  Diagnosis Date  . Anemia   . Bipolar 1 disorder ([place])   . Carotid artery occlusion   . Dementia   . Diabetes mellitus without complication ([place])   . Gait abnormality 02/12/2017  . Memory disorder 02/12/2017  . TIA (transient ischemic attack)     Surgical History: Past Surgical History:  Procedure Laterality Date  . COLONOSCOPY WITH PROPOFOL N/A 02/25/2017   Procedure: COLONOSCOPY WITH PROPOFOL;  Surgeon: Manya Silvas, MD;  Location: [facility] ENDOSCOPY;  Service: Endoscopy;  Laterality: N/A;  . ESOPHAGOGASTRODUODENOSCOPY (EGD) WITH PROPOFOL N/A 02/25/2017   Procedure: ESOPHAGOGASTRODUODENOSCOPY (EGD) WITH PROPOFOL;  Surgeon: Manya Silvas, MD;  Location: [facility] ENDOSCOPY;  Service: Endoscopy;  Laterality: N/A;  . NASAL SINUS SURGERY    .  PACEMAKER PLACEMENT    . TONSILLECTOMY    . TONSILLECTOMY      Home Medications:  Allergies as of 10/02/2017      Reactions   Clindamycin/lincomycin    "turns me purple" "turns me orange"   Lisinopril    REACTION: Cough   Penicillins       Medication List        Accurate as of 10/02/17  9:17 AM. Always use your most recent med list.          AMBULATORY NON FORMULARY MEDICATION BiPAP machine @ 15/12 DX: OSA DX Code:   amphetamine-dextroamphetamine 20 MG tablet Commonly known as:  ADDERALL Take 1 tablet by mouth daily.   aspirin 325 MG tablet Take 325 mg by mouth daily.   buPROPion 150 MG 24 hr tablet Commonly known as:  WELLBUTRIN XL   clotrimazole-betamethasone cream Commonly known as:  LOTRISONE Apply topically 2 (two) times daily. Apply to effected area   escitalopram 10 MG tablet Commonly known as:  LEXAPRO   FREESTYLE LITE test strip Generic drug:  glucose blood   HUMULIN R 500 UNIT/ML injection Generic drug:  insulin regular human CONCENTRATED Inject 25 Units into the skin.   HUMULIN R 500 UNIT/ML injection Generic drug:  insulin regular human CONCENTRATED Inject into the skin.   ibuprofen 200 MG tablet Commonly known as:  ADVIL,MOTRIN Take 400 mg by mouth every 6 (six) hours as needed.   LAMICTAL PO Take 50 mg by mouth 2 (two) times daily.   losartan 50 MG tablet Commonly known as:  COZAAR Take 1 tablet by mouth daily.   metFORMIN 500 MG 24 hr tablet Commonly known as:  GLUCOPHAGE-XR Take 2 tablets by mouth 2 (two) times daily.   mupirocin ointment 2 % Commonly known as:  BACTROBAN   NOVOLOG FLEXPEN 100 UNIT/ML FlexPen Generic drug:  insulin aspart Take   omeprazole 40 MG capsule Commonly known as:  PRILOSEC   pravastatin 20 MG tablet Commonly known as:  PRAVACHOL Take 1 tablet by mouth daily.   sertraline 50 MG tablet Commonly known as:  ZOLOFT Take 1 tablet by mouth daily.   tamsulosin 0.4 MG Caps capsule Commonly known as:   FLOMAX   TANZEUM 50 MG Pen Generic drug:  Albiglutide   VICTOZA 18 MG/3ML Sopn Generic drug:  liraglutide Inject into the skin.   XANAX 0.5 MG tablet Generic drug:  ALPRAZolam Take 0.5 mg by mouth at bedtime as needed for anxiety.       Allergies:  Allergies  Allergen Reactions  . Clindamycin/Lincomycin     "turns me purple" "turns me orange"  . Lisinopril     REACTION: Cough  . Penicillins     Family History: Family History  Family history unknown: Yes    Social History:  reports that he has quit smoking. he has never used smokeless tobacco. He reports that he drinks alcohol. He reports that he does not use drugs.  ROS: UROLOGY Frequent Urination?: No Hard to postpone urination?: No Burning/pain with urination?: No Get up at night to urinate?: No Leakage of urine?: No Urine stream starts and stops?: No Trouble starting stream?: No Do you have to strain to urinate?: No Blood in urine?: No Urinary tract infection?: No Sexually transmitted disease?: No Injury to kidneys or bladder?: No Painful intercourse?: No Weak stream?: No Erection problems?: Yes Penile pain?: No  Gastrointestinal Nausea?: No Vomiting?: No Indigestion/heartburn?: No Diarrhea?: No Constipation?: No  Constitutional Fever: No Night sweats?: No Weight loss?: No Fatigue?: No  Skin Skin rash/lesions?: No Itching?: No  Eyes Blurred vision?: No Double vision?: No  Ears/Nose/Throat Sore throat?: No Sinus problems?: No  Hematologic/Lymphatic Swollen glands?: No Easy bruising?: No  Cardiovascular Leg swelling?: No Chest pain?: No  Respiratory Cough?: No Shortness of breath?: No  Endocrine Excessive thirst?: No  Musculoskeletal Back pain?: No Joint pain?: No  Neurological Headaches?: No Dizziness?: No  Psychologic Depression?: No Anxiety?: No  Physical Exam: BP (!) 149/67   Pulse 81   Ht 5\' 9"  (1.753 m)   Wt 285 lb (129.3 kg)   BMI 42.09 kg/m     Constitutional:  Alert and oriented, No acute distress. HEENT: [place] AT, moist mucus membranes.  Trachea midline, no masses. Cardiovascular: No clubbing, cyanosis, or edema. Respiratory: Normal respiratory effort, no increased work of breathing. GI: Abdomen is soft, nontender, nondistended, no abdominal masses GU: No CVA tenderness.  Secondary phimosis with buried penis.  Testicles descended equal bilaterally.  No irritation within the inner surface of the secondary phimosis.  No lesions. Skin: No rashes, bruises or suspicious lesions. Lymph: No cervical or inguinal adenopathy. Neurologic: Grossly intact, no focal deficits, moving all 4 extremities. Psychiatric: Normal mood and affect.  Laboratory Data: Lab Results  Component Value Date   WBC 7.2 04/24/2016   HGB 13.4 04/24/2016   HCT 42.2 04/24/2016   MCV 69.5 (L) 04/24/2016   PLT 103 (L) 04/24/2016    Lab Results  Component Value Date   CREATININE 1.11 04/24/2016    No results found for: PSA  No results found for: TESTOSTERONE  Lab Results  Component Value Date   HGBA1C 6.0 04/24/2016    Urinalysis    Component Value Date/Time   COLORURINE YELLOW (A) 04/24/2016 1435   APPEARANCEUR Clear 11/05/2016 1514   LABSPEC 1.011 04/24/2016 1435   PHURINE 6.0 04/24/2016 1435   GLUCOSEU 3+ (A) 11/05/2016 1514   HGBUR NEGATIVE 04/24/2016 1435   BILIRUBINUR Negative 11/05/2016 1514   KETONESUR NEGATIVE 04/24/2016 1435   PROTEINUR Negative 11/05/2016 1514   PROTEINUR 100 (A) 04/24/2016 1435   NITRITE Negative 11/05/2016 1514   NITRITE NEGATIVE 04/24/2016 1435   LEUKOCYTESUR 1+ (A) 11/05/2016 1514    Assessment & Plan:    1.  Secondary phimosis with buried penis The patient is a poor surgical candidate.  If he were surgical candidate, he only likely be a candidate for a dorsal slit due to lack of extra penile skin.  However, he is well controlled on betamethasone/clotrimazole cream.  He will continue this.  He will follow-up  annually.  Return in about 1 year (around 10/02/2018).  Nickie Retort, MD  [facility] [address], [place], [place] [address] [phone]

## 2017-10-07 ENCOUNTER — Encounter (INDEPENDENT_AMBULATORY_CARE_PROVIDER_SITE_OTHER): Payer: Self-pay | Admitting: Vascular Surgery

## 2017-10-08 ENCOUNTER — Telehealth (INDEPENDENT_AMBULATORY_CARE_PROVIDER_SITE_OTHER): Payer: Self-pay | Admitting: Vascular Surgery

## 2017-10-08 NOTE — Telephone Encounter (Signed)
Patient called to express that he has been wearing his stocking since his laser and he is now having swelling and bubbling. Please advise. Pt thinks he needs an unna wrap

## 2017-10-08 NOTE — Telephone Encounter (Signed)
Patient will be coming in tomorrow for a unna wrap

## 2017-10-09 ENCOUNTER — Ambulatory Visit (INDEPENDENT_AMBULATORY_CARE_PROVIDER_SITE_OTHER): Payer: Medicare Other | Admitting: Vascular Surgery

## 2017-10-09 ENCOUNTER — Encounter (INDEPENDENT_AMBULATORY_CARE_PROVIDER_SITE_OTHER): Payer: Self-pay | Admitting: Vascular Surgery

## 2017-10-09 VITALS — BP 127/55 | HR 75 | Resp 18 | Ht 70.0 in | Wt 288.0 lb

## 2017-10-09 DIAGNOSIS — M7989 Other specified soft tissue disorders: Secondary | ICD-10-CM

## 2017-10-09 NOTE — Progress Notes (Signed)
History of Present Illness  There is no documented history at this time  Assessments & Plan   There are no diagnoses linked to this encounter.    Additional instructions  Subjective:  Patient presents with venous ulcer of the Bilateral lower extremity.    Procedure:  3 layer unna wrap was placed Bilateral lower extremity.   Plan:   Follow up in one week.  

## 2017-10-16 ENCOUNTER — Ambulatory Visit (INDEPENDENT_AMBULATORY_CARE_PROVIDER_SITE_OTHER): Payer: Medicare Other | Admitting: Vascular Surgery

## 2017-10-16 ENCOUNTER — Encounter (INDEPENDENT_AMBULATORY_CARE_PROVIDER_SITE_OTHER): Payer: Self-pay

## 2017-10-16 VITALS — BP 146/69 | HR 86 | Resp 17 | Ht 71.0 in | Wt 296.0 lb

## 2017-10-16 DIAGNOSIS — I83893 Varicose veins of bilateral lower extremities with other complications: Secondary | ICD-10-CM | POA: Diagnosis not present

## 2017-10-16 NOTE — Progress Notes (Signed)
History of Present Illness  There is no documented history at this time  Assessments & Plan   There are no diagnoses linked to this encounter.    Additional instructions  Subjective:  Patient presents with venous ulcer of the Bilateral lower extremity.    Procedure:  3 layer unna wrap was placed Bilateral lower extremity.   Plan:   Follow up in one week.  

## 2017-10-23 ENCOUNTER — Ambulatory Visit (INDEPENDENT_AMBULATORY_CARE_PROVIDER_SITE_OTHER): Payer: Medicare Other | Admitting: Vascular Surgery

## 2017-10-23 ENCOUNTER — Encounter (INDEPENDENT_AMBULATORY_CARE_PROVIDER_SITE_OTHER): Payer: Self-pay

## 2017-10-23 VITALS — BP 140/59 | HR 86 | Resp 18 | Wt 296.0 lb

## 2017-10-23 DIAGNOSIS — I83893 Varicose veins of bilateral lower extremities with other complications: Secondary | ICD-10-CM

## 2017-10-23 NOTE — Progress Notes (Signed)
History of Present Illness  There is no documented history at this time  Assessments & Plan   There are no diagnoses linked to this encounter.    Additional instructions  Subjective:  Patient presents with venous ulcer of the Bilateral lower extremity.    Procedure:  3 layer unna wrap was placed Bilateral lower extremity.   Plan:   Follow up in one week.  

## 2017-10-30 ENCOUNTER — Ambulatory Visit (INDEPENDENT_AMBULATORY_CARE_PROVIDER_SITE_OTHER): Payer: Medicare Other | Admitting: Vascular Surgery

## 2017-10-30 ENCOUNTER — Encounter (INDEPENDENT_AMBULATORY_CARE_PROVIDER_SITE_OTHER): Payer: Self-pay | Admitting: Vascular Surgery

## 2017-10-30 VITALS — BP 150/67 | HR 79 | Resp 17 | Wt 296.0 lb

## 2017-10-30 DIAGNOSIS — M7989 Other specified soft tissue disorders: Secondary | ICD-10-CM

## 2017-10-30 DIAGNOSIS — I872 Venous insufficiency (chronic) (peripheral): Secondary | ICD-10-CM

## 2017-10-30 DIAGNOSIS — E1122 Type 2 diabetes mellitus with diabetic chronic kidney disease: Secondary | ICD-10-CM

## 2017-10-30 DIAGNOSIS — I10 Essential (primary) hypertension: Secondary | ICD-10-CM

## 2017-10-30 NOTE — Assessment & Plan Note (Signed)
Much better.  Continue compression stockings and elevation.  Recheck in 3 months.

## 2017-10-30 NOTE — Assessment & Plan Note (Signed)
blood glucose control important in reducing the progression of atherosclerotic disease. Also, involved in wound healing. On appropriate medications.  

## 2017-10-30 NOTE — Assessment & Plan Note (Signed)
Improved with intervention.  Continue compression stockings and elevation.

## 2017-10-30 NOTE — Progress Notes (Signed)
MRN : [id number]  [name] Day is a 77 y.o. (Mar 23, 1941) male who presents with chief complaint of  Chief Complaint  Patient presents with  . Follow-up    post laser and unna check  .  History of Present Illness: Patient returns today in follow up of venous disease.  His legs are actually doing much better.  He has been getting Unna boots for several weeks now.  He reports the left leg to be much better after laser ablation.  It is still more swollen than the right, but neither are that severe today no more weeping or drainage.  Current Outpatient Medications  Medication Sig Dispense Refill  . ALPRAZolam (XANAX) 0.5 MG tablet Take 0.5 mg by mouth at bedtime as needed for anxiety.    . AMBULATORY NON FORMULARY MEDICATION BiPAP machine @ 15/12 DX: OSA DX Code: 1 each 0  . amphetamine-dextroamphetamine (ADDERALL) 20 MG tablet Take 1 tablet by mouth daily.    Marland Kitchen aspirin 325 MG tablet Take 325 mg by mouth daily.    Marland Kitchen buPROPion (WELLBUTRIN XL) 150 MG 24 hr tablet     . clotrimazole-betamethasone (LOTRISONE) cream Apply topically 2 (two) times daily. Apply to effected area 30 g 3  . escitalopram (LEXAPRO) 10 MG tablet     . FREESTYLE LITE test strip     . ibuprofen (ADVIL,MOTRIN) 200 MG tablet Take 400 mg by mouth every 6 (six) hours as needed.    . insulin aspart (NOVOLOG FLEXPEN) 100 UNIT/ML FlexPen Take    . insulin regular human CONCENTRATED (HUMULIN R) 500 UNIT/ML injection Inject 25 Units into the skin.    . LamoTRIgine (LAMICTAL PO) Take 50 mg by mouth 2 (two) times daily.    Marland Kitchen losartan (COZAAR) 50 MG tablet Take 1 tablet by mouth daily.    . metFORMIN (GLUCOPHAGE-XR) 500 MG 24 hr tablet Take 2 tablets by mouth 2 (two) times daily.    . mupirocin ointment (BACTROBAN) 2 %     . omeprazole (PRILOSEC) 40 MG capsule     . pravastatin (PRAVACHOL) 20 MG tablet Take 1 tablet by mouth daily.    . sertraline (ZOLOFT) 50 MG tablet Take 1 tablet by mouth daily.    . tamsulosin (FLOMAX)  0.4 MG CAPS capsule     . TANZEUM 50 MG PEN     . liraglutide (VICTOZA) 18 MG/3ML SOPN Inject into the skin.     No current facility-administered medications for this visit.     Past Medical History:  Diagnosis Date  . Anemia   . Bipolar 1 disorder ([place])   . Carotid artery occlusion   . Dementia   . Diabetes mellitus without complication ([place])   . Gait abnormality 02/12/2017  . Memory disorder 02/12/2017  . TIA (transient ischemic attack)     Past Surgical History:  Procedure Laterality Date  . COLONOSCOPY WITH PROPOFOL N/A 02/25/2017   Procedure: COLONOSCOPY WITH PROPOFOL;  Surgeon: Manya Silvas, MD;  Location[facility] ENDOSCOPY;  Service: Endoscopy;  Laterality: N/A;  . ESOPHAGOGASTRODUODENOSCOPY (EGD) WITH PROPOFOL N/A 02/25/2017   Procedure: ESOPHAGOGASTRODUODENOSCOPY (EGD) WITH PROPOFOL;  Surgeon: Manya Silvas, MD;  Location: [facility] ENDOSCOPY;  Service: Endoscopy;  Laterality: N/A;  . NASAL SINUS SURGERY    . PACEMAKER PLACEMENT    . TONSILLECTOMY    . TONSILLECTOMY      Social History Social History   Tobacco Use  . Smoking status: Former Research scientist (life sciences)  . Smokeless tobacco: Never Used  Substance Use Topics  . Alcohol use: Yes    Comment: occasional  . Drug use: No     Family History Family History  Family history unknown: Yes     Allergies  Allergen Reactions  . Clindamycin/Lincomycin     "turns me purple" "turns me orange"  . Lisinopril     REACTION: Cough  . Penicillins      REVIEW OF SYSTEMS (Negative unless checked)  Constitutional: [] Weight loss  [] Fever  [] Chills Cardiac: [] Chest pain   [] Chest pressure   [] Palpitations   [] Shortness of breath when laying flat   [] Shortness of breath at rest   [] Shortness of breath with exertion. Vascular:  [] Pain in legs with walking   [] Pain in legs at rest   [] Pain in legs when laying flat   [] Claudication   [] Pain in feet when walking  [] Pain in feet at rest  [] Pain in feet when laying flat   [] History of DVT    [] Phlebitis   [x] Swelling in legs   [x] Varicose veins   [] Non-healing ulcers Pulmonary:   [] Uses home oxygen   [] Productive cough   [] Hemoptysis   [] Wheeze  [] COPD   [] Asthma Neurologic:  [] Dizziness  [] Blackouts   [] Seizures   [] History of stroke   [] History of TIA  [] Aphasia   [] Temporary blindness   [] Dysphagia   [] Weakness or numbness in arms   [] Weakness or numbness in legs Musculoskeletal:  [x] Arthritis   [] Joint swelling   [] Joint pain   [] Low back pain Hematologic:  [] Easy bruising  [] Easy bleeding   [] Hypercoagulable state   [] Anemic   Gastrointestinal:  [] Blood in stool   [] Vomiting blood  [] Gastroesophageal reflux/heartburn   [] Abdominal pain Genitourinary:  [] Chronic kidney disease   [] Difficult urination  [] Frequent urination  [] Burning with urination   [] Hematuria Skin:  [] Rashes   [x] Ulcers   [x] Wounds Psychological:  [] History of anxiety   []  History of major depression.  Physical Examination  BP (!) 150/67 (BP Location: Right Arm)   Pulse 79   Resp 17   Wt 134.3 kg (296 lb)   BMI 41.28 kg/m  Gen:  WD/WN, NAD Head: [place]/AT, No temporalis wasting. Ear/Nose/Throat: Hearing grossly intact, nares w/o erythema or drainage, trachea midline Eyes: Conjunctiva clear. Sclera non-icteric Neck: Supple.  No JVD.  Pulmonary:  Good air movement, no use of accessory muscles.  Cardiac: Regular Vascular:  Vessel Right Left  Radial Palpable Palpable                                    Musculoskeletal: M/S 5/5 throughout.  No deformity or atrophy.  1+ right lower extremity edema, 1-2+ left lower extremity edema. Neurologic: Sensation grossly intact in extremities.  Symmetrical.  Speech is fluent.  Psychiatric: Judgment intact, Mood & affect appropriate for pt's clinical situation. Dermatologic: The venous ulcers have healed      Labs No results found for this or any previous visit (from the past 2160 hour(s)).  Radiology No results found.    Assessment/Plan  Benign  essential hypertension blood pressure control important in reducing the progression of atherosclerotic disease. On appropriate oral medications.   Chronic venous insufficiency Improved with intervention.  Continue compression stockings and elevation.  Diabetes ([place]) blood glucose control important in reducing the progression of atherosclerotic disease. Also, involved in wound healing. On appropriate medications.   Swelling of limb Much better.  Continue compression stockings and elevation.  Recheck in 3 months.    Leotis Pain, MD  10/30/2017 11:24 AM    This note was created with Dragon medical transcription system.  Any errors from dictation are purely unintentional

## 2017-10-30 NOTE — Assessment & Plan Note (Signed)
blood pressure control important in reducing the progression of atherosclerotic disease. On appropriate oral medications.  

## 2018-02-04 ENCOUNTER — Other Ambulatory Visit: Payer: Self-pay

## 2018-02-04 ENCOUNTER — Emergency Department
Admission: EM | Admit: 2018-02-04 | Discharge: 2018-02-05 | Disposition: A | Discharge status: Home / Self Care | Payer: Medicare Other | Attending: Emergency Medicine | Admitting: Emergency Medicine

## 2018-02-04 ENCOUNTER — Emergency Department: Payer: Medicare Other

## 2018-02-04 ENCOUNTER — Encounter: Payer: Self-pay | Admitting: Emergency Medicine

## 2018-02-04 DIAGNOSIS — Z8673 Personal history of transient ischemic attack (TIA), and cerebral infarction without residual deficits: Secondary | ICD-10-CM | POA: Diagnosis not present

## 2018-02-04 DIAGNOSIS — G510 Bell's palsy: Secondary | ICD-10-CM | POA: Insufficient documentation

## 2018-02-04 DIAGNOSIS — F419 Anxiety disorder, unspecified: Secondary | ICD-10-CM | POA: Insufficient documentation

## 2018-02-04 DIAGNOSIS — Z79899 Other long term (current) drug therapy: Secondary | ICD-10-CM | POA: Insufficient documentation

## 2018-02-04 DIAGNOSIS — Z950 Presence of cardiac pacemaker: Secondary | ICD-10-CM | POA: Diagnosis not present

## 2018-02-04 DIAGNOSIS — Z7982 Long term (current) use of aspirin: Secondary | ICD-10-CM | POA: Diagnosis not present

## 2018-02-04 DIAGNOSIS — Z794 Long term (current) use of insulin: Secondary | ICD-10-CM | POA: Diagnosis not present

## 2018-02-04 DIAGNOSIS — Z87891 Personal history of nicotine dependence: Secondary | ICD-10-CM | POA: Insufficient documentation

## 2018-02-04 DIAGNOSIS — F0390 Unspecified dementia without behavioral disturbance: Secondary | ICD-10-CM | POA: Insufficient documentation

## 2018-02-04 DIAGNOSIS — N183 Chronic kidney disease, stage 3 (moderate): Secondary | ICD-10-CM | POA: Diagnosis not present

## 2018-02-04 DIAGNOSIS — F329 Major depressive disorder, single episode, unspecified: Secondary | ICD-10-CM | POA: Insufficient documentation

## 2018-02-04 DIAGNOSIS — R29810 Facial weakness: Secondary | ICD-10-CM | POA: Diagnosis present

## 2018-02-04 DIAGNOSIS — Z791 Long term (current) use of non-steroidal anti-inflammatories (NSAID): Secondary | ICD-10-CM | POA: Insufficient documentation

## 2018-02-04 DIAGNOSIS — E1122 Type 2 diabetes mellitus with diabetic chronic kidney disease: Secondary | ICD-10-CM | POA: Diagnosis not present

## 2018-02-04 DIAGNOSIS — I129 Hypertensive chronic kidney disease with stage 1 through stage 4 chronic kidney disease, or unspecified chronic kidney disease: Secondary | ICD-10-CM | POA: Insufficient documentation

## 2018-02-04 DIAGNOSIS — F319 Bipolar disorder, unspecified: Secondary | ICD-10-CM | POA: Insufficient documentation

## 2018-02-04 LAB — CBC
HCT: 38.4 % — ABNORMAL LOW (ref 40.0–52.0)
Hemoglobin: 12.7 g/dL — ABNORMAL LOW (ref 13.0–18.0)
MCH: 21.7 pg — ABNORMAL LOW (ref 26.0–34.0)
MCHC: 33.1 g/dL (ref 32.0–36.0)
MCV: 65.7 fL — AB (ref 80.0–100.0)
PLATELETS: 106 10*3/uL — AB (ref 150–440)
RBC: 5.85 MIL/uL (ref 4.40–5.90)
RDW: 16.3 % — ABNORMAL HIGH (ref 11.5–14.5)
WBC: 5.4 10*3/uL (ref 3.8–10.6)

## 2018-02-04 LAB — COMPREHENSIVE METABOLIC PANEL
ALT: 20 U/L (ref 17–63)
AST: 35 U/L (ref 15–41)
Albumin: 3.8 g/dL (ref 3.5–5.0)
Alkaline Phosphatase: 95 U/L (ref 38–126)
Anion gap: 10 (ref 5–15)
BUN: 13 mg/dL (ref 6–20)
CHLORIDE: 101 mmol/L (ref 101–111)
CO2: 25 mmol/L (ref 22–32)
CREATININE: 0.72 mg/dL (ref 0.61–1.24)
Calcium: 9.2 mg/dL (ref 8.9–10.3)
GFR calc Af Amer: >60 mL/min (ref >60)
Glucose, Bld: 244 mg/dL — ABNORMAL HIGH (ref 65–99)
Potassium: 3.6 mmol/L (ref 3.5–5.1)
SODIUM: 136 mmol/L (ref 135–145)
Total Bilirubin: 1.9 mg/dL — ABNORMAL HIGH (ref 0.3–1.2)
Total Protein: 7 g/dL (ref 6.5–8.1)

## 2018-02-04 LAB — TROPONIN I: Troponin I: 0.03 ng/mL (ref <0.03)

## 2018-02-04 MED ORDER — PREDNISONE 20 MG PO TABS
60.0000 mg | ORAL_TABLET | Freq: Once | ORAL | Status: AC
  Administered 2018-02-04: 60 mg via ORAL
  Filled 2018-02-04: qty 6

## 2018-02-04 NOTE — ED Notes (Signed)
Patient transported to CT 

## 2018-02-04 NOTE — ED Triage Notes (Addendum)
Pt to triage via w/c with no distress noted; pt reports awoke this morning with inability to close left eye and left facial droop; denies any other c/o; pt A&Ox3, MAEW, speech clear

## 2018-02-04 NOTE — ED Provider Notes (Signed)
[facility] Emergency Department Provider Note    First MD Initiated Contact with Patient 02/04/18 2329     (approximate)  I have reviewed the triage vital signs and the nursing notes.   HISTORY  Chief Complaint Facial Droop   HPI NEKO BOYAJIAN is a 77 y.o. male with below list of chronic medical conditions including 2 previous CVAs presents patient.  Patient denies any headache.  Patient denies any visual changes.  Patient denies any leg weakness numbness or gait instability.   Past Medical History:  Diagnosis Date  . Anemia   . Bipolar 1 disorder ([place])   . Carotid artery occlusion   . Dementia   . Diabetes mellitus without complication ([place])   . Gait abnormality 02/12/2017  . Memory disorder 02/12/2017  . TIA (transient ischemic attack)     Patient Active Problem List   Diagnosis Date Noted  . Abnormal LFTs 09/08/2017  . 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 Diabetes mellitus type 2, uncomplicated ([place]) 09/03/7251  . ED (erectile dysfunction) 09/08/2017  . Environmental allergies 09/08/2017  . Nephrolithiasis 09/08/2017  . Non-cardiac chest pain 09/08/2017  . Rectal bleeding 09/08/2017  . Sinusitis 09/08/2017  . Varicose veins of leg with swelling, bilateral 06/12/2017  . Venous ulcer ([place]) 05/18/2017  . Chronic venous insufficiency 05/18/2017  . Memory disorder 02/12/2017  . Gait abnormality 02/12/2017  . CKD (chronic kidney disease), stage III ([place]) 02/06/2017  . Hyperlipidemia 02/06/2017  . Swelling of limb 02/06/2017  . Lymphedema 02/06/2017  . Heme positive stool 12/04/2016  . Mixed dementia 07/29/2016  . Cardiac pacemaker in situ 04/27/2016  . AV heart block 04/24/2016  . Hyperglycemia 04/24/2016  . Generalized weakness 04/24/2016  . Syncope and collapse 04/24/2016  . Metal bone fixation hardware in place 04/21/2016  . Bilateral carotid artery stenosis 02/05/2015  . OSA (obstructive sleep apnea) 02/05/2015  . Benign  essential hypertension 01/19/2015  . B12 deficiency 05/23/2014  . 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 Long-term insulin use ([place]) 05/22/2014  . Type II diabetes mellitus with manifestations, uncontrolled ([place]) 05/22/2014  . Aortic stenosis, moderate 11/14/2013  . Diabetes ([place]) 06/22/2007  . ANEMIA-NOS 03/29/2007  . ANXIETY 03/29/2007  . DEPRESSION 03/29/2007  . ALLERGIC RHINITIS 03/29/2007  . ASTHMA 03/29/2007  . OSTEOARTHRITIS 03/29/2007  . NEPHROLITHIASIS, HX OF 03/29/2007    Past Surgical History:  Procedure Laterality Date  . COLONOSCOPY WITH PROPOFOL N/A 02/25/2017   Procedure: COLONOSCOPY WITH PROPOFOL;  Surgeon: Manya Silvas, MD;  [facility] END[facility]COPY;  Service: Endoscopy;  Laterality: N/A;  . ESOPHAGOGASTRODUODENOSCOPY (EGD) WITH PROPOFOL N/A 02/25/2017   Procedure: ESOPHAGOGASTRODUODENOSCOPY (EGD) WITH PROPOFOL;  Surgeon: Manya Silvas, MD;  Location: [facility] ENDOSCOPY;  Service: Endoscopy;  Laterality: N/A;  . NASAL SINUS SURGERY    . PACEMAKER PLACEMENT    . TONSILLECTOMY    . TONSILLECTOMY      Prior to Admission medications   Medication Sig Start Date End Date Taking? Authorizing Provider  ALPRAZolam Duanne Moron) 0.5 MG tablet Take 0.5 mg by mouth at bedtime as needed for anxiety.    [provider]  AMBULATORY NON FORMULARY MEDICATION BiPAP machine @ 15/12 DX: OSA DX Code: 02/09/17   Wilhelmina Mcardle, MD  amphetamine-dextroamphetamine (ADDERALL) 20 MG tablet Take 1 tablet by mouth daily. 04/21/16   [provider]  aspirin 325 MG tablet Take 325 mg by mouth daily.    [provider]  buPROPion (WELLBUTRIN XL) 150 MG 24  hr tablet  10/15/16   [provider]  clotrimazole-betamethasone (LOTRISONE) cream Apply topically 2 (two) times daily. Apply to effected area 10/02/17   Nickie Retort, MD  escitalopram Loma Sousa) 10 MG tablet  10/15/16   [provider]  FREESTYLE LITE test strip  11/03/16    [provider]  ibuprofen (ADVIL,MOTRIN) 200 MG tablet Take 400 mg by mouth every 6 (six) hours as needed.    [provider]  insulin aspart (NOVOLOG FLEXPEN) 100 UNIT/ML FlexPen Take 06/19/16   [provider]  insulin regular human CONCENTRATED (HUMULIN R) 500 UNIT/ML injection Inject 25 Units into the skin. 11/30/15   [provider]  LamoTRIgine (LAMICTAL PO) Take 50 mg by mouth 2 (two) times daily.    [provider]  liraglutide (VICTOZA) 18 MG/3ML SOPN Inject into the skin. 01/22/17 02/21/17  [provider]  losartan (COZAAR) 50 MG tablet Take 1 tablet by mouth daily. 04/17/16   [provider]  metFORMIN (GLUCOPHAGE-XR) 500 MG 24 hr tablet Take 2 tablets by mouth 2 (two) times daily. 04/17/16   [provider]  mupirocin ointment (BACTROBAN) 2 %  03/31/17   [provider]  omeprazole (PRILOSEC) 40 MG capsule  02/27/17   [provider]  pravastatin (PRAVACHOL) 20 MG tablet Take 1 tablet by mouth daily. 04/07/16   [provider]  sertraline (ZOLOFT) 50 MG tablet Take 1 tablet by mouth daily. 04/17/16   [provider]  tamsulosin (FLOMAX) 0.4 MG CAPS capsule  11/01/16   [provider]  TANZEUM 50 MG PEN  09/07/16   [provider]    Allergies Clindamycin/lincomycin; Lisinopril; and Penicillins  Family History  Family history unknown: Yes    Social History Social History   Tobacco Use  . Smoking status: Former Research scientist (life sciences)  . Smokeless tobacco: Never Used  Substance Use Topics  . Alcohol use: Yes    Comment: occasional  . Drug use: No    Review of Systems Constitutional: No fever/chills Eyes: No visual changes. ENT: No sore throat. Cardiovascular: Denies chest pain. Respiratory: Denies shortness of breath. Gastrointestinal: No abdominal pain.  No nausea, no vomiting.  No diarrhea.  No constipation. Genitourinary: Negative for dysuria. Musculoskeletal:  Negative for neck pain.  Negative for back pain. Integumentary: Negative for rash. Neurological: Positive for left facial droop negative for headaches ____________________________________________   PHYSICAL EXAM:  VITAL SIGNS: ED Triage Vitals [02/04/18 2226]  Enc Vitals Group     BP (!) 118/56     Pulse Rate 77     Resp 18     Temp 98.1 F (36.7 C)     Temp Source Oral     SpO2 97 %     Weight 121.1 kg (267 lb)     Height 1.753 m (5\' 9" )     Head Circumference      Peak Flow      Pain Score 0     Pain Loc      Pain Edu?      Excl. in [place]?     Constitutional: Alert and oriented. Well appearing and in no acute distress. Eyes: Conjunctivae are normal. PERRL. EOMI. Head: Atraumatic.Marland Kitchen Mouth/Throat: Mucous membranes are moist.  Oropharynx non-erythematous. Neck: No stridor.  No meningeal signs.   Cardiovascular: Normal rate, regular rhythm. Good peripheral circulation. Grossly normal heart sounds. Respiratory: Normal respiratory effort.  No retractions. Lungs CTAB. Gastrointestinal: Soft and nontender. No distention.  Musculoskeletal: No lower extremity tenderness  nor edema. No gross deformities of extremities. Neurologic:  Normal speech and language.  Left facial weakness with forehead involvement Skin:  Skin is warm, dry and intact. No rash noted. Psychiatric: Mood and affect are normal. Speech and behavior are normal.  ____________________________________________   LABS (all labs ordered are listed, but only abnormal results are displayed)  Labs Reviewed  CBC - Abnormal; Notable for the following components:      Result Value   Hemoglobin 12.7 (*)    HCT 38.4 (*)    MCV 65.7 (*)    MCH 21.7 (*)    RDW 16.3 (*)    Platelets 106 (*)    All other components within normal limits  COMPREHENSIVE METABOLIC PANEL - Abnormal; Notable for the following components:   Glucose, Bld 244 (*)    Total Bilirubin 1.9 (*)    All other components within normal limits  TROPONIN I  - Abnormal; Notable for the following components:   Troponin I 0.03 (*)    All other components within normal limits  TSH   ____________________________________________  EKG ED ECG REPORT I, Astatula N BROWN, the attending physician, personally viewed and interpreted this ECG.   Date: 02/05/2018  EKG Time: 10:33PM  Rate: 109  Rhythm: Sinus tachycardia  Axis: Normal  Intervals:Normal  ST&T Change: None  ____________________________________________  RADIOLOGY I, [place] Ernst Bowler, personally viewed and evaluated these images (plain radiographs) as part of my medical decision making, as well as reviewing the written report by the radiologist.  ED MD interpretation:  No acute intracranial abnormalities per radiologist  Official radiology report(s): Ct Head Wo Contrast  Result Date: 02/04/2018 CLINICAL DATA:  Left facial droop. EXAM: CT HEAD WITHOUT CONTRAST TECHNIQUE: Contiguous axial images were obtained from the base of the skull through the vertex without intravenous contrast. COMPARISON:  CT head dated May 14, 2016. FINDINGS: Brain: No evidence of acute infarction, hemorrhage, hydrocephalus, extra-axial collection or mass lesion/mass effect. Chronic lacunar infarcts in the bilateral basal ganglia again noted. Stable atrophy and chronic microvascular ischemic changes. Vascular: Atherosclerotic vascular calcification of the carotid siphons. No hyperdense vessel. Skull: Negative for fracture or focal lesion. Sinuses/Orbits: No acute finding.  Chronic right ethmoid sinusitis. Other: None. IMPRESSION: 1.  No acute intracranial abnormality. Electronically Signed   By: Titus Dubin M.D.   On: 02/04/2018 23:11      Procedures   ____________________________________________   INITIAL IMPRESSION / ASSESSMENT AND PLAN / ED COURSE  As part of my medical decision making, I reviewed the following data within the electronic MEDICAL RECORD NUMBER  77 year old male presented with  above-stated history and physical exam concerning for Bell's palsy versus CVA.  Clinical exam consistent with Bell's given forehead involvement however CT scan was performed for my evaluation.  Patient treated based on current guidelines with prednisone and valacyclovir spoke with patient at length regarding follow-up Bell's palsy. ____________________________________________  FINAL CLINICAL IMPRESSION(S) / ED DIAGNOSES  Final diagnoses:  Bell's palsy     MEDICATIONS GIVEN DURING THIS VISIT:  Medications  predniSONE (DELTASONE) tablet 60 mg (has no administration in time range)     ED Discharge Orders    None       Note:  This document was prepared using Dragon voice recognition software and may include unintentional dictation errors.    Gregor Hams, MD 02/05/18 [phone]

## 2018-02-04 NOTE — ED Notes (Signed)
Pt ambulatory to in rm BR with stand by assist

## 2018-02-04 NOTE — ED Notes (Signed)
ED Provider at bedside. 

## 2018-02-05 ENCOUNTER — Encounter (INDEPENDENT_AMBULATORY_CARE_PROVIDER_SITE_OTHER): Payer: Self-pay | Admitting: Vascular Surgery

## 2018-02-05 ENCOUNTER — Ambulatory Visit (INDEPENDENT_AMBULATORY_CARE_PROVIDER_SITE_OTHER): Payer: Medicare Other | Admitting: Vascular Surgery

## 2018-02-05 VITALS — BP 142/73 | HR 88 | Resp 18 | Ht 69.0 in | Wt 267.0 lb

## 2018-02-05 DIAGNOSIS — E1122 Type 2 diabetes mellitus with diabetic chronic kidney disease: Secondary | ICD-10-CM | POA: Diagnosis not present

## 2018-02-05 DIAGNOSIS — I890 Lymphedema, not elsewhere classified: Secondary | ICD-10-CM | POA: Diagnosis not present

## 2018-02-05 DIAGNOSIS — I872 Venous insufficiency (chronic) (peripheral): Secondary | ICD-10-CM

## 2018-02-05 LAB — TSH: TSH: 3.982 u[IU]/mL (ref 0.350–4.500)

## 2018-02-05 MED ORDER — PREDNISONE 20 MG PO TABS: 60.0000 mg | ORAL_TABLET | Freq: Every day | ORAL | 0 refills | Status: AC

## 2018-02-05 MED ORDER — VALACYCLOVIR HCL 1 G PO TABS: 1000.0000 mg | ORAL_TABLET | Freq: Three times a day (TID) | ORAL | 0 refills | Status: DC

## 2018-02-05 NOTE — Assessment & Plan Note (Signed)
Swelling is under better control.  Continue his lymphedema pump.  He will return to clinic as needed.

## 2018-02-05 NOTE — Patient Instructions (Signed)

## 2018-02-05 NOTE — ED Notes (Signed)

## 2018-02-05 NOTE — Progress Notes (Signed)
MRN : [id number]  [name] Day is a 77 y.o. ([birth year]) male who presents with chief complaint of  Chief Complaint  Patient presents with  . Follow-up    3-30mofollow up  .  History of Present Illness: Patient returns today in follow up of leg swelling.  He has been using his lymphedema pump but has been less regularly using his compression stockings.  Is gotten under significantly better control in both legs and they are a lot less painful.  No skin breakdown or weeping.  Current Outpatient Medications  Medication Sig Dispense Refill  . ALPRAZolam (XANAX) 0.5 MG tablet Take 0.5 mg by mouth at bedtime as needed for anxiety.    . AMBULATORY NON FORMULARY MEDICATION BiPAP machine @ 15/12 DX: OSA DX Code: 1 each 0  . amphetamine-dextroamphetamine (ADDERALL) 20 MG tablet Take 1 tablet by mouth daily.    .Marland Kitchenaspirin 325 MG tablet Take 325 mg by mouth daily.    .Marland KitchenbuPROPion (WELLBUTRIN XL) 150 MG 24 hr tablet     . clotrimazole-betamethasone (LOTRISONE) cream Apply topically 2 (two) times daily. Apply to effected area 30 g 3  . escitalopram (LEXAPRO) 10 MG tablet     . FREESTYLE LITE test strip     . ibuprofen (ADVIL,MOTRIN) 200 MG tablet Take 400 mg by mouth every 6 (six) hours as needed.    . insulin aspart (NOVOLOG FLEXPEN) 100 UNIT/ML FlexPen Take    . insulin regular human CONCENTRATED (HUMULIN R) 500 UNIT/ML injection Inject 25 Units into the skin.    . LamoTRIgine (LAMICTAL PO) Take 50 mg by mouth 2 (two) times daily.    .Marland Kitchenlosartan (COZAAR) 50 MG tablet Take 1 tablet by mouth daily.    . metFORMIN (GLUCOPHAGE-XR) 500 MG 24 hr tablet Take 2 tablets by mouth 2 (two) times daily.    . mupirocin ointment (BACTROBAN) 2 %     . omeprazole (PRILOSEC) 40 MG capsule     . pravastatin (PRAVACHOL) 20 MG tablet Take 1 tablet by mouth daily.    . predniSONE (DELTASONE) 20 MG tablet Take 3 tablets (60 mg total) by mouth daily for 5 days. 15 tablet 0  . sertraline (ZOLOFT) 50 MG tablet  Take 1 tablet by mouth daily.    . tamsulosin (FLOMAX) 0.4 MG CAPS capsule     . TANZEUM 50 MG PEN     . valACYclovir (VALTREX) 1000 MG tablet Take 1 tablet (1,000 mg total) by mouth 3 (three) times daily. 7 tablet 0  . liraglutide (VICTOZA) 18 MG/3ML SOPN Inject into the skin.     No current facility-administered medications for this visit.     Past Medical History:  Diagnosis Date  . Anemia   . Bipolar 1 disorder ([place]   . Carotid artery occlusion   . Dementia   . Diabetes mellitus without complication ([place]   . Gait abnormality 02/12/2017  . Memory disorder 02/12/2017  . TIA (transient ischemic attack)     Past Surgical History:  Procedure Laterality Date  . COLONOSCOPY WITH PROPOFOL N/A 02/25/2017   Procedure: COLONOSCOPY WITH PROPOFOL;  Surgeon: EManya Silvas MD;  [facility];  Service: Endoscopy;  Laterality: N/A;  . ESOPHAGOGASTRODUODENOSCOPY (EGD) WITH PROPOFOL N/A 02/25/2017   Procedure: ESOPHAGOGASTRODUODENOSCOPY (EGD) WITH PROPOFOL;  Surgeon: EManya Silvas MD;  [facility];  Service: Endoscopy;  Laterality: N/A;  . NASAL SINUS SURGERY    . PACEMAKER PLACEMENT    . TONSILLECTOMY    .  TONSILLECTOMY     Social History        Tobacco Use  . Smoking status: Former Research scientist (life sciences)  . Smokeless tobacco: Never Used  Substance Use Topics  . Alcohol use: Yes    Comment: occasional  . Drug use: No     Family History Family History  Family history unknown: Yes          Allergies  Allergen Reactions  . Clindamycin/Lincomycin     "turns me purple" "turns me orange"  . Lisinopril     REACTION: Cough  . Penicillins      REVIEW OF SYSTEMS (Negative unless checked)  Constitutional: [] Weight loss  [] Fever  [] Chills Cardiac: [] Chest pain   [] Chest pressure   [] Palpitations   [] Shortness of breath when laying flat   [] Shortness of breath at rest   [] Shortness of breath with exertion. Vascular:  [] Pain in legs with  walking   [] Pain in legs at rest   [] Pain in legs when laying flat   [] Claudication   [] Pain in feet when walking  [] Pain in feet at rest  [] Pain in feet when laying flat   [] History of DVT   [] Phlebitis   [x] Swelling in legs   [x] Varicose veins   [] Non-healing ulcers Pulmonary:   [] Uses home oxygen   [] Productive cough   [] Hemoptysis   [] Wheeze  [] COPD   [] Asthma Neurologic:  [] Dizziness  [] Blackouts   [] Seizures   [] History of stroke   [] History of TIA  [] Aphasia   [] Temporary blindness   [] Dysphagia   [] Weakness or numbness in arms   [] Weakness or numbness in legs Musculoskeletal:  [x] Arthritis   [] Joint swelling   [] Joint pain   [] Low back pain Hematologic:  [] Easy bruising  [] Easy bleeding   [] Hypercoagulable state   [] Anemic   Gastrointestinal:  [] Blood in stool   [] Vomiting blood  [] Gastroesophageal reflux/heartburn   [] Abdominal pain Genitourinary:  [] Chronic kidney disease   [] Difficult urination  [] Frequent urination  [] Burning with urination   [] Hematuria Skin:  [] Rashes   [x] Ulcers   [x] Wounds Psychological:  [] History of anxiety   []  History of major depression.    Physical Examination  BP (!) 142/73 (BP Location: Right Arm)   Pulse 88   Resp 18   Ht 5' 9"  (1.753 m)   Wt 267 lb (121.1 kg)   BMI 39.43 kg/m  Gen:  WD/WN, NAD Head: [place]/AT, facial droop on left from recently diagnosed Bell's palsy Ear/Nose/Throat: Hearing grossly intact, nares w/o erythema or drainage Eyes: Conjunctiva clear. Sclera non-icteric Neck: Supple.  Trachea midline Pulmonary:  Good air movement, no use of accessory muscles.  Cardiac: irregular Vascular:  Vessel Right Left  Radial Palpable Palpable                          PT 1+ Palpable Trace Palpable  DP 1+ Palpable 1+ Palpable    Musculoskeletal: M/S 5/5 throughout.  No deformity or atrophy. Walks with a cane. 1+ BLE edema. Moderate stasis dermatitis Neurologic: Sensation grossly intact in extremities.  Symmetrical.  Speech is fluent.    Psychiatric: Judgment intact, Mood & affect appropriate for pt's clinical situation. Dermatologic: No rashes or ulcers noted.  No cellulitis or open wounds.       Labs Recent Results (from the past 2160 hour(s))  CBC     Status: Abnormal   Collection Time: 02/04/18 10:35 PM  Result Value Ref Range   WBC 5.4 3.8 - 10.6  K/uL   RBC 5.85 4.40 - 5.90 MIL/uL   Hemoglobin 12.7 (L) 13.0 - 18.0 g/dL   HCT 38.4 (L) 40.0 - 52.0 %   MCV 65.7 (L) 80.0 - 100.0 fL   MCH 21.7 (L) 26.0 - 34.0 pg   MCHC 33.1 32.0 - 36.0 g/dL   RDW 16.3 (H) 11.5 - 14.5 %   Platelets 106 (L) 150 - 440 K/uL    Comment: Performed at [facility], [address] [place], [place] [address]  Comprehensive metabolic panel     Status: Abnormal   Collection Time: 02/04/18 10:35 PM  Result Value Ref Range   Sodium 136 135 - 145 mmol/L   Potassium 3.6 3.5 - 5.1 mmol/L   Chloride 101 101 - 111 mmol/L   CO2 25 22 - 32 mmol/L   Glucose, Bld 244 (H) 65 - 99 mg/dL   BUN 13 6 - 20 mg/dL   Creatinine, Ser 0.72 0.61 - 1.24 mg/dL   Calcium 9.2 8.9 - 10.3 mg/dL   Total Protein 7.0 6.5 - 8.1 g/dL   Albumin 3.8 3.5 - 5.0 g/dL   AST 35 15 - 41 U/L   ALT 20 17 - 63 U/L   Alkaline Phosphatase 95 38 - 126 U/L   Total Bilirubin 1.9 (H) 0.3 - 1.2 mg/dL   GFR calc non Af Amer >60 >60 mL/min   GFR calc Af Amer >60 >60 mL/min    Comment: (NOTE) The eGFR has been calculated using the CKD EPI equation. This calculation has not been validated in all clinical situations. eGFR's persistently <60 mL/min signify possible Chronic Kidney Disease.    Anion gap 10 5 - 15    Comment: Performed at [facility], [address] [place], [place] [address]  Troponin I     Status: Abnormal   Collection Time: 02/04/18 10:35 PM  Result Value Ref Range   Troponin I 0.03 (HH) <0.03 ng/mL    Comment: CRITICAL RESULT CALLED TO, READ BACK BY AND VERIFIED WITH VANESSA ASHLEY @2240  02/04/18 [facility] Performed at [facility],  [address] [place], [place] [address]   TSH     Status: None   Collection Time: 02/04/18 11:59 PM  Result Value Ref Range   TSH 3.982 0.350 - 4.500 uIU/mL    Comment: Performed by a 3rd Generation assay with a functional sensitivity of <=0.01 uIU/mL. Performed at [facility], [address] [place], [place] [address]     Radiology Ct Head Wo Contrast  Result Date: 02/04/2018 CLINICAL DATA:  Left facial droop. EXAM: CT HEAD WITHOUT CONTRAST TECHNIQUE: Contiguous axial images were obtained from the base of the skull through the vertex without intravenous contrast. COMPARISON:  CT head dated May 14, 2016. FINDINGS: Brain: No evidence of acute infarction, hemorrhage, hydrocephalus, extra-axial collection or mass lesion/mass effect. Chronic lacunar infarcts in the bilateral basal ganglia again noted. Stable atrophy and chronic microvascular ischemic changes. Vascular: Atherosclerotic vascular calcification of the carotid siphons. No hyperdense vessel. Skull: Negative for fracture or focal lesion. Sinuses/Orbits: No acute finding.  Chronic right ethmoid sinusitis. Other: None. IMPRESSION: 1.  No acute intracranial abnormality. Electronically Signed   By: Titus Dubin M.D.   On: 02/04/2018 23:11    Assessment/Plan Benign essential hypertension blood pressure control important in reducing the progression of atherosclerotic disease. On appropriate oral medications.   Chronic venous insufficiency Improved with intervention.  Continue compression stockings and elevation.  Diabetes ([place]) blood glucose control important  in reducing the progression of atherosclerotic disease. Also, involved in wound healing. On appropriate medications.   Lymphedema Swelling is under better control.  Continue his lymphedema pump.  He will return to clinic as needed.    Leotis Pain, MD  02/05/2018 10:03 AM    This note was created with Dragon medical transcription system.  Any  errors from dictation are purely unintentional

## 2018-03-03 ENCOUNTER — Other Ambulatory Visit: Payer: Self-pay

## 2018-03-03 ENCOUNTER — Encounter

## 2018-03-03 ENCOUNTER — Ambulatory Visit (INDEPENDENT_AMBULATORY_CARE_PROVIDER_SITE_OTHER): Payer: Medicare Other | Admitting: Neurology

## 2018-03-03 ENCOUNTER — Encounter: Payer: Self-pay | Admitting: Neurology

## 2018-03-03 VITALS — BP 130/60 | HR 81 | Ht 69.0 in | Wt 275.0 lb

## 2018-03-03 DIAGNOSIS — G510 Bell's palsy: Secondary | ICD-10-CM

## 2018-03-03 DIAGNOSIS — E1142 Type 2 diabetes mellitus with diabetic polyneuropathy: Secondary | ICD-10-CM | POA: Diagnosis not present

## 2018-03-03 DIAGNOSIS — R269 Unspecified abnormalities of gait and mobility: Secondary | ICD-10-CM | POA: Diagnosis not present

## 2018-03-03 HISTORY — DX: Bell's palsy: G51.0

## 2018-03-03 HISTORY — DX: Type 2 diabetes mellitus with diabetic polyneuropathy: E11.42

## 2018-03-03 NOTE — Patient Instructions (Signed)
We will get physical therapy for gait training.

## 2018-03-03 NOTE — Progress Notes (Signed)
Reason for visit: Gait disorder, Bell's palsy  [name] Day is a 77 y.o. male  History of present illness:  [name] Day is a 77 year old right-handed white male with a history of bipolar disorder.  The patient has diabetes, he has a diabetic neuropathy.  The patient had a stroke event 2 years ago, he had some alteration in memory initially and some difficulty with balance.  He underwent physical therapy 18 months ago which he believes was very helpful for him.  He has continued to have some gait instability that has gradually worsened over time, he is using a cane for ambulation.  He may fall on occasion, he has not fallen recently.  He believes that his memory issues have significantly improved since the stroke, he is now able to operate a motor vehicle without difficulty.  He reports some discomfort in the feet, he claims that the use of gabapentin and Lyrica were not well-tolerated.  He is on an antidepressant but cannot remember the name of it.  He has a defibrillator in place and cannot have MRI evaluation of the brain.  He did have a CT scan of the brain done on 04 February 2018 when he presented with a left-sided Bell's palsy.  He continues to have significant weakness on the left side of the face.  The patient returns to this office for further evaluation.  The patient reports no neck or low back pain, he denies issues controlling the bowels or the bladder.  Past Medical History:  Diagnosis Date  . Anemia   . Bipolar 1 disorder ([place])   . Carotid artery occlusion   . Dementia   . Diabetes mellitus without complication ([place])   . Gait abnormality 02/12/2017  . Memory disorder 02/12/2017  . TIA (transient ischemic attack)     Past Surgical History:  Procedure Laterality Date  . COLONOSCOPY WITH PROPOFOL N/A 02/25/2017   Procedure: COLONOSCOPY WITH PROPOFOL;  Surgeon: Manya Silvas, MD;  Location: [facility] ENDOSCOPY;  Service: Endoscopy;  Laterality: N/A;  . ESOPHAGOGASTRODUODENOSCOPY  (EGD) WITH PROPOFOL N/A 02/25/2017   Procedure: ESOPHAGOGASTRODUODENOSCOPY (EGD) WITH PROPOFOL;  Surgeon: Manya Silvas, MD;  Location: [facility] ENDOSCOPY;  Service: Endoscopy;  Laterality: N/A;  . NASAL SINUS SURGERY    . PACEMAKER PLACEMENT    . TONSILLECTOMY    . TONSILLECTOMY      Family History  Family history unknown: Yes    Social history:  reports that he has quit smoking. He has never used smokeless tobacco. He reports that he drinks alcohol. He reports that he does not use drugs.  Medications:  Prior to Admission medications   Medication Sig Start Date End Date Taking? Authorizing Provider  ALPRAZolam Duanne Moron) 0.5 MG tablet Take 0.5 mg by mouth at bedtime as needed for anxiety.   Yes [provider]  AMBULATORY NON FORMULARY MEDICATION BiPAP machine @ 15/12 DX: OSA DX Code: 02/09/17  Yes Wilhelmina Mcardle, MD  amphetamine-dextroamphetamine (ADDERALL) 20 MG tablet Take 1 tablet by mouth daily. 04/21/16  Yes [provider]  aspirin 325 MG tablet Take 325 mg by mouth daily.   Yes [provider]  clotrimazole-betamethasone (LOTRISONE) cream Apply topically 2 (two) times daily. Apply to effected area 10/02/17  Yes Nickie Retort, MD  FREESTYLE LITE test strip  11/03/16  Yes [provider]  ibuprofen (ADVIL,MOTRIN) 200 MG tablet Take 400 mg by mouth every 6 (six) hours as needed.   Yes [provider]  insulin  regular human CONCENTRATED (HUMULIN R) 500 UNIT/ML injection Inject 25 Units into the skin. 11/30/15  Yes [provider]  LamoTRIgine (LAMICTAL PO) Take 50 mg by mouth 2 (two) times daily.   Yes [provider]  losartan (COZAAR) 50 MG tablet Take 1 tablet by mouth daily. 04/17/16  Yes [provider]  metFORMIN (GLUCOPHAGE-XR) 500 MG 24 hr tablet Take 2 tablets by mouth 2 (two) times daily. 04/17/16  Yes [provider]  mupirocin ointment (BACTROBAN) 2 %  03/31/17  Yes [provider]    omeprazole (PRILOSEC) 40 MG capsule  02/27/17  Yes [provider]  pravastatin (PRAVACHOL) 20 MG tablet Take 1 tablet by mouth daily. 04/07/16  Yes [provider]  tamsulosin (FLOMAX) 0.4 MG CAPS capsule  11/01/16  Yes [provider]  TANZEUM 50 MG PEN  09/07/16  Yes [provider]  valACYclovir (VALTREX) 1000 MG tablet Take 1 tablet (1,000 mg total) by mouth 3 (three) times daily. 02/05/18  Yes Gregor Hams, MD  liraglutide (VICTOZA) 18 MG/3ML SOPN Inject into the skin. 01/22/17 02/21/17  [provider]      Allergies  Allergen Reactions  . Clindamycin/Lincomycin     "turns me purple" "turns me orange"  . Lisinopril     REACTION: Cough  . Penicillins     ROS:  Out of a complete 14 system review of symptoms, the patient complains only of the following symptoms, and all other reviewed systems are negative.  Blurred vision Facial weakness Depression, anxiety  Blood pressure 130/60, pulse 81, height 5\' 9"  (1.753 m), weight 275 lb (124.7 kg), SpO2 98 %.  Physical Exam  General: The patient is alert and cooperative at the time of the examination.  The patient is markedly obese.  Eyes: Pupils are equal, round, and reactive to light. Discs are flat bilaterally.  Neck: The neck is supple, no carotid bruits are noted.  Respiratory: The respiratory examination is clear.  Cardiovascular: The cardiovascular examination reveals a regular rate and rhythm, no obvious murmurs or rubs are noted.  Skin: Extremities are with 3+ edema below the knees bilaterally.  Neurologic Exam  Mental status: The patient is alert and oriented x 3 at the time of the examination. The patient has apparent normal recent and remote memory, with an apparently normal attention span and concentration ability.  Cranial nerves: Facial symmetry is not present.  The patient has significant weakness on the left side of the face, not blinking eye on the left.  There is  good sensation of the face to pinprick and soft touch bilaterally. The strength the muscles to head turning and shoulder shrug are normal bilaterally. Speech is well enunciated, no aphasia or dysarthria is noted. Extraocular movements are full. Visual fields are full. The tongue is midline, and the patient has symmetric elevation of the soft palate. No obvious hearing deficits are noted.  Motor: The motor testing reveals 5 over 5 strength of all 4 extremities, with exception of slight foot drops bilaterally. Good symmetric motor tone is noted throughout.  Sensory: Sensory testing is intact to pinprick, soft touch, vibration sensation, and position sense on the upper extremities.  With the lower extremities there is a stocking pattern pinprick sensory deficit across the ankles bilaterally, he has symmetric vibration sensation in both feet.  Position sensation in both feet is significantly impaired.  No evidence of extinction is noted.  Coordination: Cerebellar testing reveals good finger-nose-finger and heel-to-shin bilaterally.  Gait and station:  Gait is wide-based, the patient walks with a cane.  Tandem gait was not attempted.  Romberg is negative.  Reflexes: Deep tendon reflexes are symmetric, but are depressed bilaterally. Toes are downgoing bilaterally.   CT head 02/04/18:  IMPRESSION: 1.  No acute intracranial abnormality.  * CT scan images were reviewed online. I agree with the written report.    Assessment/Plan:  1.  Mild memory disturbance  2.  History of cerebrovascular disease  3.  Diabetes with diabetic peripheral neuropathy  4.  Gait disturbance  5.  Left Bell's palsy  The patient has ongoing problems with his gait instability likely at this point related to his diabetic neuropathy.  He has severe impairment of position sense in both feet.  The patient will be set up for physical therapy for gait training, he will need to know how to cope with his worsening deficits.  He  is to continue using a cane.  He will follow-up in 6 months.  The left Bell's palsy is fairly significant, hopefully this will improve over the next several months.  Jill Alexanders MD 03/03/2018 10:58 AM  [facility] [address] [place], [place] [address]  Phone [phone] Fax [phone]

## 2018-03-09 ENCOUNTER — Ambulatory Visit (INDEPENDENT_AMBULATORY_CARE_PROVIDER_SITE_OTHER): Payer: Medicare Other | Admitting: Vascular Surgery

## 2018-03-11 ENCOUNTER — Other Ambulatory Visit: Payer: Self-pay

## 2018-03-11 ENCOUNTER — Ambulatory Visit: Payer: Medicare Other | Attending: Neurology

## 2018-03-11 DIAGNOSIS — M6281 Muscle weakness (generalized): Secondary | ICD-10-CM

## 2018-03-11 DIAGNOSIS — R2689 Other abnormalities of gait and mobility: Secondary | ICD-10-CM | POA: Diagnosis present

## 2018-03-11 DIAGNOSIS — R2681 Unsteadiness on feet: Secondary | ICD-10-CM

## 2018-03-11 NOTE — Therapy (Signed)
[place] MAIN [facility] SERVICES [address] [place], [place], [address] Phone: [phone]   Fax:  [phone]  Physical Therapy Evaluation  Patient Details  Name: [name] Day MRN: [id number] Date of Birth: [birth year] Referring Provider: Kathrynn Ducking MD   Encounter Date: 03/11/2018  PT End of Session - 03/11/18 1705    Visit Number  1    Number of Visits  16    Date for PT Re-Evaluation  05/06/18    Authorization Type  1/10 PN (7/11)    PT Start Time  1103    PT Stop Time  1158    PT Time Calculation (min)  55 min    Equipment Utilized During Treatment  Gait belt    Activity Tolerance  Patient tolerated treatment well    Behavior During Therapy  WFL for tasks assessed/performed       Past Medical History:  Diagnosis Date  . Anemia   . Bipolar 1 disorder ([place])   . Carotid artery occlusion   . Dementia   . Diabetes mellitus without complication ([place])   . Diabetic peripheral neuropathy ([place]) 03/03/2018  . Gait abnormality 02/12/2017  . Left-sided Bell's palsy 03/03/2018  . Memory disorder 02/12/2017  . TIA (transient ischemic attack)     Past Surgical History:  Procedure Laterality Date  . COLONOSCOPY WITH PROPOFOL N/A 02/25/2017   Procedure: COLONOSCOPY WITH PROPOFOL;  Surgeon: Manya Silvas, MD;  Location: [facility] ENDOSCOPY;  Service: Endoscopy;  Laterality: N/A;  . ESOPHAGOGASTRODUODENOSCOPY (EGD) WITH PROPOFOL N/A 02/25/2017   Procedure: ESOPHAGOGASTRODUODENOSCOPY (EGD) WITH PROPOFOL;  Surgeon: Manya Silvas, MD;  Location: [facility] ENDOSCOPY;  Service: Endoscopy;  Laterality: N/A;  . NASAL SINUS SURGERY    . PACEMAKER PLACEMENT    . TONSILLECTOMY    . TONSILLECTOMY      There were no vitals filed for this visit.     OUTCOME MEASURES: TEST Outcome Interpretation  5 times sit<>stand Sec Unable to perform, requires BUE support to perform 3  >78 yo, >15 sec indicates increased risk for falls  10 meter walk test         17 seconds=.59         m/s <1.0 m/s indicates increased risk for falls; limited community ambulator  ABC 8.5%   6 minute walk test                Feet 1000 feet is community Water quality scientist 36/56 <36/56 (100% risk for falls), 37-45 (80% risk for falls); 46-51 (>50% risk for falls); 52-55 (lower risk <25% of falls)       Treat   Access Code: [email or web address]  URL: [email or web address]  Date: 03/11/2018  Prepared by: Janna Arch   Exercises  Seated Heel Toe Raises - 10 reps - 1 sets - 5 hold - 1x daily - 7x weekly  Standing Marching - 10 reps - 1 sets - 5 hold - 1x daily - 7x weekly  Seated Long Arc Quad - 10 reps - 1 sets - 5 hold - 1x daily - 7x weekly   [facility] PT Assessment - 03/11/18 0001      Assessment   Medical Diagnosis  gait abnormalities    Referring Provider  Kathrynn Ducking MD    Onset Date/Surgical Date  02/10/17    Hand Dominance  Right    Prior Therapy  yes      Precautions   Precautions  Fall    Required  Braces or Orthoses  -- othotics      Restrictions   Weight Bearing Restrictions  No      Balance Screen   Has the patient fallen in the past 6 months  Yes    How many times?  2-3    Has the patient had a decrease in activity level because of a fear of falling?   Yes    Is the patient reluctant to leave their home because of a fear of falling?   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  History of cognitive impairments - at baseline bipolar, dementia      Observation/Other Assessments   Observations  L Bells Palsy, red, heated, swollen LE's bilaterally     Skin Integrity  red swollen discolored LEs    Other Surveys   Other Surveys    Activities of Balance Confidence Scale (ABC Scale)   8.75%      Observation/Other Assessments-Edema    Edema  -- bilateral noted      Sensation   Light Touch  Impaired by gross assessment;Impaired Detail    Light Touch Impaired Details  Impaired RLE;Impaired LLE    Additional Comments  stocking pattern deficit bilaterally across ankles, positional sense impaired      Coordination   Gross Motor Movements are Fluid and Coordinated  No    Coordination and Movement Description  impaired positional sense of LEs      Posture/Postural Control   Posture/Postural Control  Postural limitations    Postural Limitations  Rounded Shoulders;Forward head;Flexed trunk      ROM / Strength   AROM / PROM / Strength  Strength      Strength   Overall Strength  Deficits    Strength Assessment Site  Hip;Knee;Ankle    Right/Left Hip  Right;Left    Right Hip Flexion  3+/5    Right Hip Extension  2+/5    Right Hip ABduction  4-/5    Right Hip ADduction  3+/5    Left Hip Flexion  4-/5    Left Hip Extension  2+/5    Left Hip ABduction  4-/5    Left Hip ADduction  3+/5    Right/Left Knee  Right;Left    Right Knee Flexion  3+/5    Right Knee Extension  3+/5    Left Knee Flexion  3+/5    Left Knee Extension  3+/5    Right/Left Ankle  Right;Left    Right Ankle Dorsiflexion  2+/5    Right Ankle Plantar Flexion  2+/5    Left Ankle Inversion  2+/5    Left Ankle Eversion  2+/5      Flexibility   Soft Tissue Assessment Lindaann Slough  Length  yes    Hamstrings  limited    Quadriceps  limited rectus femorus and illiopsoas      Transfers   Transfers  Sit to Stand;Stand to  Sit    Sit to Stand  4: Min guard;With armrests    Five time sit to stand comments   unable to perform 5 consecutive    Stand to Sit  4: Min guard;With armrests    Number of Reps  Other reps (comment) 3    Comments  excessive UE support from handrails,       Ambulation/Gait   Ambulation/Gait  Yes    Ambulation/Gait Assistance  4: Min guard    Ambulation Distance (Feet)  30 Feet    Assistive device  Straight cane    Gait Pattern  Step-through pattern;Decreased stride length;Right foot flat;Left foot flat;Trunk flexed;Poor foot clearance - left;Poor foot clearance - right    Ambulation Surface  Level;Indoor    Gait velocity  .73m/s    Stairs  Yes    Stairs Assistance  4: Min guard    Stair Management Technique  Two rails;Step to pattern    Number of Stairs  4    Height of Stairs  6      Balance   Balance Assessed  Yes      Static Sitting Balance   Static Sitting - Balance Support  No upper extremity supported;Feet supported      Dynamic Sitting Balance   Dynamic Sitting - Balance Support  Feet supported    Dynamic Sitting - Level of Assistance  6: Modified independent (Device/Increase time)    Dynamic Sitting - Balance Activities  Lateral lean/weight shifting;Forward lean/weight shifting;Reaching for objects      Static Standing Balance   Static Standing - Balance Support  Right upper extremity supported;During functional activity    Static Standing - Level of Assistance  5: Stand by assistance    Static Standing Balance -  Activities   Tandam Stance - Left Leg;Tandam Stance - Right Leg      Dynamic Standing Balance   Dynamic Standing - Balance Support  Right upper extremity supported;During functional activity    Dynamic Standing - Level of Assistance  5: Stand by assistance    Dynamic Standing - Balance Activities  Alternatig foot taps;Reaching for objects    Dynamic Standing - Comments  need CGA      Standardized Balance Assessment   Standardized Balance Assessment  Berg  Balance Test      Berg Balance Test   Sit to Stand  Able to stand using hands after several tries    Standing Unsupported  Able to stand 2 minutes with supervision    Sitting with Back Unsupported but Feet Supported on Floor or Stool  Able to sit safely and securely 2 minutes    Stand to Sit  Controls descent by using hands    Transfers  Able to transfer safely, definite need of hands    Standing Unsupported with Eyes Closed  Able to stand 10 seconds with supervision    Standing Ubsupported with Feet Together  Able to place feet together independently and stand for 1 minute with supervision    From Standing, Reach Forward with Outstretched Arm  Can reach forward >12 cm safely (5")    From Standing Position, Pick up Object from [place] to pick up shoe safely and easily    From Standing Position, Turn to Look Behind Over each Shoulder  Looks behind one side only/other side shows less weight shift    Turn 360 Degrees  Able to turn 360 degrees safely but slowly    Standing Unsupported, Alternately Place Feet on Step/Stool  Able to complete >2 steps/needs minimal assist    Standing Unsupported, One Foot in Front  Able to take small step independently and hold 30 seconds    Standing on One Leg  Unable to try or needs assist to prevent fall    Total Score  36                Objective measurements completed on examination: See above findings.              PT Education - 03/11/18 1705    Education Details  HEP, POC, balance,     Person(s) Educated  Patient    Methods  Explanation;Demonstration;Verbal cues;Handout    Comprehension  Verbalized understanding;Returned demonstration;Need further instruction       PT Short Term Goals - 03/11/18 1714      PT SHORT TERM GOAL #1   Title  Patient will be independent in home exercise program to improve strength/mobility for better functional independence with ADLs.    Baseline  7/11: HEP given    Time  2    Period  Weeks     Status  New    Target Date  03/25/18      PT SHORT TERM GOAL #2   Title  Patient will perform 5 consecutive sit to stands in a row for improved mobility.     Baseline  7/11: able to perform 3    Time  2    Period  Weeks    Status  New    Target Date  03/25/18      PT SHORT TERM GOAL #3   Title   Patient will deny any falls over past 2 weeks to demonstrate improved safety awareness at home and work.     Baseline  2 falls    Time  2    Period  Weeks    Status  New    Target Date  03/25/18        PT Long Term Goals - 03/11/18 1715      PT LONG TERM GOAL #1   Title  Patient (> 28 years old) will complete five times sit to stand test in < 15 seconds indicating an increased LE strength and improved balance.    Baseline  7/11: unable to perform  more than 3     Time  8    Period  Weeks    Status  New    Target Date  05/06/18      PT LONG TERM GOAL #2   Title  Patient will increase 10 meter walk test to >1.19m/s as to improve gait speed for better community ambulation and to reduce fall risk.    Baseline  7/11: .59 m/s with cane    Time  8    Period  Weeks    Status  New    Target Date  05/06/18      PT LONG TERM GOAL #3   Title  Patient will increase ABC scale score >50% to demonstrate better functional mobility and better confidence with ADLs.     Baseline  7/11: 8.5%    Time  8    Period  Weeks    Status  New    Target Date  05/06/18  PT LONG TERM GOAL #4   Title  Patient will increase Berg Balance score by > 6 points (42/56)  to demonstrate decreased fall risk during functional activities.    Baseline  7/11: 36/56    Time  8    Period  Weeks    Status  New    Target Date  05/06/18      PT LONG TERM GOAL #5   Title  Patient will increase BLE gross strength to 4+/5 as to improve functional strength for independent gait, increased standing tolerance and increased ADL ability    Baseline  7/11: 3+/5 gross    Time  8    Period  Weeks    Status  New    Target  Date  05/06/18             Plan - 03/11/18 1707    Clinical Impression Statement  Patient is a pleasant 77 year old male who presents to physical therapy for gait abnormalities secondary to history of TIA. Patient has L Bells Palsy with R body tilt with prolonged standing. Bilateral neuropathy with decreased sensation impairs patient's ability to determine where his foot is in place limiting his coordination. Poor dynamic stability noted with patient having high fear of loss of balance resulting in increased muscle tension with more challenging tasks. Transfers from sit to stand requires excessive UE support from chair arms with patient being unable to perform 5x STS. 10 MWT=.24m/s, BERG 36/56, and ABC=8.5%. Patient will benefit from skilled physical therapy to improve strength, balance, and mobility to decrease fall risk and improve functional ambulation and transfers    History and Personal Factors relevant to plan of care:  This patient presents with  3, personal factors/ comorbidities  and 4  body elements including body structures and functions, activity limitations and or participation restrictions. Patient's condition is  unstable    Clinical Presentation  Unstable    Clinical Presentation due to:  bipolor disorder, Bells Palsy, dementia, neuropathy, hx of TIA     Clinical Decision Making  High    Rehab Potential  Fair    Clinical Impairments Affecting Rehab Potential  (+) motivation, previous success of therapy (-) age, dementia    PT Frequency  2x / week    PT Duration  8 weeks    PT Treatment/Interventions  ADLs/Self Care Home Management;Aquatic Therapy;Cryotherapy;Ultrasound;Traction;Moist Heat;Electrical Stimulation;DME Instruction;Gait training;Stair training;Functional mobility training;Neuromuscular re-education;Balance training;Therapeutic exercise;Therapeutic activities;Patient/family education;Orthotic Fit/Training;Manual techniques;Dry needling;Passive range of motion;Manual  lymph drainage;Energy conservation;Splinting;Taping;Vestibular;Visual/perceptual remediation/compensation    PT Next Visit Plan  review HEP, add exercises, standing balance    PT Home Exercise Plan  see sheet    Consulted and Agree with Plan of Care  Patient       Patient will benefit from skilled therapeutic intervention in order to improve the following deficits and impairments:  Abnormal gait, Cardiopulmonary status limiting activity, Decreased activity tolerance, Decreased balance, Decreased endurance, Decreased coordination, Decreased cognition, Decreased mobility, Decreased safety awareness, Difficulty walking, Decreased strength, Increased edema, Impaired flexibility, Impaired perceived functional ability, Impaired sensation, Postural dysfunction, Improper body mechanics  Visit Diagnosis: Other abnormalities of gait and mobility  Unsteadiness on feet  Muscle weakness (generalized)     Problem List Patient Active Problem List   Diagnosis Date Noted  . Left-sided Bell's palsy 03/03/2018  . Diabetic peripheral neuropathy ([place]) 03/03/2018  . Abnormal LFTs 09/08/2017  . 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 Diabetes mellitus type 2, uncomplicated ([place]) 51/88/4166  . ED (erectile dysfunction)  09/08/2017  . Environmental allergies 09/08/2017  . Nephrolithiasis 09/08/2017  . Non-cardiac chest pain 09/08/2017  . Rectal bleeding 09/08/2017  . Sinusitis 09/08/2017  . Varicose veins of leg with swelling, bilateral 06/12/2017  . Venous ulcer ([place]) 05/18/2017  . Chronic venous insufficiency 05/18/2017  . Memory disorder 02/12/2017  . Gait abnormality 02/12/2017  . CKD (chronic kidney disease), stage III ([place]) 02/06/2017  . Hyperlipidemia 02/06/2017  . Swelling of limb 02/06/2017  . Lymphedema 02/06/2017  . Heme positive stool 12/04/2016  . Mixed dementia 07/29/2016  . Cardiac pacemaker in situ 04/27/2016  . AV heart block 04/24/2016  . Hyperglycemia 04/24/2016  . Generalized  weakness 04/24/2016  . Syncope and collapse 04/24/2016  . Metal bone fixation hardware in place 04/21/2016  . Bilateral carotid artery stenosis 02/05/2015  . OSA (obstructive sleep apnea) 02/05/2015  . Benign essential hypertension 01/19/2015  . B12 deficiency 05/23/2014  . 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 Long-term insulin use ([place]) 05/22/2014  . Type II diabetes mellitus with manifestations, uncontrolled ([place]) 05/22/2014  . Aortic stenosis, moderate 11/14/2013  . Diabetes ([place]) 06/22/2007  . ANEMIA-NOS 03/29/2007  . ANXIETY 03/29/2007  . DEPRESSION 03/29/2007  . ALLERGIC RHINITIS 03/29/2007  . ASTHMA 03/29/2007  . OSTEOARTHRITIS 03/29/2007  . NEPHROLITHIASIS, HX OF 03/29/2007   Janna Arch, PT, DPT   03/11/2018, 5:20 PM  [place] MAIN [facility] SERVICES [address] [place], [place], [address] Phone: [phone]   Fax:  [phone]  Name: CARSTEN CARSTARPHEN MRN: [id number] Date of Birth: 01-31-1941

## 2018-03-11 NOTE — Patient Instructions (Signed)
Access Code: [email or web address]  URL: [email or web address]  Date: 03/11/2018  Prepared by: Janna Arch   Exercises  Seated Heel Toe Raises - 10 reps - 1 sets - 5 hold - 1x daily - 7x weekly  Standing Marching - 10 reps - 1 sets - 5 hold - 1x daily - 7x weekly  Seated Long Arc Quad - 10 reps - 1 sets - 5 hold - 1x daily - 7x weekly

## 2018-03-15 ENCOUNTER — Ambulatory Visit: Payer: Medicare Other

## 2018-03-15 ENCOUNTER — Ambulatory Visit: Payer: Medicare Other | Admitting: Physical Therapy

## 2018-03-15 ENCOUNTER — Encounter (INDEPENDENT_AMBULATORY_CARE_PROVIDER_SITE_OTHER): Payer: Self-pay | Admitting: Vascular Surgery

## 2018-03-15 ENCOUNTER — Ambulatory Visit (INDEPENDENT_AMBULATORY_CARE_PROVIDER_SITE_OTHER): Payer: Medicare Other | Admitting: Vascular Surgery

## 2018-03-15 VITALS — BP 134/64 | HR 71 | Resp 17 | Ht 70.0 in | Wt 272.0 lb

## 2018-03-15 DIAGNOSIS — I890 Lymphedema, not elsewhere classified: Secondary | ICD-10-CM

## 2018-03-15 DIAGNOSIS — E785 Hyperlipidemia, unspecified: Secondary | ICD-10-CM

## 2018-03-15 DIAGNOSIS — E1142 Type 2 diabetes mellitus with diabetic polyneuropathy: Secondary | ICD-10-CM | POA: Diagnosis not present

## 2018-03-15 DIAGNOSIS — R2681 Unsteadiness on feet: Secondary | ICD-10-CM

## 2018-03-15 DIAGNOSIS — M6281 Muscle weakness (generalized): Secondary | ICD-10-CM

## 2018-03-15 DIAGNOSIS — R2689 Other abnormalities of gait and mobility: Secondary | ICD-10-CM

## 2018-03-15 NOTE — Progress Notes (Signed)
Subjective:    Patient ID: [name] Day, male    DOB: 05-07-1941, 76 y.o.   MRN: [id number] Chief Complaint  Patient presents with  . Follow-up    Bilateral foot swelling   Patient last seen on February 05, 2018 and evaluation of bilateral lower extremity edema.  At the time, the patient seemed to be doing well with conservative therapy including medical grade 1 compression socks, elevation, activity and a lymphedema pump.  The patient presents today with approximately 2 weeks of worsening bilateral lower extremity edema.  The edema is associated with some discomfort.  The patient has a past medical history including numerous lymphedema exacerbations with ulcer formation requiring Unna boot therapy.  The patient became concerned with the progressively worsening edema and called the office for evaluation.  The patient denies any recent surgery or trauma to the bilateral lower extremity or medication changes.  The patient denies any ulceration to the bilateral legs.  The patient denies any erythema.  Patient denies any fever, nausea vomiting.  The patient notes that he has not been wearing his medical grade 1 compression socks on a daily basis.  The patient also states that he is in the process of getting a lymphedema pump and he has not received it yet.  Review of Systems  Constitutional: Negative.   HENT: Negative.   Eyes: Negative.   Respiratory: Negative.   Cardiovascular: Positive for leg swelling.  Gastrointestinal: Negative.   Endocrine: Negative.   Genitourinary: Negative.   Musculoskeletal: Negative.   Skin: Negative.   Allergic/Immunologic: Negative.   Neurological: Negative.   Hematological: Negative.   Psychiatric/Behavioral: Negative.       Objective:   Physical Exam  Constitutional: He is oriented to person, place, and time. He appears well-developed and well-nourished. No distress.  HENT:  Head: Normocephalic and atraumatic.  Right Ear: External ear normal.  Left  Ear: External ear normal.  Eyes: Pupils are equal, round, and reactive to light. Conjunctivae are normal.  Neck: Normal range of motion.  Cardiovascular: Normal rate, regular rhythm, normal heart sounds and intact distal pulses.  Pulses:      Radial pulses are 2+ on the right side, and 2+ on the left side.  Hard to palpate pedal pulses due to body habitus and edema however the bilateral feet are warm.  There are no ulcerations or cellulitis to the bilateral feet.  Pulmonary/Chest: Effort normal and breath sounds normal.  Musculoskeletal: Normal range of motion. He exhibits edema (Moderate nonpitting edema noted bilaterally).  Neurological: He is alert and oriented to person, place, and time.  Skin: He is not diaphoretic.  Patient with severe stasis dermatitis of the bilateral lower extremity.  There is no active cellulitis or ulcerations noted to the lower extremity.  Psychiatric: He has a normal mood and affect. His behavior is normal. Judgment and thought content normal.  Vitals reviewed.  BP 134/64 (BP Location: Right Arm, Patient Position: Sitting)   Pulse 71   Resp 17   Ht 5\' 10"  (1.778 m)   Wt 272 lb (123.4 kg)   BMI 39.03 kg/m   Past Medical History:  Diagnosis Date  . Anemia   . Bipolar 1 disorder ([place])   . Carotid artery occlusion   . Dementia   . Diabetes mellitus without complication ([place])   . Diabetic peripheral neuropathy ([place]) 03/03/2018  . Gait abnormality 02/12/2017  . Left-sided Bell's palsy 03/03/2018  . Memory disorder 02/12/2017  . TIA (transient ischemic attack)  Social History   Socioeconomic History  . Marital status: Widowed    Spouse name: Not on file  . Number of children: 2  . Years of education: 45  . Highest education level: Not on file  Occupational History  . Occupation: Retired  Scientific laboratory technician  . Financial resource strain: Not on file  . Food insecurity:    Worry: Not on file    Inability: Not on file  . Transportation needs:    Medical:  Not on file    Non-medical: Not on file  Tobacco Use  . Smoking status: Former Research scientist (life sciences)  . Smokeless tobacco: Never Used  Substance and Sexual Activity  . Alcohol use: Yes    Comment: occasional  . Drug use: No  . Sexual activity: Not on file  Lifestyle  . Physical activity:    Days per week: Not on file    Minutes per session: Not on file  . Stress: Not on file  Relationships  . Social connections:    Talks on phone: Not on file    Gets together: Not on file    Attends religious service: Not on file    Active member of club or organization: Not on file    Attends meetings of clubs or organizations: Not on file    Relationship status: Not on file  . Intimate partner violence:    Fear of current or ex partner: Not on file    Emotionally abused: Not on file    Physically abused: Not on file    Forced sexual activity: Not on file  Other Topics Concern  . Not on file  Social History Narrative   Lives with [name]   Caffeine use: caffeine free soda daily   Right handed   Past Surgical History:  Procedure Laterality Date  . COLONOSCOPY WITH PROPOFOL N/A 02/25/2017   Procedure: COLONOSCOPY WITH PROPOFOL;  Surgeon: Manya Silvas, MD;  Location[facility] ENDOSCOPY;  Service: Endoscopy;  Laterality: N/A;  . ESOPHAGOGASTRODUODENOSCOPY (EGD) WITH PROPOFOL N/A 02/25/2017   Procedure: ESOPHAGOGASTRODUODENOSCOPY (EGD) WITH PROPOFOL;  Surgeon: Manya Silvas, MD;  Location: [facility] ENDOSCOPY;  Service: Endoscopy;  Laterality: N/A;  . NASAL SINUS SURGERY    . PACEMAKER PLACEMENT    . TONSILLECTOMY    . TONSILLECTOMY     Family History  Family history unknown: Yes   Allergies  Allergen Reactions  . Clindamycin/Lincomycin     "turns me purple" "turns me orange"  . Lisinopril     REACTION: Cough  . Penicillins       Assessment & Plan:  Patient last seen on February 05, 2018 and evaluation of bilateral lower extremity edema.  At the time, the patient seemed to be doing well with  conservative therapy including medical grade 1 compression socks, elevation, activity and a lymphedema pump.  The patient presents today with approximately 2 weeks of worsening bilateral lower extremity edema.  The edema is associated with some discomfort.  The patient has a past medical history including numerous lymphedema exacerbations with ulcer formation requiring Unna boot therapy.  The patient became concerned with the progressively worsening edema and called the office for evaluation.  The patient denies any recent surgery or trauma to the bilateral lower extremity or medication changes.  The patient denies any ulceration to the bilateral legs.  The patient denies any erythema.  Patient denies any fever, nausea vomiting.  The patient notes that he has not been wearing his medical grade 1 compression socks  on a daily basis.  The patient also states that he is in the process of getting a lymphedema pump and he has not received it yet.  1. Lymphedema - Stable Patient presents with a bilateral lower extremity lymphedema exacerbation due to noncompliance with conservative therapy. In an attempt to gain control of the patient's edema I recommend bilateral 3 layer of zinc oxide Unna wraps changed weekly for approximately 1 month The patient is to follow-up in 1 month so I can assess his progress with Unna boot therapy We had a long discussion and reviewed appropriate elevation as heart level or higher during the day as much as possible The patient is to remain active The patient understands that once he receives his lymphedema pump it is okay to use the pump on top of either the Unna boots or his compression socks If his edema is controlled and a month I will then transition him back into medical grade 1 compression socks The patient expresses understanding  2. Diabetic peripheral neuropathy (HCC) - Stable Encouraged good control as its slows the progression of atherosclerotic disease  3.  Hyperlipidemia, unspecified hyperlipidemia type - Stable Encouraged good control as its slows the progression of atherosclerotic disease  Current Outpatient Medications on File Prior to Visit  Medication Sig Dispense Refill  . allopurinol (ZYLOPRIM) 300 MG tablet Take by mouth.    . ALPRAZolam (XANAX) 0.5 MG tablet Take 0.5 mg by mouth at bedtime as needed for anxiety.    . AMBULATORY NON FORMULARY MEDICATION BiPAP machine @ 15/12 DX: OSA DX Code: 1 each 0  . amphetamine-dextroamphetamine (ADDERALL) 20 MG tablet Take 1 tablet by mouth daily.    Marland Kitchen aspirin 325 MG tablet Take 325 mg by mouth daily.    . clotrimazole-betamethasone (LOTRISONE) cream Apply topically 2 (two) times daily. Apply to effected area 30 g 3  . desonide (DESOWEN) 0.05 % cream Apply topically.    Marland Kitchen FREESTYLE LITE test strip     . gabapentin (NEURONTIN) 100 MG capsule Take by mouth.    Marland Kitchen ibuprofen (ADVIL,MOTRIN) 200 MG tablet Take 400 mg by mouth every 6 (six) hours as needed.    . insulin regular human CONCENTRATED (HUMULIN R) 500 UNIT/ML injection Inject 25 Units into the skin.    . LamoTRIgine (LAMICTAL PO) Take 50 mg by mouth 2 (two) times daily.    Marland Kitchen losartan (COZAAR) 50 MG tablet Take 1 tablet by mouth daily.    . Lurasidone HCl (LATUDA) 60 MG TABS Take by mouth.    . metFORMIN (GLUCOPHAGE-XR) 500 MG 24 hr tablet Take 2 tablets by mouth 2 (two) times daily.    . mupirocin ointment (BACTROBAN) 2 %     . omeprazole (PRILOSEC) 40 MG capsule     . pravastatin (PRAVACHOL) 20 MG tablet Take 1 tablet by mouth daily.    . tamsulosin (FLOMAX) 0.4 MG CAPS capsule     . TANZEUM 50 MG PEN     . valACYclovir (VALTREX) 1000 MG tablet Take 1 tablet (1,000 mg total) by mouth 3 (three) times daily. 7 tablet 0  . liraglutide (VICTOZA) 18 MG/3ML SOPN Inject into the skin.     No current facility-administered medications on file prior to visit.    There are no Patient Instructions on file for this visit. No follow-ups on  file.  KIMBERLY A STEGMAYER, PA-C

## 2018-03-15 NOTE — Therapy (Signed)
[place] MAIN [facility] SERVICES [address] [place], [place], [address] Phone: [phone]   Fax:  [phone]  Physical Therapy Treatment  Patient Details  Name: [name] Day MRN: [id number] Date of Birth: [birth year] Referring Provider: Kathrynn Ducking MD   Encounter Date: 03/15/2018  PT End of Session - 03/15/18 1523    Visit Number  2    Number of Visits  16    Date for PT Re-Evaluation  05/06/18    Authorization Type  2/10 PN (7/11)    PT Start Time  1515    PT Stop Time  1559    PT Time Calculation (min)  44 min    Equipment Utilized During Treatment  Gait belt    Activity Tolerance  Patient tolerated treatment well    Behavior During Therapy  WFL for tasks assessed/performed       Past Medical History:  Diagnosis Date  . Anemia   . Bipolar 1 disorder ([place])   . Carotid artery occlusion   . Dementia   . Diabetes mellitus without complication ([name])   . Diabetic peripheral neuropathy ([place]) 03/03/2018  . Gait abnormality 02/12/2017  . Left-sided Bell's palsy 03/03/2018  . Memory disorder 02/12/2017  . TIA (transient ischemic attack)     Past Surgical History:  Procedure Laterality Date  . COLONOSCOPY WITH PROPOFOL N/A 02/25/2017   Procedure: COLONOSCOPY WITH PROPOFOL;  Surgeon: Manya Silvas, MD;  Location: [facility] ENDOSCOPY;  Service: Endoscopy;  Laterality: N/A;  . ESOPHAGOGASTRODUODENOSCOPY (EGD) WITH PROPOFOL N/A 02/25/2017   Procedure: ESOPHAGOGASTRODUODENOSCOPY (EGD) WITH PROPOFOL;  Surgeon: Manya Silvas, MD;  Location: [facility] ENDOSCOPY;  Service: Endoscopy;  Laterality: N/A;  . NASAL SINUS SURGERY    . PACEMAKER PLACEMENT    . TONSILLECTOMY    . TONSILLECTOMY      There were no vitals filed for this visit.  Subjective Assessment - 03/15/18 1521    Subjective  Patient reports that he went to vascular doctor and is back in the [employer]. Reports that it is feeling better in his feet now.     Pertinent History   [name] Day is a 77 year old right-handed male with a history of bipolar disorder, anemia, carotid artery occlusion, dementia, DM, and TIA.  He additionally has diabetic neuropathy.  The patient had a stroke event 2 years ago, he had some alteration in memory initially and some difficulty with balance.  He underwent physical therapy 18 months ago which he believes was very helpful for him.  He has continued to have some gait instability that has gradually worsened over time, he is using a cane for ambulation.  He may fall on occasion, he has not fallen recently.  He believes that his memory issues have significantly improved since the stroke, he is now able to operate a motor vehicle without difficulty.    He has a defibrillator in place and cannot have MRI evaluation of the brain.  He did have a CT scan of the brain done on 04 February 2018 when he presented with a left-sided Bell's palsy.  He continues to have significant weakness on the left side of the face. Wants to improve stability in yard for gardening, Wants to get better at stair negotiation; getting up from tables to not knock over table, and getting up and down from lower chairs.     Limitations  Lifting;Standing;Walking;House hold activities;Other (comment)    How long can you sit comfortably?  n/a  How long can you stand comfortably?  10 minutes; gets lean to R     How long can you walk comfortably?  10 minutes    Diagnostic tests  imaging    Patient Stated Goals  Wants to improve stability in yard for gardeining, Wants to get better at stair negotiation; getting up from tables to not knock over table,     Currently in Pain?  No/denies       Nustep Lvl 5 RPM >60 for cardiovascular support   Airex pad Rhomberg head nods up and down in // bars 3x30 seconds  Airex pad; Rhomberg head turns: horizontal in // bars 3x30 seconds  Balloon taps 2 minutes reaching inside and outside BOS without LOB, occasional touching of hands for  re-stabilization  6" step toe taps in // bars 20x  With knubby bosus for coordination and accuracy of foot placement.   Side step toe tap 6" step 15x each leg with cues for keeping toes forward for lateral musculature activation.   Orange hurdle step over 8x each foot : left leg challenge  Ambulate with head turns in hallway reading cards 200 ft with SPC and CGA; frequent stops due to difficulty reading cards.    Pt. response to medical necessity: Patient will benefit from skilled physical therapy to improve strength, balance, and mobility to decrease fall risk and improve functional ambulation and transfers                     PT Education - 03/15/18 1522    Education Details  mobility, stability.     Person(s) Educated  Patient    Methods  Explanation;Demonstration;Verbal cues    Comprehension  Verbalized understanding;Returned demonstration       PT Short Term Goals - 03/11/18 1714      PT SHORT TERM GOAL #1   Title  Patient will be independent in home exercise program to improve strength/mobility for better functional independence with ADLs.    Baseline  7/11: HEP given    Time  2    Period  Weeks    Status  New    Target Date  03/25/18      PT SHORT TERM GOAL #2   Title  Patient will perform 5 consecutive sit to stands in a row for improved mobility.     Baseline  7/11: able to perform 3    Time  2    Period  Weeks    Status  New    Target Date  03/25/18      PT SHORT TERM GOAL #3   Title   Patient will deny any falls over past 2 weeks to demonstrate improved safety awareness at home and work.     Baseline  2 falls    Time  2    Period  Weeks    Status  New    Target Date  03/25/18        PT Long Term Goals - 03/11/18 1715      PT LONG TERM GOAL #1   Title  Patient (> 45 years old) will complete five times sit to stand test in < 15 seconds indicating an increased LE strength and improved balance.    Baseline  7/11: unable to perform  more  than 3     Time  8    Period  Weeks    Status  New    Target Date  05/06/18  PT LONG TERM GOAL #2   Title  Patient will increase 10 meter walk test to >1.12m/s as to improve gait speed for better community ambulation and to reduce fall risk.    Baseline  7/11: .59 m/s with cane    Time  8    Period  Weeks    Status  New    Target Date  05/06/18      PT LONG TERM GOAL #3   Title  Patient will increase ABC scale score >50% to demonstrate better functional mobility and better confidence with ADLs.     Baseline  7/11: 8.5%    Time  8    Period  Weeks    Status  New    Target Date  05/06/18      PT LONG TERM GOAL #4   Title  Patient will increase Berg Balance score by > 6 points (42/56)  to demonstrate decreased fall risk during functional activities.    Baseline  7/11: 36/56    Time  8    Period  Weeks    Status  New    Target Date  05/06/18      PT LONG TERM GOAL #5   Title  Patient will increase BLE gross strength to 4+/5 as to improve functional strength for independent gait, increased standing tolerance and increased ADL ability    Baseline  7/11: 3+/5 gross    Time  8    Period  Weeks    Status  New    Target Date  05/06/18            Plan - 03/15/18 1616    Clinical Impression Statement  Patient demonstrates lack of coordination of LLE with difficulty placing limb and understanding where limb is in space. Weakness of LE's in addition to lack of coordination results in poor clearance of LE's during standing tasks. Patient will benefit from skilled physical therapy to improve strength, balance, and mobility to decrease fall risk and improve functional ambulation and transfers    Rehab Potential  Fair    Clinical Impairments Affecting Rehab Potential  (+) motivation, previous success of therapy (-) age, dementia    PT Frequency  2x / week    PT Duration  8 weeks    PT Treatment/Interventions  ADLs/Self Care Home Management;Aquatic  Therapy;Cryotherapy;Ultrasound;Traction;Moist Heat;Electrical Stimulation;DME Instruction;Gait training;Stair training;Functional mobility training;Neuromuscular re-education;Balance training;Therapeutic exercise;Therapeutic activities;Patient/family education;Orthotic Fit/Training;Manual techniques;Dry needling;Passive range of motion;Manual lymph drainage;Energy conservation;Splinting;Taping;Vestibular;Visual/perceptual remediation/compensation    PT Next Visit Plan  review HEP, add exercises, standing balance    PT Home Exercise Plan  see sheet    Consulted and Agree with Plan of Care  Patient       Patient will benefit from skilled therapeutic intervention in order to improve the following deficits and impairments:  Abnormal gait, Cardiopulmonary status limiting activity, Decreased activity tolerance, Decreased balance, Decreased endurance, Decreased coordination, Decreased cognition, Decreased mobility, Decreased safety awareness, Difficulty walking, Decreased strength, Increased edema, Impaired flexibility, Impaired perceived functional ability, Impaired sensation, Postural dysfunction, Improper body mechanics  Visit Diagnosis: Other abnormalities of gait and mobility  Muscle weakness (generalized)  Unsteadiness on feet     Problem List Patient Active Problem List   Diagnosis Date Noted  . Left-sided Bell's palsy 03/03/2018  . Diabetic peripheral neuropathy ([place]) 03/03/2018  . Abnormal LFTs 09/08/2017  . 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 Diabetes mellitus type 2, uncomplicated ([place]) 16/06/9603  . ED (erectile dysfunction) 09/08/2017  . Environmental allergies 09/08/2017  .  Nephrolithiasis 09/08/2017  . Non-cardiac chest pain 09/08/2017  . Rectal bleeding 09/08/2017  . Sinusitis 09/08/2017  . Varicose veins of leg with swelling, bilateral 06/12/2017  . Venous ulcer ([place]) 05/18/2017  . Chronic venous insufficiency 05/18/2017  . Memory disorder 02/12/2017  . Gait abnormality  02/12/2017  . CKD (chronic kidney disease), stage III ([place]) 02/06/2017  . Hyperlipidemia 02/06/2017  . Swelling of limb 02/06/2017  . Lymphedema 02/06/2017  . Heme positive stool 12/04/2016  . Mixed dementia 07/29/2016  . Cardiac pacemaker in situ 04/27/2016  . AV heart block 04/24/2016  . Hyperglycemia 04/24/2016  . Generalized weakness 04/24/2016  . Syncope and collapse 04/24/2016  . Metal bone fixation hardware in place 04/21/2016  . Bilateral carotid artery stenosis 02/05/2015  . OSA (obstructive sleep apnea) 02/05/2015  . Benign essential hypertension 01/19/2015  . B12 deficiency 05/23/2014  . 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 Long-term insulin use ([place]) 05/22/2014  . Type II diabetes mellitus with manifestations, uncontrolled ([place]) 05/22/2014  . Aortic stenosis, moderate 11/14/2013  . Diabetes ([place]) 06/22/2007  . ANEMIA-NOS 03/29/2007  . ANXIETY 03/29/2007  . DEPRESSION 03/29/2007  . ALLERGIC RHINITIS 03/29/2007  . ASTHMA 03/29/2007  . OSTEOARTHRITIS 03/29/2007  . NEPHROLITHIASIS, HX OF 03/29/2007   Janna Arch, PT, DPT   03/15/2018, 4:18 PM  [place] MAIN [facility] SERVICES [address] [place], [place], [address] Phone: [phone]   Fax:  [phone]  Name: MADOX CORKINS MRN: [id number] Date of Birth: 01/30/1941

## 2018-03-17 ENCOUNTER — Encounter: Payer: Self-pay | Admitting: Physical Therapy

## 2018-03-17 ENCOUNTER — Ambulatory Visit: Payer: Medicare Other | Admitting: Physical Therapy

## 2018-03-17 DIAGNOSIS — M6281 Muscle weakness (generalized): Secondary | ICD-10-CM

## 2018-03-17 DIAGNOSIS — R2689 Other abnormalities of gait and mobility: Secondary | ICD-10-CM | POA: Diagnosis not present

## 2018-03-17 DIAGNOSIS — R2681 Unsteadiness on feet: Secondary | ICD-10-CM

## 2018-03-17 NOTE — Patient Instructions (Signed)
Squat: Back  Squat    Keeping feet flat on floor shoulder width apart, trunk straight, squat bending knees 30. Hold position __5__ seconds. Repeat _10___ times per session. Do ___2_ sessions per day.  Copyright  VHI. All rights reserved.  s.  Copyright  VHI. All rights reserved.  Bridge    Lying on back, legs bent 90, feet flat on floor. Press up hips and torso, reaching hands to feet. Hold for __5__ breaths. ADVANCED: Clasp hands underneath back and squeeze shoulder blades together, lifting upper body onto outside of shoulders.  Copyright  VHI. All rights reserved.  HIP: Abduction - Standing (Band)    Place band around legs. Squeeze glutes. Raise leg out and slightly back. . Use ___red_____ band. __15_ reps per set, __2_ sets per day, __7_ days per week Hold onto a support.  Copyright  VHI. All rights reserved.  Hip Abduction: Modified    Lying on right side with pillow between thighs, raise top leg from pillow, rotating slightly out. Repeat __15__ times per set. Do _2___ sets per session. Do __7__ sessions per day.  http://orth.exer.us/704   Copyright  VHI. All rights reserved.  Hip Extension (All-Fours)    Lift right leg back with knee slightly flexed. Do not arch neck or back. Repeat ___10_ times per set. Do __1__ sets per session. Do _1___ sessions per day.  http://orth.exer.us/106   Copyright  VHI. All rights reserved.  Hip Extension: 2-4 Inches    Tighten gluteal muscle. Lift one leg _15__ times. Restabilize pelvis. Repeat with other leg. Keep pelvis still. Be sure pelvis does not rotate and back does not arch. Do _1__ sets, __1_ times per day.  http://ss.exer.us/62   Copyright  VHI. All rights reserved.  Hip Abduction: Modified    Lying on right side with pillow between thighs, raise top leg from pillow, rotating slightly out. Repeat ___15_ times per set. Do __1__ sets per session. Do __1__ sessions per day.  http://orth.exer.[email or web address]     Copyright  VHI. All rights reserved.

## 2018-03-17 NOTE — Therapy (Signed)
[place] MAIN [facility] SERVICES [address] [place], [place], [address] Phone: [phone]   Fax:  [phone]  Physical Therapy Treatment  Patient Details  Name: [name] Day MRN: [id number] Date of Birth: December 27, 1940 Referring Provider: Kathrynn Ducking MD   Encounter Date: 03/17/2018  PT End of Session - 03/17/18 1528    Visit Number  3    Number of Visits  16    Date for PT Re-Evaluation  05/06/18    Authorization Type  3/10 PN (7/11)    PT Start Time  0317    PT Stop Time  0400    PT Time Calculation (min)  43 min    Equipment Utilized During Treatment  Gait belt    Activity Tolerance  Patient tolerated treatment well    Behavior During Therapy  [facility] for tasks assessed/performed       Past Medical History:  Diagnosis Date  . Anemia   . Bipolar 1 disorder ([place])   . Carotid artery occlusion   . Dementia   . Diabetes mellitus without complication ([place])   . Diabetic peripheral neuropathy ([place]) 03/03/2018  . Gait abnormality 02/12/2017  . Left-sided Bell's palsy 03/03/2018  . Memory disorder 02/12/2017  . TIA (transient ischemic attack)     Past Surgical History:  Procedure Laterality Date  . COLONOSCOPY WITH PROPOFOL N/A 02/25/2017   Procedure: COLONOSCOPY WITH PROPOFOL;  Surgeon: Manya Silvas, MD;  Location: [facility] ENDOSCOPY;  Service: Endoscopy;  Laterality: N/A;  . ESOPHAGOGASTRODUODENOSCOPY (EGD) WITH PROPOFOL N/A 02/25/2017   Procedure: ESOPHAGOGASTRODUODENOSCOPY (EGD) WITH PROPOFOL;  Surgeon: Manya Silvas, MD;  Location: [facility] ENDOSCOPY;  Service: Endoscopy;  Laterality: N/A;  . NASAL SINUS SURGERY    . PACEMAKER PLACEMENT    . TONSILLECTOMY    . TONSILLECTOMY      There were no vitals filed for this visit.  Subjective Assessment - 03/17/18 1526    Subjective  Patient reports that he went to vascular doctor and is back in the [employer]. Reports that it is feeling better in his feet now. He gets out of them  until monday morning.      Pertinent History  [name] Day is a 77 year old right-handed male with a history of bipolar disorder, anemia, carotid artery occlusion, dementia, DM, and TIA.  He additionally has diabetic neuropathy.  The patient had a stroke event 2 years ago, he had some alteration in memory initially and some difficulty with balance.  He underwent physical therapy 18 months ago which he believes was very helpful for him.  He has continued to have some gait instability that has gradually worsened over time, he is using a cane for ambulation.  He may fall on occasion, he has not fallen recently.  He believes that his memory issues have significantly improved since the stroke, he is now able to operate a motor vehicle without difficulty.    He has a defibrillator in place and cannot have MRI evaluation of the brain.  He did have a CT scan of the brain done on 04 February 2018 when he presented with a left-sided Bell's palsy.  He continues to have significant weakness on the left side of the face. Wants to improve stability in yard for gardening, Wants to get better at stair negotiation; getting up from tables to not knock over table, and getting up and down from lower chairs.     Limitations  Lifting;Standing;Walking;House hold activities;Other (comment)    How  long can you sit comfortably?  n/a    How long can you stand comfortably?  10 minutes; gets lean to R     How long can you walk comfortably?  10 minutes    Diagnostic tests  imaging    Patient Stated Goals  Wants to improve stability in yard for gardeining, Wants to get better at stair negotiation; getting up from tables to not knock over table,     Currently in Pain?  No/denies    Pain Score  0-No pain       Treatment: Prone hip extension x 10 ; patient has poor positioning in prone with leaning towards side and not able to stay in prone position  Standing hip abd with OTB x 10 x 2 BLE  Standing hip ext  with OTB x 10 x 2  BLE  Standing squats x 10 with 5 sec hold   Heel raises with poor quality due to weakness x 10 with UE support  Side stepping with OTB x 5 feet x 5 laps with UE support   Supine bridges x 10 x 2   Sidelying hip abd x 10 x 2 BLE with poor positioning and cues to get all the way onto his side   Patient is having great fatigue and difficulty with positioning but has no reports of pain.                         PT Education - 03/17/18 1527    Education Details  HEP    Person(s) Educated  Patient    Methods  Explanation    Comprehension  Verbalized understanding;Returned demonstration;Verbal cues required       PT Short Term Goals - 03/11/18 1714      PT SHORT TERM GOAL #1   Title  Patient will be independent in home exercise program to improve strength/mobility for better functional independence with ADLs.    Baseline  7/11: HEP given    Time  2    Period  Weeks    Status  New    Target Date  03/25/18      PT SHORT TERM GOAL #2   Title  Patient will perform 5 consecutive sit to stands in a row for improved mobility.     Baseline  7/11: able to perform 3    Time  2    Period  Weeks    Status  New    Target Date  03/25/18      PT SHORT TERM GOAL #3   Title   Patient will deny any falls over past 2 weeks to demonstrate improved safety awareness at home and work.     Baseline  2 falls    Time  2    Period  Weeks    Status  New    Target Date  03/25/18        PT Long Term Goals - 03/11/18 1715      PT LONG TERM GOAL #1   Title  Patient (> 69 years old) will complete five times sit to stand test in < 15 seconds indicating an increased LE strength and improved balance.    Baseline  7/11: unable to perform  more than 3     Time  8    Period  Weeks    Status  New    Target Date  05/06/18      PT LONG TERM GOAL #2  Title  Patient will increase 10 meter walk test to >1.27m/s as to improve gait speed for better community ambulation and to reduce fall  risk.    Baseline  7/11: .59 m/s with cane    Time  8    Period  Weeks    Status  New    Target Date  05/06/18      PT LONG TERM GOAL #3   Title  Patient will increase ABC scale score >50% to demonstrate better functional mobility and better confidence with ADLs.     Baseline  7/11: 8.5%    Time  8    Period  Weeks    Status  New    Target Date  05/06/18      PT LONG TERM GOAL #4   Title  Patient will increase Berg Balance score by > 6 points (42/56)  to demonstrate decreased fall risk during functional activities.    Baseline  7/11: 36/56    Time  8    Period  Weeks    Status  New    Target Date  05/06/18      PT LONG TERM GOAL #5   Title  Patient will increase BLE gross strength to 4+/5 as to improve functional strength for independent gait, increased standing tolerance and increased ADL ability    Baseline  7/11: 3+/5 gross    Time  8    Period  Weeks    Status  New    Target Date  05/06/18            Plan - 03/17/18 1545    Clinical Impression Statement  Patient instructed in intermediate LE strengthening in standing, supine, and seated as part of HEP. He required mod VCs to improve technique and to improve positioning for better strengthening. Patient required mod Vcs for learning how to tie the theraband and correct positioning of theraband. Patient performs open and closed chain exercises in standing and supine with fatigue after 10 reps and no reports  of pain.   He would benefit from additional skilled PT intervention to improve balance/gait safety and reduce fall risk.    Rehab Potential  Fair    Clinical Impairments Affecting Rehab Potential  (+) motivation, previous success of therapy (-) age, dementia    PT Frequency  2x / week    PT Duration  8 weeks    PT Treatment/Interventions  ADLs/Self Care Home Management;Aquatic Therapy;Cryotherapy;Ultrasound;Traction;Moist Heat;Electrical Stimulation;DME Instruction;Gait training;Stair training;Functional mobility  training;Neuromuscular re-education;Balance training;Therapeutic exercise;Therapeutic activities;Patient/family education;Orthotic Fit/Training;Manual techniques;Dry needling;Passive range of motion;Manual lymph drainage;Energy conservation;Splinting;Taping;Vestibular;Visual/perceptual remediation/compensation    PT Next Visit Plan  review HEP, add exercises, standing balance    PT Home Exercise Plan  see sheet    Consulted and Agree with Plan of Care  Patient       Patient will benefit from skilled therapeutic intervention in order to improve the following deficits and impairments:  Abnormal gait, Cardiopulmonary status limiting activity, Decreased activity tolerance, Decreased balance, Decreased endurance, Decreased coordination, Decreased cognition, Decreased mobility, Decreased safety awareness, Difficulty walking, Decreased strength, Increased edema, Impaired flexibility, Impaired perceived functional ability, Impaired sensation, Postural dysfunction, Improper body mechanics  Visit Diagnosis: Other abnormalities of gait and mobility  Muscle weakness (generalized)  Unsteadiness on feet     Problem List Patient Active Problem List   Diagnosis Date Noted  . Left-sided Bell's palsy 03/03/2018  . Diabetic peripheral neuropathy ([place]) 03/03/2018  . Abnormal LFTs 09/08/2017  . 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  Diabetes mellitus type 2, uncomplicated ([place]) 79/15/0569  . ED (erectile dysfunction) 09/08/2017  . Environmental allergies 09/08/2017  . Nephrolithiasis 09/08/2017  . Non-cardiac chest pain 09/08/2017  . Rectal bleeding 09/08/2017  . Sinusitis 09/08/2017  . Varicose veins of leg with swelling, bilateral 06/12/2017  . Venous ulcer ([place]) 05/18/2017  . Chronic venous insufficiency 05/18/2017  . Memory disorder 02/12/2017  . Gait abnormality 02/12/2017  . CKD (chronic kidney disease), stage III ([place]) 02/06/2017  . Hyperlipidemia 02/06/2017  . Swelling of limb 02/06/2017  .  Lymphedema 02/06/2017  . Heme positive stool 12/04/2016  . Mixed dementia 07/29/2016  . Cardiac pacemaker in situ 04/27/2016  . AV heart block 04/24/2016  . Hyperglycemia 04/24/2016  . Generalized weakness 04/24/2016  . Syncope and collapse 04/24/2016  . Metal bone fixation hardware in place 04/21/2016  . Bilateral carotid artery stenosis 02/05/2015  . OSA (obstructive sleep apnea) 02/05/2015  . Benign essential hypertension 01/19/2015  . B12 deficiency 05/23/2014  . Morbid obesity with alveolar hypoventilation ([place]) 05/23/2014  . Long-term insulin use ([place]) 05/22/2014  . Type II diabetes mellitus with manifestations, uncontrolled ([place]) 05/22/2014  . Aortic stenosis, moderate 11/14/2013  . Diabetes ([place]) 06/22/2007  . ANEMIA-NOS 03/29/2007  . ANXIETY 03/29/2007  . DEPRESSION 03/29/2007  . ALLERGIC RHINITIS 03/29/2007  . ASTHMA 03/29/2007  . OSTEOARTHRITIS 03/29/2007  . NEPHROLITHIASIS, HX OF 03/29/2007    Alanson Puls, [place] DPT 03/17/2018, 3:58 PM  [place] MAIN [facility] SERVICES [address] [place], [place], [address] Phone: [phone]   Fax:  [phone]  Name: [name] Day MRN: [id number] Date of Birth: 1940-09-04

## 2018-03-22 ENCOUNTER — Ambulatory Visit (INDEPENDENT_AMBULATORY_CARE_PROVIDER_SITE_OTHER): Payer: Medicare Other | Admitting: Vascular Surgery

## 2018-03-22 ENCOUNTER — Encounter (INDEPENDENT_AMBULATORY_CARE_PROVIDER_SITE_OTHER): Payer: Self-pay

## 2018-03-22 ENCOUNTER — Ambulatory Visit: Payer: Medicare Other

## 2018-03-22 VITALS — BP 133/62 | HR 104 | Resp 16 | Ht 70.0 in | Wt 270.0 lb

## 2018-03-22 DIAGNOSIS — I83892 Varicose veins of left lower extremities with other complications: Secondary | ICD-10-CM

## 2018-03-22 DIAGNOSIS — I83891 Varicose veins of right lower extremities with other complications: Secondary | ICD-10-CM | POA: Diagnosis not present

## 2018-03-22 DIAGNOSIS — I83893 Varicose veins of bilateral lower extremities with other complications: Secondary | ICD-10-CM

## 2018-03-22 NOTE — Progress Notes (Signed)
History of Present Illness  There is no documented history at this time  Assessments & Plan   There are no diagnoses linked to this encounter.    Additional instructions  Subjective:  Patient presents with venous ulcer of the Bilateral lower extremity.    Procedure:  3 layer unna wrap was placed Bilateral lower extremity.   Plan:   Follow up in one week.  

## 2018-03-23 ENCOUNTER — Encounter (INDEPENDENT_AMBULATORY_CARE_PROVIDER_SITE_OTHER): Payer: Self-pay | Admitting: Vascular Surgery

## 2018-03-24 ENCOUNTER — Ambulatory Visit: Payer: Medicare Other

## 2018-03-29 ENCOUNTER — Ambulatory Visit (INDEPENDENT_AMBULATORY_CARE_PROVIDER_SITE_OTHER): Payer: Medicare Other | Admitting: Vascular Surgery

## 2018-03-29 ENCOUNTER — Encounter (INDEPENDENT_AMBULATORY_CARE_PROVIDER_SITE_OTHER): Payer: Self-pay

## 2018-03-29 VITALS — BP 117/66 | HR 82 | Resp 16 | Ht 70.0 in | Wt 268.0 lb

## 2018-03-29 DIAGNOSIS — I83891 Varicose veins of right lower extremities with other complications: Secondary | ICD-10-CM

## 2018-03-29 DIAGNOSIS — I890 Lymphedema, not elsewhere classified: Secondary | ICD-10-CM | POA: Diagnosis not present

## 2018-03-29 DIAGNOSIS — I83893 Varicose veins of bilateral lower extremities with other complications: Secondary | ICD-10-CM

## 2018-03-29 DIAGNOSIS — I83892 Varicose veins of left lower extremities with other complications: Secondary | ICD-10-CM | POA: Diagnosis not present

## 2018-03-29 NOTE — Progress Notes (Signed)
History of Present Illness  There is no documented history at this time  Assessments & Plan   There are no diagnoses linked to this encounter.    Additional instructions  Subjective:  Patient presents with venous ulcer of the Bilateral lower extremity.    Procedure:  3 layer unna wrap was placed Bilateral lower extremity.   Plan:   Follow up in one week.  

## 2018-03-31 ENCOUNTER — Ambulatory Visit: Payer: Medicare Other

## 2018-04-01 DIAGNOSIS — I639 Cerebral infarction, unspecified: Secondary | ICD-10-CM

## 2018-04-01 HISTORY — DX: Cerebral infarction, unspecified: I63.9

## 2018-04-05 ENCOUNTER — Ambulatory Visit (INDEPENDENT_AMBULATORY_CARE_PROVIDER_SITE_OTHER): Payer: Medicare Other | Admitting: Nurse Practitioner

## 2018-04-05 ENCOUNTER — Encounter (INDEPENDENT_AMBULATORY_CARE_PROVIDER_SITE_OTHER): Payer: Self-pay

## 2018-04-05 ENCOUNTER — Ambulatory Visit: Payer: Medicare Other

## 2018-04-05 VITALS — BP 123/61 | HR 87 | Resp 15 | Ht 70.0 in | Wt 265.0 lb

## 2018-04-05 DIAGNOSIS — L97909 Non-pressure chronic ulcer of unspecified part of unspecified lower leg with unspecified severity: Secondary | ICD-10-CM

## 2018-04-05 DIAGNOSIS — I83009 Varicose veins of unspecified lower extremity with ulcer of unspecified site: Secondary | ICD-10-CM | POA: Diagnosis not present

## 2018-04-05 NOTE — Progress Notes (Signed)
History of Present Illness  There is no documented history at this time  Assessments & Plan   There are no diagnoses linked to this encounter.    Additional instructions  Subjective:  Patient presents with venous ulcer of the Bilateral lower extremity.    Procedure:  3 layer unna wrap was placed Bilateral lower extremity.   Plan:   Follow up in one week.  

## 2018-04-06 ENCOUNTER — Encounter (INDEPENDENT_AMBULATORY_CARE_PROVIDER_SITE_OTHER): Payer: Self-pay | Admitting: Nurse Practitioner

## 2018-04-07 ENCOUNTER — Ambulatory Visit: Payer: Medicare Other

## 2018-04-12 ENCOUNTER — Ambulatory Visit: Payer: Medicare Other

## 2018-04-12 ENCOUNTER — Encounter (INDEPENDENT_AMBULATORY_CARE_PROVIDER_SITE_OTHER): Payer: Medicare Other

## 2018-04-12 ENCOUNTER — Ambulatory Visit (INDEPENDENT_AMBULATORY_CARE_PROVIDER_SITE_OTHER): Payer: Medicare Other | Admitting: Vascular Surgery

## 2018-04-12 ENCOUNTER — Encounter (INDEPENDENT_AMBULATORY_CARE_PROVIDER_SITE_OTHER): Payer: Self-pay | Admitting: Vascular Surgery

## 2018-04-12 VITALS — BP 118/58 | HR 81 | Resp 16 | Ht 69.0 in | Wt 273.0 lb

## 2018-04-12 DIAGNOSIS — I872 Venous insufficiency (chronic) (peripheral): Secondary | ICD-10-CM

## 2018-04-12 DIAGNOSIS — I83893 Varicose veins of bilateral lower extremities with other complications: Secondary | ICD-10-CM

## 2018-04-12 NOTE — Progress Notes (Signed)
Subjective:    Patient ID: [name] Day, male    DOB: 1941/08/15, 77 y.o.   MRN: [id number] Chief Complaint  Patient presents with  . Follow-up   Patient presents today for a 5-week Unna wrap/bilateral lower extremity edema follow-up.  The patient presents today without complaint.  The patient has noted a market improvement in his bilateral lower extremity edema.  The patient does have a pair of medical grade 1 compression socks that he is able to transition into.  The patient denies any new ulcer formation to the bilateral lower extremity.  The patient denies any new or worsening pain to the bilateral lower extremity.  Patient denies any erythema to the bilateral lower extremity.  The patient denies any fever, nausea or vomiting.  Review of Systems  Constitutional: Negative.   HENT: Negative.   Eyes: Negative.   Respiratory: Negative.   Cardiovascular: Negative.   Gastrointestinal: Negative.   Endocrine: Negative.   Genitourinary: Negative.   Musculoskeletal: Negative.   Skin: Negative.   Allergic/Immunologic: Negative.   Neurological: Negative.   Hematological: Negative.   Psychiatric/Behavioral: Negative.       Objective:   Physical Exam  Constitutional: He is oriented to person, place, and time. He appears well-developed and well-nourished. No distress.  HENT:  Head: Normocephalic and atraumatic.  Right Ear: External ear normal.  Left Ear: External ear normal.  Eyes: Pupils are equal, round, and reactive to light. Conjunctivae and EOM are normal.  Neck: Normal range of motion.  Cardiovascular: Normal rate, regular rhythm and normal heart sounds.  Hard to palpate pedal pulses due to body habitus and edema however the bilateral feet are warm.  Skin is intact.  Pulmonary/Chest: Effort normal and breath sounds normal.  Musculoskeletal: Normal range of motion. He exhibits edema (Mild edema bilaterally).  Neurological: He is alert and oriented to person, place, and time.   Skin: Skin is warm and dry. He is not diaphoretic.  There is no active ulcerations or cellulitis noted to the bilateral lower extremity at this time  Psychiatric: He has a normal mood and affect. His behavior is normal. Judgment and thought content normal.  Vitals reviewed.  BP (!) 118/58   Pulse 81   Resp 16   Ht 5\' 9"  (1.753 m)   Wt 273 lb (123.8 kg)   BMI 40.32 kg/m   Past Medical History:  Diagnosis Date  . Anemia   . Bipolar 1 disorder ([place])   . Carotid artery occlusion   . Dementia   . Diabetes mellitus without complication ([place])   . Diabetic peripheral neuropathy ([place]) 03/03/2018  . Gait abnormality 02/12/2017  . Left-sided Bell's palsy 03/03/2018  . Memory disorder 02/12/2017  . TIA (transient ischemic attack)    Social History   Socioeconomic History  . Marital status: Widowed    Spouse name: Not on file  . Number of children: 2  . Years of education: 3  . Highest education level: Not on file  Occupational History  . Occupation: Retired  Scientific laboratory technician  . Financial resource strain: Not on file  . Food insecurity:    Worry: Not on file    Inability: Not on file  . Transportation needs:    Medical: Not on file    Non-medical: Not on file  Tobacco Use  . Smoking status: Former Research scientist (life sciences)  . Smokeless tobacco: Never Used  Substance and Sexual Activity  . Alcohol use: Yes    Comment: occasional  . Drug use:  No  . Sexual activity: Not on file  Lifestyle  . Physical activity:    Days per week: Not on file    Minutes per session: Not on file  . Stress: Not on file  Relationships  . Social connections:    Talks on phone: Not on file    Gets together: Not on file    Attends religious service: Not on file    Active member of club or organization: Not on file    Attends meetings of clubs or organizations: Not on file    Relationship status: Not on file  . Intimate partner violence:    Fear of current or ex partner: Not on file    Emotionally abused: Not on file      Physically abused: Not on file    Forced sexual activity: Not on file  Other Topics Concern  . Not on file  Social History Narrative   Lives with [name] Day   Caffeine use: caffeine free soda daily   Right handed   Past Surgical History:  Procedure Laterality Date  . COLONOSCOPY WITH PROPOFOL N/A 02/25/2017   Procedure: COLONOSCOPY WITH PROPOFOL;  Surgeon: Manya Silvas, MD;  Location[facility] ENDOSCOPY;  Service: Endoscopy;  Laterality: N/A;  . ESOPHAGOGASTRODUODENOSCOPY (EGD) WITH PROPOFOL N/A 02/25/2017   Procedure: ESOPHAGOGASTRODUODENOSCOPY (EGD) WITH PROPOFOL;  Surgeon: Manya Silvas, MD;  Location: [facility] ENDOSCOPY;  Service: Endoscopy;  Laterality: N/A;  . NASAL SINUS SURGERY    . PACEMAKER PLACEMENT    . TONSILLECTOMY    . TONSILLECTOMY     Family History  Family history unknown: Yes   Allergies  Allergen Reactions  . Clindamycin/Lincomycin     "turns me purple" "turns me orange"  . Lisinopril     REACTION: Cough  . Penicillins       Assessment & Plan:  Patient presents today for a 5-week Unna wrap/bilateral lower extremity edema follow-up.  The patient presents today without complaint.  The patient has noted a market improvement in his bilateral lower extremity edema.  The patient does have a pair of medical grade 1 compression socks that he is able to transition into.  The patient denies any new ulcer formation to the bilateral lower extremity.  The patient denies any new or worsening pain to the bilateral lower extremity.  Patient denies any erythema to the bilateral lower extremity.  The patient denies any fever, nausea or vomiting.  1. Chronic venous insufficiency - Stable Patient did very well over the last 5 weeks wearing his 3 layer zinc oxide Unna wraps to the bilateral lower extremity He presents today with a market improvement in edema and his ulcerations have healed The patient should transition back into his medical grade 1 compression socks,  elevate his legs multiple times a day and remain as active as possible on a daily basis. The patient expresses understanding Follow-up as needed The patient was instructed to call the office in the interim if any worsening edema or ulcerations to the legs, feet or toes occurs. The patient expresses their understanding.  2. Varicose veins of leg with swelling, bilateral - Stable Encouraged good control as its slows the progression of atherosclerotic disease  Current Outpatient Medications on File Prior to Visit  Medication Sig Dispense Refill  . allopurinol (ZYLOPRIM) 300 MG tablet Take by mouth.    . ALPRAZolam (XANAX) 0.5 MG tablet Take 0.5 mg by mouth at bedtime as needed for anxiety.    . AMBULATORY NON FORMULARY  MEDICATION BiPAP machine @ 15/12 DX: OSA DX Code: 1 each 0  . amphetamine-dextroamphetamine (ADDERALL) 20 MG tablet Take 1 tablet by mouth daily.    Marland Kitchen aspirin 325 MG tablet Take 325 mg by mouth daily.    . clotrimazole-betamethasone (LOTRISONE) cream Apply topically 2 (two) times daily. Apply to effected area 30 g 3  . desonide (DESOWEN) 0.05 % cream Apply topically.    Marland Kitchen FREESTYLE LITE test strip     . gabapentin (NEURONTIN) 100 MG capsule Take by mouth.    Marland Kitchen ibuprofen (ADVIL,MOTRIN) 200 MG tablet Take 400 mg by mouth every 6 (six) hours as needed.    . insulin regular human CONCENTRATED (HUMULIN R) 500 UNIT/ML injection Inject 25 Units into the skin.    . LamoTRIgine (LAMICTAL PO) Take 50 mg by mouth 2 (two) times daily.    Marland Kitchen losartan (COZAAR) 50 MG tablet Take 1 tablet by mouth daily.    . Lurasidone HCl (LATUDA) 60 MG TABS Take by mouth.    . metFORMIN (GLUCOPHAGE-XR) 500 MG 24 hr tablet Take 2 tablets by mouth 2 (two) times daily.    . mupirocin ointment (BACTROBAN) 2 %     . omeprazole (PRILOSEC) 40 MG capsule     . pravastatin (PRAVACHOL) 20 MG tablet Take 1 tablet by mouth daily.    Marland Kitchen liraglutide (VICTOZA) 18 MG/3ML SOPN Inject into the skin.    . tamsulosin  (FLOMAX) 0.4 MG CAPS capsule     . TANZEUM 50 MG PEN     . valACYclovir (VALTREX) 1000 MG tablet Take 1 tablet (1,000 mg total) by mouth 3 (three) times daily. (Patient not taking: Reported on 04/12/2018) 7 tablet 0   No current facility-administered medications on file prior to visit.    There are no Patient Instructions on file for this visit. No follow-ups on file.  Amram Maya A Julissa Browning, PA-C

## 2018-04-14 ENCOUNTER — Ambulatory Visit: Payer: Medicare Other

## 2018-04-15 ENCOUNTER — Ambulatory Visit (INDEPENDENT_AMBULATORY_CARE_PROVIDER_SITE_OTHER): Payer: Medicare Other | Admitting: Vascular Surgery

## 2018-04-19 ENCOUNTER — Ambulatory Visit: Payer: Medicare Other

## 2018-04-21 ENCOUNTER — Ambulatory Visit: Payer: Medicare Other

## 2018-04-24 ENCOUNTER — Encounter: Payer: Self-pay | Admitting: Emergency Medicine

## 2018-04-24 ENCOUNTER — Emergency Department: Payer: Medicare Other

## 2018-04-24 ENCOUNTER — Inpatient Hospital Stay
Admission: EM | Admit: 2018-04-24 | Discharge: 2018-04-27 | DRG: 066 | Disposition: A | Discharge status: Skilled Nursing Facility | Payer: Medicare Other | Attending: Internal Medicine | Admitting: Internal Medicine

## 2018-04-24 ENCOUNTER — Other Ambulatory Visit: Payer: Self-pay

## 2018-04-24 DIAGNOSIS — W1830XA Fall on same level, unspecified, initial encounter: Secondary | ICD-10-CM | POA: Diagnosis present

## 2018-04-24 DIAGNOSIS — N138 Other obstructive and reflux uropathy: Secondary | ICD-10-CM

## 2018-04-24 DIAGNOSIS — I6381 Other cerebral infarction due to occlusion or stenosis of small artery: Secondary | ICD-10-CM | POA: Diagnosis not present

## 2018-04-24 DIAGNOSIS — R4701 Aphasia: Secondary | ICD-10-CM | POA: Diagnosis present

## 2018-04-24 DIAGNOSIS — R296 Repeated falls: Secondary | ICD-10-CM | POA: Diagnosis present

## 2018-04-24 DIAGNOSIS — F319 Bipolar disorder, unspecified: Secondary | ICD-10-CM | POA: Diagnosis present

## 2018-04-24 DIAGNOSIS — Z8673 Personal history of transient ischemic attack (TIA), and cerebral infarction without residual deficits: Secondary | ICD-10-CM

## 2018-04-24 DIAGNOSIS — K219 Gastro-esophageal reflux disease without esophagitis: Secondary | ICD-10-CM | POA: Diagnosis present

## 2018-04-24 DIAGNOSIS — M109 Gout, unspecified: Secondary | ICD-10-CM | POA: Diagnosis present

## 2018-04-24 DIAGNOSIS — E1122 Type 2 diabetes mellitus with diabetic chronic kidney disease: Secondary | ICD-10-CM

## 2018-04-24 DIAGNOSIS — N401 Enlarged prostate with lower urinary tract symptoms: Secondary | ICD-10-CM

## 2018-04-24 DIAGNOSIS — W19XXXA Unspecified fall, initial encounter: Secondary | ICD-10-CM

## 2018-04-24 DIAGNOSIS — Z66 Do not resuscitate: Secondary | ICD-10-CM | POA: Diagnosis present

## 2018-04-24 DIAGNOSIS — Z950 Presence of cardiac pacemaker: Secondary | ICD-10-CM

## 2018-04-24 DIAGNOSIS — I10 Essential (primary) hypertension: Secondary | ICD-10-CM | POA: Diagnosis present

## 2018-04-24 DIAGNOSIS — N400 Enlarged prostate without lower urinary tract symptoms: Secondary | ICD-10-CM | POA: Diagnosis present

## 2018-04-24 DIAGNOSIS — F0390 Unspecified dementia without behavioral disturbance: Secondary | ICD-10-CM | POA: Diagnosis present

## 2018-04-24 DIAGNOSIS — E785 Hyperlipidemia, unspecified: Secondary | ICD-10-CM | POA: Diagnosis present

## 2018-04-24 DIAGNOSIS — G510 Bell's palsy: Secondary | ICD-10-CM | POA: Diagnosis present

## 2018-04-24 DIAGNOSIS — I639 Cerebral infarction, unspecified: Secondary | ICD-10-CM

## 2018-04-24 DIAGNOSIS — F419 Anxiety disorder, unspecified: Secondary | ICD-10-CM | POA: Diagnosis present

## 2018-04-24 DIAGNOSIS — E119 Type 2 diabetes mellitus without complications: Secondary | ICD-10-CM

## 2018-04-24 DIAGNOSIS — E1165 Type 2 diabetes mellitus with hyperglycemia: Secondary | ICD-10-CM | POA: Diagnosis present

## 2018-04-24 DIAGNOSIS — E1142 Type 2 diabetes mellitus with diabetic polyneuropathy: Secondary | ICD-10-CM | POA: Diagnosis present

## 2018-04-24 LAB — COMPREHENSIVE METABOLIC PANEL
ALT: 16 U/L (ref 0–44)
AST: 55 U/L — ABNORMAL HIGH (ref 15–41)
Albumin: 4 g/dL (ref 3.5–5.0)
Alkaline Phosphatase: 73 U/L (ref 38–126)
Anion gap: 8 (ref 5–15)
BILIRUBIN TOTAL: 3.3 mg/dL — AB (ref 0.3–1.2)
BUN: 11 mg/dL (ref 8–23)
CALCIUM: 9.1 mg/dL (ref 8.9–10.3)
CO2: 27 mmol/L (ref 22–32)
CREATININE: 0.58 mg/dL — AB (ref 0.61–1.24)
Chloride: 104 mmol/L (ref 98–111)
Glucose, Bld: 66 mg/dL — ABNORMAL LOW (ref 70–99)
Potassium: 3.9 mmol/L (ref 3.5–5.1)
Sodium: 139 mmol/L (ref 135–145)
TOTAL PROTEIN: 6.8 g/dL (ref 6.5–8.1)

## 2018-04-24 LAB — URINALYSIS, COMPLETE (UACMP) WITH MICROSCOPIC
BILIRUBIN URINE: NEGATIVE
Bacteria, UA: NONE SEEN
GLUCOSE, UA: NEGATIVE mg/dL
HGB URINE DIPSTICK: NEGATIVE
Ketones, ur: NEGATIVE mg/dL
NITRITE: NEGATIVE
Protein, ur: NEGATIVE mg/dL
Specific Gravity, Urine: 1.004 — ABNORMAL LOW (ref 1.005–1.030)
pH: 6 (ref 5.0–8.0)

## 2018-04-24 LAB — CBC
HCT: 34.6 % — ABNORMAL LOW (ref 40.0–52.0)
HEMOGLOBIN: 11.6 g/dL — AB (ref 13.0–18.0)
MCH: 23.1 pg — ABNORMAL LOW (ref 26.0–34.0)
MCHC: 33.5 g/dL (ref 32.0–36.0)
MCV: 68.8 fL — ABNORMAL LOW (ref 80.0–100.0)
PLATELETS: 105 10*3/uL — AB (ref 150–440)
RBC: 5.02 MIL/uL (ref 4.40–5.90)
RDW: 16.4 % — ABNORMAL HIGH (ref 11.5–14.5)
WBC: 5.3 10*3/uL (ref 3.8–10.6)

## 2018-04-24 LAB — TROPONIN I: TROPONIN I: 0.03 ng/mL — AB (ref <0.03)

## 2018-04-24 LAB — GLUCOSE, CAPILLARY
GLUCOSE-CAPILLARY: 47 mg/dL — AB (ref 70–99)
GLUCOSE-CAPILLARY: 50 mg/dL — AB (ref 70–99)
Glucose-Capillary: 116 mg/dL — ABNORMAL HIGH (ref 70–99)

## 2018-04-24 MED ORDER — GLUCOSE 40 % PO GEL
ORAL | Status: AC
  Administered 2018-04-24: 20:00:00
  Filled 2018-04-24: qty 1

## 2018-04-24 NOTE — ED Triage Notes (Addendum)
States has fallen 4 times in the past week. States has some pain in his right hip but not sure if it the hip that makes him fall or general weakness. History of CVAs. Friend with patient states that he has had some trouble articulating and forgetful last 2 days, however patient is clear historian at this time. L facial droop noted however he states this is due to history of bells palsy. Grips and leg strength equal.

## 2018-04-24 NOTE — ED Notes (Signed)
Patient requesting fsbs check. Patient fsbs 47. MD notified. Order received for oral glucose and juice.

## 2018-04-24 NOTE — ED Provider Notes (Signed)
[facility] Emergency Department Provider Note  Time seen: 7:30 PM  I have reviewed the triage vital signs and the nursing notes.   HISTORY  Chief Complaint Fall    HPI [name] Day is a 77 y.o. male with a past medical history of anemia, bipolar, diabetes, dementia, CVA, presents to the emergency department due to several falls.  According to family and the patient he does fall fairly frequently although states it is been probably 3 weeks since his last fall until yesterday had 2 falls yesterday and one fall today.  States a feeling of generalized fatigue/weakness.  Also states for the past 1 week he has been experiencing pain in the right hip with ambulation.  Has been using a walker for the past 1 week and believes he is falling because of this.  Patient is able to bear weight on it denies any pain currently in the right hip.  Denies any headache, any weakness numbness of any arm or leg confusion or slurred speech.  Daughter states over the past 2 days he has had increased trouble with his memory as well as word finding at times.  Largely negative review of systems.   Past Medical History:  Diagnosis Date  . Anemia   . Bipolar 1 disorder ([place])   . Carotid artery occlusion   . Dementia   . Diabetes mellitus without complication ([place])   . Diabetic peripheral neuropathy ([place]) 03/03/2018  . Gait abnormality 02/12/2017  . Left-sided Bell's palsy 03/03/2018  . Memory disorder 02/12/2017  . TIA (transient ischemic attack)     Patient Active Problem List   Diagnosis Date Noted  . Left-sided Bell's palsy 03/03/2018  . Diabetic peripheral neuropathy ([place]) 03/03/2018  . Abnormal LFTs 09/08/2017  . 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 Diabetes mellitus type 2, uncomplicated ([place]) 24/05/7352  . ED (erectile dysfunction) 09/08/2017  . Environmental allergies 09/08/2017  . Nephrolithiasis 09/08/2017  . Non-cardiac chest pain 09/08/2017  . Rectal bleeding 09/08/2017   . Sinusitis 09/08/2017  . Varicose veins of leg with swelling, bilateral 06/12/2017  . Venous ulcer ([place]) 05/18/2017  . Chronic venous insufficiency 05/18/2017  . Memory disorder 02/12/2017  . Gait abnormality 02/12/2017  . CKD (chronic kidney disease), stage III ([place]) 02/06/2017  . Hyperlipidemia 02/06/2017  . Swelling of limb 02/06/2017  . Lymphedema 02/06/2017  . Heme positive stool 12/04/2016  . Mixed dementia 07/29/2016  . Cardiac pacemaker in situ 04/27/2016  . AV heart block 04/24/2016  . Hyperglycemia 04/24/2016  . Generalized weakness 04/24/2016  . Syncope and collapse 04/24/2016  . Metal bone fixation hardware in place 04/21/2016  . Bilateral carotid artery stenosis 02/05/2015  . OSA (obstructive sleep apnea) 02/05/2015  . Benign essential hypertension 01/19/2015  . B12 deficiency 05/23/2014  . 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 Long-term insulin use ([place]) 05/22/2014  . Type II diabetes mellitus with manifestations, uncontrolled ([place]) 05/22/2014  . Aortic stenosis, moderate 11/14/2013  . Diabetes ([place]) 06/22/2007  . ANEMIA-NOS 03/29/2007  . ANXIETY 03/29/2007  . DEPRESSION 03/29/2007  . ALLERGIC RHINITIS 03/29/2007  . ASTHMA 03/29/2007  . OSTEOARTHRITIS 03/29/2007  . NEPHROLITHIASIS, HX OF 03/29/2007    Past Surgical History:  Procedure Laterality Date  . COLONOSCOPY WITH PROPOFOL N/A 02/25/2017   Procedure: COLONOSCOPY WITH PROPOFOL;  Surgeon: Manya Silvas, MD;  Location[facility] ENDOSCOPY;  Service: Endoscopy;  Laterality: N/A;  . ESOPHAGOGASTRODUODENOSCOPY (EGD) WITH PROPOFOL N/A 02/25/2017   Procedure: ESOPHAGOGASTRODUODENOSCOPY (EGD) WITH PROPOFOL;  Surgeon: Manya Silvas, MD;  Location: [facility] ENDOSCOPY;  Service: Endoscopy;  Laterality: N/A;  . NASAL SINUS SURGERY    . PACEMAKER PLACEMENT    . TONSILLECTOMY    . TONSILLECTOMY      Prior to Admission medications   Medication Sig Start Date End Date Taking? Authorizing  Provider  allopurinol (ZYLOPRIM) 300 MG tablet Take by mouth. 10/30/17 10/30/18  [provider]  ALPRAZolam Duanne Moron) 0.5 MG tablet Take 0.5 mg by mouth at bedtime as needed for anxiety.    [provider]  AMBULATORY NON FORMULARY MEDICATION BiPAP machine @ 15/12 DX: OSA DX Code: 02/09/17   Wilhelmina Mcardle, MD  amphetamine-dextroamphetamine (ADDERALL) 20 MG tablet Take 1 tablet by mouth daily. 04/21/16   [provider]  aspirin 325 MG tablet Take 325 mg by mouth daily.    [provider]  clotrimazole-betamethasone (LOTRISONE) cream Apply topically 2 (two) times daily. Apply to effected area 10/02/17   Nickie Retort, MD  desonide (DESOWEN) 0.05 % cream Apply topically. 10/19/17 10/19/18  [provider]  FREESTYLE LITE test strip  11/03/16   [provider]  gabapentin (NEURONTIN) 100 MG capsule Take by mouth. 03/08/18 03/08/19  [provider]  ibuprofen (ADVIL,MOTRIN) 200 MG tablet Take 400 mg by mouth every 6 (six) hours as needed.    [provider]  insulin regular human CONCENTRATED (HUMULIN R) 500 UNIT/ML injection Inject 25 Units into the skin. 11/30/15   [provider]  LamoTRIgine (LAMICTAL PO) Take 50 mg by mouth 2 (two) times daily.    [provider]  liraglutide (VICTOZA) 18 MG/3ML SOPN Inject into the skin. 01/22/17 02/21/17  [provider]  losartan (COZAAR) 50 MG tablet Take 1 tablet by mouth daily. 04/17/16   [provider]  Lurasidone HCl (LATUDA) 60 MG TABS Take by mouth. 02/03/18   [provider]  metFORMIN (GLUCOPHAGE-XR) 500 MG 24 hr tablet Take 2 tablets by mouth 2 (two) times daily. 04/17/16   [provider]  mupirocin ointment (BACTROBAN) 2 %  03/31/17   [provider]  omeprazole (PRILOSEC) 40 MG capsule  02/27/17   [provider]  pravastatin (PRAVACHOL) 20 MG tablet Take 1 tablet by mouth daily. 04/07/16   [provider]   tamsulosin (FLOMAX) 0.4 MG CAPS capsule  11/01/16   [provider]  TANZEUM 50 MG PEN  09/07/16   [provider]  valACYclovir (VALTREX) 1000 MG tablet Take 1 tablet (1,000 mg total) by mouth 3 (three) times daily. Patient not taking: Reported on 04/12/2018 02/05/18   Gregor Hams, MD    Allergies  Allergen Reactions  . Clindamycin/Lincomycin     "turns me purple" "turns me orange"  . Lisinopril     REACTION: Cough  . Penicillins     Family History  Family history unknown: Yes    Social History Social History   Tobacco Use  . Smoking status: Former Research scientist (life sciences)  . Smokeless tobacco: Never Used  Substance Use Topics  . Alcohol use: Yes    Comment: occasional  . Drug use: No    Review of Systems Constitutional: Negative for loss of consciousness.  Negative for fever. Cardiovascular: Negative for chest pain. Respiratory: Negative for shortness of breath. Gastrointestinal: Negative for abdominal pain, vomiting and diarrhea. Genitourinary: Negative for urinary compaints Musculoskeletal: Negative for musculoskeletal complaints Skin: Negative for skin complaints  Neurological: Negative for headache All other ROS negative  ____________________________________________   PHYSICAL  EXAM:  VITAL SIGNS: ED Triage Vitals  Enc Vitals Group     BP 04/24/18 1740 (!) 149/54     Pulse Rate 04/24/18 1740 (!) 56     Resp 04/24/18 1740 20     Temp 04/24/18 1740 98.9 F (37.2 C)     Temp Source 04/24/18 1740 Oral     SpO2 04/24/18 1740 98 %     Weight 04/24/18 1743 240 lb (108.9 kg)     Height 04/24/18 1743 5\' 9"  (1.753 m)     Head Circumference --      Peak Flow --      Pain Score 04/24/18 1743 9     Pain Loc --      Pain Edu? --      Excl. in [place]? --    Constitutional: Alert and oriented. Well appearing and in no distress. Eyes: Normal exam ENT   Head: Normocephalic and atraumatic.   Mouth/Throat: Mucous membranes are moist. Cardiovascular: Normal  rate, regular rhythm. Respiratory: Normal respiratory effort without tachypnea nor retractions. Breath sounds are clear  Gastrointestinal: Soft and nontender. No distention. Musculoskeletal: Nontender with normal range of motion in all extremities.  Neurologic:  Normal speech and language. No gross focal neurologic deficits.  5/5 motor in extremities.  No pronator drift.  Patient does have a left facial droop which is chronic per family.  Does appear to have mild left lower extremity drift although it is difficult for him to keep either extremity off the bed for prolonged time. Skin: She does have a small abrasion/skin tear to left upper extremity. Psychiatric: Mood and affect are normal.   ____________________________________________    EKG  EKG reviewed and interpreted by myself shows a dual paced rhythm at 65 bpm with a widened QRS nonspecific ST changes.  ____________________________________________    RADIOLOGY  CT scan shows small hypodensity in the right periventricular matter possible acute or subacute infarct.  ____________________________________________   INITIAL IMPRESSION / ASSESSMENT AND PLAN / ED COURSE  Pertinent labs & imaging results that were available during my care of the patient were reviewed by me and considered in my medical decision making (see chart for details).  Patient presents to the emergency department for 3 falls over the past 2 days and a feeling of generalized fatigue/weakness.  Overall the patient appears well, normal physical examination normal neurological examination besides a left facial droop although the family states this is chronic x3 months, and mild left lower extremity weakness although the patient states it is because of back/hip discomfort.  Patient has good range of motion in the right hip without tenderness elicited.  Patient's labs are largely at baseline, troponin 0.03.  Will obtain CT imaging of the head and urinalysis to continue  our evaluation.  Differential would include muscular skeletal pain leading to increased falls, infectious etiology, ICH or CVA.  No gross deficit on examination besides left facial droop which is chronic.  CT does show right periventricular matter acute versus subacute infarct.  Given 3 falls in 2 days with a possible acute infarct on CT I do believe the patient warrants admission to the hospital for further work-up.  Patient is agreeable to this plan of care.  NIH Stroke Scale   Interval: Baseline Time: 9:45 PM Person Administering Scale: Harvest Dark  Administer stroke scale items in the order listed. Record performance in each category after each subscale exam. Do not go back and change scores. Follow directions provided for each exam technique.  Scores should reflect what the patient does, not what the clinician thinks the patient can do. The clinician should record answers while administering the exam and work quickly. Except where indicated, the patient should not be coached (i.e., repeated requests to patient to make a special effort).   1a  Level of consciousness: 0=alert; keenly responsive  1b. LOC questions:  0=Performs both tasks correctly  1c. LOC commands: 0=Performs both tasks correctly  2.  Best Gaze: 0=normal  3.  Visual: 0=No visual loss  4. Facial Palsy: 2=Partial paralysis (total or near total paralysis of the lower face)  5a.  Motor left arm: 0=No drift, limb holds 90 (or 45) degrees for full 10 seconds  5b.  Motor right arm: 0=No drift, limb holds 90 (or 45) degrees for full 10 seconds  6a. motor left leg: 1=Drift, limb holds 90 (or 45) degrees but drifts down before full 10 seconds: does not hit bed  6b  Motor right leg:  0=No drift, limb holds 90 (or 45) degrees for full 10 seconds  7. Limb Ataxia: 0=Absent  8.  Sensory: 0=Normal; no sensory loss  9. Best Language:  0=No aphasia, normal  10. Dysarthria: 0=Normal  11. Extinction and Inattention: 0=No abnormality   12. Distal motor function: 0=Normal   Total:   3 (w/old left facial droop)    ____________________________________________   FINAL CLINICAL IMPRESSION(S) / ED DIAGNOSES  Falls CVA    Harvest Dark, MD 04/24/18 2147

## 2018-04-24 NOTE — ED Notes (Addendum)
First nurse note:  Patient reports multiple falls in the past two days with some new confusion and weakness.  Patient has left sided facial droop that he states is not new, but is due to bells palsy.  Patient does have history of a stroke.  Patient states he often has difficulty finding his words.  Patient's daughter states today, symptoms are new, patient normally does not have nearly as much difficulty walking or speaking and cannot explain how he fell.

## 2018-04-24 NOTE — H&P (Signed)
[facility] NAME: [name] Day    MR#:  [id number]  DATE OF BIRTH:  07/21/1941  DATE OF ADMISSION:  04/24/2018  PRIMARY CARE PHYSICIAN: Idelle Crouch, MD   REQUESTING/REFERRING PHYSICIAN: Kerman Passey, MD  CHIEF COMPLAINT:   Chief Complaint  Patient presents with  . Fall    HISTORY OF PRESENT ILLNESS:  [name] Day  is a 77 y.o. male who presents with chief complaint as above.  Patient has been having increased frequency of falls currently.  He went for a few weeks without a fall, but over the last 2 to 3 days has had 3 falls.  He was complaining of right hip pain, but tonight states that he thinks it may be that he was compensating and overusing that hip.  Here in the ED he was found to have an abnormal CT head.  There is some concern for stroke.  He is unable to get an MRI due to pacemaker and metal plate.  Hospitalist were called for admission  PAST MEDICAL HISTORY:   Past Medical History:  Diagnosis Date  . Anemia   . Bipolar 1 disorder ([place])   . Carotid artery occlusion   . Dementia   . Diabetes mellitus without complication ([place])   . Diabetic peripheral neuropathy ([place]) 03/03/2018  . Gait abnormality 02/12/2017  . Left-sided Bell's palsy 03/03/2018  . Memory disorder 02/12/2017  . TIA (transient ischemic attack)      PAST SURGICAL HISTORY:   Past Surgical History:  Procedure Laterality Date  . COLONOSCOPY WITH PROPOFOL N/A 02/25/2017   Procedure: COLONOSCOPY WITH PROPOFOL;  Surgeon: Manya Silvas, MD;  Location: [facility] ENDOSCOPY;  Service: Endoscopy;  Laterality: N/A;  . ESOPHAGOGASTRODUODENOSCOPY (EGD) WITH PROPOFOL N/A 02/25/2017   Procedure: ESOPHAGOGASTRODUODENOSCOPY (EGD) WITH PROPOFOL;  Surgeon: Manya Silvas, MD;  Location: [facility] ENDOSCOPY;  Service: Endoscopy;  Laterality: N/A;  . NASAL SINUS SURGERY    . PACEMAKER PLACEMENT    . TONSILLECTOMY    . TONSILLECTOMY       SOCIAL  HISTORY:   Social History   Tobacco Use  . Smoking status: Former Research scientist (life sciences)  . Smokeless tobacco: Never Used  Substance Use Topics  . Alcohol use: Yes    Comment: occasional     FAMILY HISTORY:   Family History  Problem Relation Age of Onset  . Allergies Mother   . Diabetes Father   . Diabetes Sister      DRUG ALLERGIES:   Allergies  Allergen Reactions  . Clindamycin/Lincomycin     "turns me purple" "turns me orange"  . Lisinopril     REACTION: Cough  . Penicillins     MEDICATIONS AT HOME:   Prior to Admission medications   Medication Sig Start Date End Date Taking? Authorizing Provider  allopurinol (ZYLOPRIM) 300 MG tablet Take by mouth. 10/30/17 10/30/18  [provider]  ALPRAZolam Duanne Moron) 0.5 MG tablet Take 0.5 mg by mouth at bedtime as needed for anxiety.    [provider]  AMBULATORY NON FORMULARY MEDICATION BiPAP machine @ 15/12 DX: OSA DX Code: 02/09/17   Wilhelmina Mcardle, MD  amphetamine-dextroamphetamine (ADDERALL) 20 MG tablet Take 1 tablet by mouth daily. 04/21/16   [provider]  aspirin 325 MG tablet Take 325 mg by mouth daily.    [provider]  clotrimazole-betamethasone (LOTRISONE) cream Apply topically 2 (two) times daily. Apply to effected area 10/02/17   Nickie Retort, MD  desonide (DESOWEN) 0.05 % cream Apply topically. 10/19/17 10/19/18  [provider]  FREESTYLE LITE test strip  11/03/16   [provider]  gabapentin (NEURONTIN) 100 MG capsule Take by mouth. 03/08/18 03/08/19  [provider]  ibuprofen (ADVIL,MOTRIN) 200 MG tablet Take 400 mg by mouth every 6 (six) hours as needed.    [provider]  insulin regular human CONCENTRATED (HUMULIN R) 500 UNIT/ML injection Inject 25 Units into the skin. 11/30/15   [provider]  LamoTRIgine (LAMICTAL PO) Take 50 mg by mouth 2 (two) times daily.    [provider]  liraglutide (VICTOZA) 18 MG/3ML SOPN Inject into  the skin. 01/22/17 02/21/17  [provider]  losartan (COZAAR) 50 MG tablet Take 1 tablet by mouth daily. 04/17/16   [provider]  Lurasidone HCl (LATUDA) 60 MG TABS Take by mouth. 02/03/18   [provider]  metFORMIN (GLUCOPHAGE-XR) 500 MG 24 hr tablet Take 2 tablets by mouth 2 (two) times daily. 04/17/16   [provider]  mupirocin ointment (BACTROBAN) 2 %  03/31/17   [provider]  omeprazole (PRILOSEC) 40 MG capsule  02/27/17   [provider]  pravastatin (PRAVACHOL) 20 MG tablet Take 1 tablet by mouth daily. 04/07/16   [provider]  tamsulosin (FLOMAX) 0.4 MG CAPS capsule  11/01/16   [provider]  TANZEUM 50 MG PEN  09/07/16   [provider]  valACYclovir (VALTREX) 1000 MG tablet Take 1 tablet (1,000 mg total) by mouth 3 (three) times daily. Patient not taking: Reported on 04/12/2018 02/05/18   Gregor Hams, MD    REVIEW OF SYSTEMS:  Review of Systems  Constitutional: Negative for chills, fever, malaise/fatigue and weight loss.  HENT: Negative for ear pain, hearing loss and tinnitus.   Eyes: Negative for blurred vision, double vision, pain and redness.  Respiratory: Negative for cough, hemoptysis and shortness of breath.   Cardiovascular: Negative for chest pain, palpitations, orthopnea and leg swelling.  Gastrointestinal: Negative for abdominal pain, constipation, diarrhea, nausea and vomiting.  Genitourinary: Negative for dysuria, frequency and hematuria.  Musculoskeletal: Positive for falls. Negative for back pain, joint pain and neck pain.  Skin:       No acne, rash, or lesions  Neurological: Positive for weakness. Negative for dizziness, tremors and focal weakness.  Endo/Heme/Allergies: Negative for polydipsia. Does not bruise/bleed easily.  Psychiatric/Behavioral: Negative for depression. The patient is not nervous/anxious and does not have insomnia.      VITAL SIGNS:   Vitals:   04/24/18  1740 04/24/18 1743  BP: (!) 149/54   Pulse: (!) 56   Resp: 20   Temp: 98.9 F (37.2 C)   TempSrc: Oral   SpO2: 98%   Weight:  108.9 kg  Height:  5\' 9"  (1.753 m)   Wt Readings from Last 3 Encounters:  04/24/18 108.9 kg  04/12/18 123.8 kg  04/05/18 120.2 kg    PHYSICAL EXAMINATION:  Physical Exam  Vitals reviewed. Constitutional: He is oriented to person, place, and time. He appears well-developed and well-nourished. No distress.  HENT:  Head: Normocephalic and atraumatic.  Mouth/Throat: Oropharynx is clear and moist.  Eyes: Pupils are equal, round, and reactive to light. Conjunctivae and EOM are normal. No scleral icterus.  Neck: Normal range of motion. Neck supple. No JVD present. No thyromegaly present.  Cardiovascular: Normal rate, regular rhythm and intact distal pulses. Exam reveals no gallop and no friction rub.  No murmur heard. Respiratory: Effort  normal and breath sounds normal. No respiratory distress. He has no wheezes. He has no rales.  GI: Soft. Bowel sounds are normal. He exhibits no distension. There is no tenderness.  Musculoskeletal: Normal range of motion. He exhibits no edema.  No arthritis, no gout  Lymphadenopathy:    He has no cervical adenopathy.  Neurological: He is alert and oriented to person, place, and time. No cranial nerve deficit.  Neurologic: Cranial nerves II-XII intact, Sensation intact to light touch/pinprick, 5/5 strength in all right extremities, 4/5 strength left upper extremity, 3/5 strength left lower extremity, no dysarthria, no aphasia, no dysphagia, memory intact, finger to nose testing showed no abnormality, no pronator drift  Skin: Skin is warm and dry. No rash noted. No erythema.  Psychiatric: He has a normal mood and affect. His behavior is normal. Judgment and thought content normal.    LABORATORY PANEL:   CBC Recent Labs  Lab 04/24/18 1756  WBC 5.3  HGB 11.6*  HCT 34.6*  PLT 105*    ------------------------------------------------------------------------------------------------------------------  Chemistries  Recent Labs  Lab 04/24/18 1756  NA 139  K 3.9  CL 104  CO2 27  GLUCOSE 66*  BUN 11  CREATININE 0.58*  CALCIUM 9.1  AST 55*  ALT 16  ALKPHOS 73  BILITOT 3.3*   ------------------------------------------------------------------------------------------------------------------  Cardiac Enzymes Recent Labs  Lab 04/24/18 1756  TROPONINI 0.03*   ------------------------------------------------------------------------------------------------------------------  RADIOLOGY:  Ct Head Wo Contrast  Result Date: 04/24/2018 CLINICAL DATA:  Delete head trauma EXAM: CT HEAD WITHOUT CONTRAST TECHNIQUE: Contiguous axial images were obtained from the base of the skull through the vertex without intravenous contrast. COMPARISON:  02/04/2018 head CT FINDINGS: Brain: No hemorrhage or intracranial mass is visualized. Atrophy and small vessel ischemic changes of the white matter. Possible acute to subacute lacunar infarct within the right periventricular white matter, new since 02/04/2018. Stable prominent ventricle size. Chronic lacunar infarcts within the bilateral basal ganglia. Vascular: No hyperdense vessels. Vertebral and carotid vascular calcification Skull: Normal. Negative for fracture or focal lesion. Sinuses/Orbits: Mucosal opacification of the right ethmoid sinuses. No acute orbital abnormality. Other: None IMPRESSION: 1. Small focal hypodensity within the right periventricular white matter, may reflect possible acute or subacute lacunar infarct. This is new as compared with 02/04/2018. 2. Atrophy with small vessel ischemic changes of the white matter. Electronically Signed   By: Donavan Foil M.D.   On: 04/24/2018 19:52    EKG:   Orders placed or performed during the hospital encounter of 04/24/18  . EKG 12-Lead  . EKG 12-Lead  . ED EKG  . ED EKG     IMPRESSION AND PLAN:  Principal Problem:   Expressive aphasia -patient and daughter states that he has had some intermittent episodes of expressive aphasia over the last 2 to 3 days.  Admit per stroke admission order set with appropriate consults and labs, defer to neurology for further imaging recommendation Active Problems:   Frequent falls -increased over the last few days, patient initially sided right hip pain, but on exam states that he has felt some left-sided weakness as well.  Work-up as above   Diabetes (HCC) -sliding scale insulin with corresponding glucose checks   Anxiety -home dose anxiolytic   Benign essential hypertension -home dose antihypertensive   Left-sided Bell's palsy -this is been chronic for the last 3 months and is a cause of the patient's left facial droop   Hyperlipidemia -Home dose antilipid  Chart review performed and case discussed with ED provider. Labs,  imaging and/or ECG reviewed by provider and discussed with patient/family. Management plans discussed with the patient and/or family.  DVT PROPHYLAXIS: SubQ lovenox   GI PROPHYLAXIS:  PPI   ADMISSION STATUS: Inpatient     CODE STATUS: Full Advance Directive Documentation     Most Recent Value  Type of Advance Directive  Out of facility DNR (pink MOST or yellow form)  Pre-existing out of facility DNR order (yellow form or pink MOST form)  -  "MOST" Form in Place?  -      TOTAL TIME TAKING CARE OF THIS PATIENT: 45 minutes.   Zaiyah Sottile Netarts 04/24/2018, 10:27 PM  [facility]  Office  [phone]  CC: Primary care physician; Idelle Crouch, MD  Note:  This document was prepared using Dragon voice recognition software and may include unintentional dictation errors.

## 2018-04-25 ENCOUNTER — Inpatient Hospital Stay: Payer: Medicare Other

## 2018-04-25 ENCOUNTER — Other Ambulatory Visit: Payer: Self-pay

## 2018-04-25 ENCOUNTER — Encounter: Payer: Self-pay | Admitting: Radiology

## 2018-04-25 DIAGNOSIS — R4701 Aphasia: Secondary | ICD-10-CM

## 2018-04-25 LAB — LIPID PANEL
Cholesterol: 152 mg/dL (ref 0–200)
HDL: 35 mg/dL — AB (ref >40)
LDL Cholesterol: 96 mg/dL (ref 0–99)
Total CHOL/HDL Ratio: 4.3 RATIO
Triglycerides: 103 mg/dL (ref <150)
VLDL: 21 mg/dL (ref 0–40)

## 2018-04-25 LAB — GLUCOSE, CAPILLARY
GLUCOSE-CAPILLARY: 106 mg/dL — AB (ref 70–99)
GLUCOSE-CAPILLARY: 167 mg/dL — AB (ref 70–99)
Glucose-Capillary: 118 mg/dL — ABNORMAL HIGH (ref 70–99)
Glucose-Capillary: 198 mg/dL — ABNORMAL HIGH (ref 70–99)
Glucose-Capillary: 221 mg/dL — ABNORMAL HIGH (ref 70–99)

## 2018-04-25 LAB — HEMOGLOBIN A1C
HEMOGLOBIN A1C: 9.1 % — AB (ref 4.8–5.6)
MEAN PLASMA GLUCOSE: 214.47 mg/dL

## 2018-04-25 MED ORDER — STROKE: EARLY STAGES OF RECOVERY BOOK
Freq: Once | Status: AC
  Administered 2018-04-25: 02:00:00

## 2018-04-25 MED ORDER — INSULIN ASPART 100 UNIT/ML ~~LOC~~ SOLN
0.0000 [IU] | Freq: Every day | SUBCUTANEOUS | Status: DC
  Administered 2018-04-25: 22:00:00 2 [IU] via SUBCUTANEOUS
  Filled 2018-04-25: qty 1

## 2018-04-25 MED ORDER — ALPRAZOLAM 0.5 MG PO TABS: 0.5000 mg | ORAL_TABLET | Freq: Every evening | ORAL | Status: DC | PRN

## 2018-04-25 MED ORDER — ACETAMINOPHEN 325 MG PO TABS
650.0000 mg | ORAL_TABLET | ORAL | Status: DC | PRN
  Administered 2018-04-25 (×2): 650 mg via ORAL
  Filled 2018-04-25 (×2): qty 2

## 2018-04-25 MED ORDER — AMPHETAMINE-DEXTROAMPHETAMINE 5 MG PO TABS
20.0000 mg | ORAL_TABLET | Freq: Every day | ORAL | Status: DC
  Administered 2018-04-25 – 2018-04-27 (×3): 20 mg via ORAL
  Filled 2018-04-25 (×3): qty 4

## 2018-04-25 MED ORDER — INSULIN REGULAR HUMAN (CONC) 500 UNIT/ML ~~LOC~~ SOPN
75.0000 [IU] | PEN_INJECTOR | Freq: Two times a day (BID) | SUBCUTANEOUS | Status: DC
  Filled 2018-04-25: qty 3

## 2018-04-25 MED ORDER — ACETAMINOPHEN 650 MG RE SUPP: 650.0000 mg | RECTAL | Status: DC | PRN

## 2018-04-25 MED ORDER — PANTOPRAZOLE SODIUM 40 MG PO TBEC
40.0000 mg | DELAYED_RELEASE_TABLET | Freq: Every day | ORAL | Status: DC
  Administered 2018-04-25 – 2018-04-27 (×3): 40 mg via ORAL
  Filled 2018-04-25 (×3): qty 1

## 2018-04-25 MED ORDER — LOSARTAN POTASSIUM 50 MG PO TABS
50.0000 mg | ORAL_TABLET | Freq: Every day | ORAL | Status: DC
  Administered 2018-04-25 – 2018-04-27 (×3): 50 mg via ORAL
  Filled 2018-04-25 (×3): qty 1

## 2018-04-25 MED ORDER — ENOXAPARIN SODIUM 40 MG/0.4ML ~~LOC~~ SOLN
40.0000 mg | SUBCUTANEOUS | Status: DC
  Administered 2018-04-25 – 2018-04-26 (×2): 40 mg via SUBCUTANEOUS
  Filled 2018-04-25 (×2): qty 0.4

## 2018-04-25 MED ORDER — GABAPENTIN 100 MG PO CAPS
200.0000 mg | ORAL_CAPSULE | Freq: Three times a day (TID) | ORAL | Status: DC
  Administered 2018-04-25 – 2018-04-27 (×7): 200 mg via ORAL
  Filled 2018-04-25 (×7): qty 2

## 2018-04-25 MED ORDER — INSULIN REGULAR HUMAN (CONC) 500 UNIT/ML ~~LOC~~ SOPN
50.0000 [IU] | PEN_INJECTOR | Freq: Once | SUBCUTANEOUS | Status: AC
  Administered 2018-04-25: 17:00:00 50 [IU] via SUBCUTANEOUS
  Filled 2018-04-25: qty 3

## 2018-04-25 MED ORDER — ACETAMINOPHEN 160 MG/5ML PO SOLN: 650.0000 mg | ORAL | Status: DC | PRN

## 2018-04-25 MED ORDER — ASPIRIN EC 325 MG PO TBEC
325.0000 mg | DELAYED_RELEASE_TABLET | Freq: Every day | ORAL | Status: DC
  Administered 2018-04-25 – 2018-04-27 (×3): 325 mg via ORAL
  Filled 2018-04-25 (×3): qty 1

## 2018-04-25 MED ORDER — INSULIN REGULAR HUMAN (CONC) 500 UNIT/ML ~~LOC~~ SOLN: 25.0000 [IU] | Freq: Two times a day (BID) | SUBCUTANEOUS | Status: DC

## 2018-04-25 MED ORDER — PRAVASTATIN SODIUM 20 MG PO TABS
20.0000 mg | ORAL_TABLET | Freq: Every day | ORAL | Status: DC
  Administered 2018-04-25: 11:00:00 20 mg via ORAL
  Filled 2018-04-25: qty 1

## 2018-04-25 MED ORDER — TAMSULOSIN HCL 0.4 MG PO CAPS
0.4000 mg | ORAL_CAPSULE | Freq: Every day | ORAL | Status: DC
  Administered 2018-04-25 – 2018-04-27 (×3): 0.4 mg via ORAL
  Filled 2018-04-25 (×3): qty 1

## 2018-04-25 MED ORDER — INSULIN ASPART 100 UNIT/ML ~~LOC~~ SOLN
0.0000 [IU] | Freq: Three times a day (TID) | SUBCUTANEOUS | Status: DC
  Administered 2018-04-25 (×2): 2 [IU] via SUBCUTANEOUS
  Administered 2018-04-26: 13:00:00 1 [IU] via SUBCUTANEOUS
  Administered 2018-04-26: 2 [IU] via SUBCUTANEOUS
  Administered 2018-04-27: 1 [IU] via SUBCUTANEOUS
  Filled 2018-04-25 (×5): qty 1

## 2018-04-25 MED ORDER — LURASIDONE HCL 40 MG PO TABS
60.0000 mg | ORAL_TABLET | Freq: Every day | ORAL | Status: DC
  Administered 2018-04-25 – 2018-04-27 (×3): 60 mg via ORAL
  Filled 2018-04-25: qty 1
  Filled 2018-04-25 (×3): qty 2

## 2018-04-25 MED ORDER — INSULIN REGULAR HUMAN (CONC) 500 UNIT/ML ~~LOC~~ SOLN
75.0000 [IU] | Freq: Two times a day (BID) | SUBCUTANEOUS | Status: DC
  Filled 2018-04-25: qty 20

## 2018-04-25 NOTE — Progress Notes (Signed)
Physical Therapy Evaluation Patient Details Name: [name] Day MRN: [id number] DOB: 24-Sep-1940 Today's Date: 04/25/2018   History of Present Illness  Pt. is a 77 year old male with a PMH of frequent falls, L sided Bells Palsy (started 3 months ago with dental work), DM and gait abnormalities who was admitted to [facility] for falls. Pt. reports that he has had 4 falls in 3 days. 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 The patient reports that he did have a history of a prior stroke 2 years ago which did affect his balance and he has had PT. He was supposed to start outpatient PT recently for diabetic neuropathy/balance issues.   Clinical Impression  Patient required assistance for safety with transfers and ambulation to and from the bathroom during his PT evaluation. Assistive device required during ambulation for safety and balance. No increased R hip pain was reported with in-room ambulation. Patient does present with decreased strength in the LLE. He is very motivated in regards to PT and feels that PT would benefit him in regards to his R hip and balance. He may benefit from PT while at [facility] to address strength, balance, gait and safety with mobility tasks.      Follow Up Recommendations SNF(versus HHPT followed by outpatient (dependent on progress))    Equipment Recommendations  Rolling walker with 5" wheels(dependent on progress (pt. does have a walker with seat))    Recommendations for Other Services OT consult;Speech consult(evaluations already requested)     Precautions / Restrictions Precautions Precautions: Fall Restrictions Weight Bearing Restrictions: No Other Position/Activity Restrictions: Spoke with RN prior to treatment; patient appropriate for PT in regards to R hip and CVA.       Mobility  Bed Mobility Overal bed mobility: Modified Independent       Supine to sit: Supervision Sit to supine: Supervision       Transfers Overall transfer level: Needs assistance Equipment used: Rolling walker (2 wheeled)   Sit to Stand: Min guard         General transfer comment: cues to reach for supporting surface, stay inside walker  Ambulation/Gait Ambulation/Gait assistance: Min guard(+2 to get to bathroom due to room layout/pt. fall history) Gait Distance (Feet): 10 Feet Assistive device: Rolling walker (2 wheeled) Gait Pattern/deviations: WFL(Within Functional Limits)     General Gait Details: No pain reported with gait; no significant antalgia noted with ambulation to bathroom; no increase in pain reported  Stairs            Wheelchair Mobility    Modified Rankin (Stroke Patients Only)       Balance   Sitting-balance support: Feet supported;Bilateral upper extremity supported Sitting balance-Leahy Scale: Good     Standing balance support: During functional activity Standing balance-Leahy Scale: Fair                               Pertinent Vitals/Pain Pain Assessment: 0-10 Pain Score: 3  Pain Location: R hip Pain Intervention(s): Limited activity within patient's tolerance    Home Living Family/patient expects to be discharged to:: Private residence     Type of Home: House       Home Layout: Two level Home Equipment: Walker - 4 wheels(with seat) Additional Comments: Pt. reports that he has been sleeping downstairs in a chair for the past 2 years.     Prior Function Level of Independence: Independent  with assistive device(s)         Comments: Due to R hip pain, his mobility recently has declined.      Hand Dominance        Extremity/Trunk Assessment   Upper Extremity Assessment Upper Extremity Assessment: Defer to OT evaluation;Overall WFL for tasks assessed(elbow flex/ext and shoulder flex/grip appear equal bilat.)    Lower Extremity Assessment RLE Deficits / Details: hip flexion 4/5; knee flexion & extension 5/5; ankle DF 5/5 RLE  Sensation: WNL LLE Deficits / Details: hip flexion 4-5; knee flex & ext 3+/5-4-/5; ankle DF 5/5 LLE Sensation: WNL       Communication   Communication: No difficulties  Cognition Arousal/Alertness: Awake/alert Behavior During Therapy: WFL for tasks assessed/performed(occasional impulsive movements during evaluation) Overall Cognitive Status: Within Functional Limits for tasks assessed                                 General Comments: Finger to chin testing appears equal bilaterally      General Comments      Exercises     Assessment/Plan    PT Assessment Patient needs continued PT services  PT Problem List Decreased strength;Decreased mobility;Decreased safety awareness;Pain;Decreased balance       PT Treatment Interventions DME instruction;Gait training;Stair training;Functional mobility training;Therapeutic activities;Therapeutic exercise;Balance training    PT Goals (Current goals can be found in the Care Plan section)  Acute Rehab PT Goals Patient Stated Goal: To go to rehab to get stronger, work on R hip and balance PT Goal Formulation: With patient Time For Goal Achievement: 05/09/18 Potential to Achieve Goals: Good    Frequency 7X/week   Barriers to discharge        Co-evaluation               AM-PAC PT "6 Clicks" Daily Activity  Outcome Measure Difficulty turning over in bed (including adjusting bedclothes, sheets and blankets)?: A Little Difficulty moving from lying on back to sitting on the side of the bed? : A Little Difficulty sitting down on and standing up from a chair with arms (e.g., wheelchair, bedside commode, etc,.)?: A Little Help needed moving to and from a bed to chair (including a wheelchair)?: A Little Help needed walking in hospital room?: A Little Help needed climbing 3-5 steps with a railing? : A Lot 6 Click Score: 17    End of Session Equipment Utilized During Treatment: Gait belt Activity Tolerance: Patient  tolerated treatment well;No increased pain Patient left: in bed;with bed alarm set;with call bell/phone within reach Nurse Communication: Mobility status PT Visit Diagnosis: Unsteadiness on feet (R26.81);Other abnormalities of gait and mobility (R26.89);Repeated falls (R29.6);Muscle weakness (generalized) (M62.81);History of falling (Z91.81);Pain Pain - Right/Left: Right Pain - part of body: Hip    Time: 1452-1535 PT Time Calculation (min) (ACUTE ONLY): 43 min   Charges:   PT Evaluation $PT Eval Low Complexity: 1 Low     Nehemiah Massed, PT, DPT, CWCE     Ladell Pier Kristofor Michalowski 04/25/2018, 4:15 PM

## 2018-04-25 NOTE — Progress Notes (Signed)
NIH-1 with intermittent expressive aphasia. Neuro ck's and vs's stable. [place] and CT of right hip completed. Neuro consult done.  PT consult done. Family in to see pt.

## 2018-04-25 NOTE — Progress Notes (Signed)
Family Meeting Note  Advance Directive:no  Today a meeting took place with the Patient.  The following clinical team members were present during this meeting:MD  The following were discussed:Patient's diagnosis: cva, Patient's progosis: > 12 months and Goals for treatment: DNR  Additional follow-up to be provided: dnr order inc hart and chaplain consult to create advanced directives  Time spent during discussion:16 minutes  [name] Rijos, MD

## 2018-04-25 NOTE — Consult Note (Signed)
Reason for Consult:falls Referring Physician: Dr. Benjie Day  CC: falls   HPI: [name] Day is an 77 y.o. male presents with increased frequence of falls .  He went for a few weeks without a fall, but over the last 2 to 3 days has had 3 falls.  He was complaining of right hip pain, but tonight states that he thinks it may be that he was compensating and overusing that hip.  Pt has chronic DM and sees Dr. Doy Day.    Past Medical History:  Diagnosis Date  . Anemia   . Bipolar 1 disorder ([place])   . Carotid artery occlusion   . Dementia   . Diabetes mellitus without complication ([place])   . Diabetic peripheral neuropathy ([place]) 03/03/2018  . Gait abnormality 02/12/2017  . Left-sided Bell's palsy 03/03/2018  . Memory disorder 02/12/2017  . TIA (transient ischemic attack)     Past Surgical History:  Procedure Laterality Date  . COLONOSCOPY WITH PROPOFOL N/A 02/25/2017   Procedure: COLONOSCOPY WITH PROPOFOL;  Surgeon: Manya Silvas, MD;  Location: [facility] ENDOSCOPY;  Service: Endoscopy;  Laterality: N/A;  . ESOPHAGOGASTRODUODENOSCOPY (EGD) WITH PROPOFOL N/A 02/25/2017   Procedure: ESOPHAGOGASTRODUODENOSCOPY (EGD) WITH PROPOFOL;  Surgeon: Manya Silvas, MD;  Location: [facility] ENDOSCOPY;  Service: Endoscopy;  Laterality: N/A;  . NASAL SINUS SURGERY    . PACEMAKER PLACEMENT    . TONSILLECTOMY    . TONSILLECTOMY      Family History  Problem Relation Age of Onset  . Allergies Mother   . Diabetes Father   . Diabetes Sister     Social History:  reports that he has quit smoking. His smoking use included cigarettes and cigars. He smoked 1.00 pack per day. He has never used smokeless tobacco. He reports that he drinks alcohol. He reports that he does not use drugs.  Allergies  Allergen Reactions  . Lisinopril Other (See Comments)    REACTION: Cough  . Penicillins Other (See Comments)    Reaction: unknown Has patient had a PCN reaction causing immediate rash, facial/tongue/throat swelling, SOB or  lightheadedness with hypotension: Unknown Has patient had a PCN reaction causing severe rash involving mucus membranes or skin necrosis: Unknown Has patient had a PCN reaction that required hospitalization: Unknown Has patient had a PCN reaction occurring within the last 10 years: Unknown If all of the above answers are "NO", then may proceed with Cephalosporin use.   . Clindamycin/Lincomycin Rash and Other (See Comments)    "turns me purple" "turns me orange"    Medications: I have reviewed the patient's current medications.  ROS: History obtained from the patient  General ROS: negative for - chills, fatigue, fever, night sweats, weight gain or weight loss Psychological ROS: negative for - behavioral disorder, hallucinations, memory difficulties, mood swings or suicidal ideation Ophthalmic ROS: negative for - blurry vision, double vision, eye pain or loss of vision ENT ROS: negative for - epistaxis, nasal discharge, oral lesions, sore throat, tinnitus or vertigo Allergy and Immunology ROS: negative for - hives or itchy/watery eyes Hematological and Lymphatic ROS: negative for - bleeding problems, bruising or swollen lymph nodes Endocrine ROS: negative for - galactorrhea, hair pattern changes, polydipsia/polyuria or temperature intolerance Respiratory ROS: negative for - cough, hemoptysis, shortness of breath or wheezing Cardiovascular ROS: negative for - chest pain, dyspnea on exertion, edema or irregular heartbeat Gastrointestinal ROS: negative for - abdominal pain, diarrhea, hematemesis, nausea/vomiting or stool incontinence Genito-Urinary ROS: negative for - dysuria, hematuria, incontinence or urinary frequency/urgency Musculoskeletal  ROS: negative for - joint swelling or muscular weakness Neurological ROS: as noted in HPI Dermatological ROS: negative for rash and skin lesion changes  Physical Examination: Blood pressure (!) 147/53, pulse 60, temperature 98.7 F (37.1 C),  temperature source Oral, resp. rate 20, height 5\' 9"  (1.753 m), weight 108.9 kg, SpO2 96 %.  HEENT-  Normocephalic, no lesions, without obvious abnormality.  Normal external eye and conjunctiva.  Normal TM's bilaterally.  Normal auditory canals and external ears. Normal external nose, mucus membranes and septum.  Normal pharynx. Cardiovascular- regular rate and rhythm, S1, S2 normal, no murmur, click, rub or gallop, pulses palpable throughout   Lungs- Heart exam - S1, S2 normal, no murmur, no gallop, rate regular Abdomen- soft, non-tender; bowel sounds normal; no masses,  no organomegaly Extremities- [name] then 2 second capillary refill Lymph-no adenopathy palpable Musculoskeletal-no joint tenderness, deformity or swelling Skin-warm and dry, no hyperpigmentation, vitiligo, or suspicious lesions  Neurological Examination   Mental Status: Alert, oriented, thought content appropriate.  Speech fluent without evidence of aphasia.  Able to follow 3 step commands without difficulty. Cranial Nerves: II: Discs flat bilaterally; Visual fields grossly normal, pupils equal, round, reactive to light and accommodation III,IV, VI: ptosis not present, extra-ocular motions intact bilaterally V,VII: smile symmetric, facial light touch sensation normal bilaterally VIII: hearing normal bilaterally IX,X: gag reflex present XI: bilateral shoulder shrug XII: midline tongue extension Motor: Right : Upper extremity   5/5    Left:     Upper extremity   5/5  Lower extremity   5/5     Lower extremity   5/5 Tone and bulk:normal tone throughout; no atrophy noted Sensory: Pinprick and light touch intact throughout, bilaterally Deep Tendon Reflexes: 1+ and symmetric throughout Plantars: Right: downgoing   Left: downgoing Cerebellar: normal finger-to-nose Gait: not tested      Laboratory Studies:   Basic Metabolic Panel: Recent Labs  Lab 04/24/18 1756  NA 139  K 3.9  CL 104  CO2 27  GLUCOSE 66*  BUN 11   CREATININE 0.58*  CALCIUM 9.1    Liver Function Tests: Recent Labs  Lab 04/24/18 1756  AST 55*  ALT 16  ALKPHOS 73  BILITOT 3.3*  PROT 6.8  ALBUMIN 4.0   No results for input(s): LIPASE, AMYLASE in the last 168 hours. No results for input(s): AMMONIA in the last 168 hours.  CBC: Recent Labs  Lab 04/24/18 1756  WBC 5.3  HGB 11.6*  HCT 34.6*  MCV 68.8*  PLT 105*    Cardiac Enzymes: Recent Labs  Lab 04/24/18 1756  TROPONINI 0.03*    BNP: Invalid input(s): POCBNP  CBG: Recent Labs  Lab 04/24/18 2004 04/24/18 2024 04/24/18 2117 04/25/18 0054 04/25/18 0737  GLUCAP 43* 50* 116* 106* 118*    Microbiology: Results for orders placed or performed in visit on 11/05/16  Microscopic Examination     Status: Abnormal   Collection Time: 11/05/16  3:14 PM  Result Value Ref Range Status   WBC, UA 0-5 0 - 5 /hpf Final   RBC, UA 3-10 (A) 0 - 2 /hpf Final   Epithelial Cells (non renal) 0-10 0 - 10 /hpf Final   Bacteria, UA Few None seen/Few Final    Coagulation Studies: No results for input(s): LABPROT, INR in the last 72 hours.  Urinalysis:  Recent Labs  Lab 04/24/18 1756  COLORURINE YELLOW*  LABSPEC 1.004*  PHURINE 6.0  GLUCOSEU NEGATIVE  HGBUR NEGATIVE  BILIRUBINUR NEGATIVE  KETONESUR NEGATIVE  PROTEINUR NEGATIVE  NITRITE NEGATIVE  LEUKOCYTESUR SMALL*    Lipid Panel:     Component Value Date/Time   CHOL 152 04/25/2018 0400   TRIG 103 04/25/2018 0400   HDL 35 (L) 04/25/2018 0400   CHOLHDL 4.3 04/25/2018 0400   VLDL 21 04/25/2018 0400   LDLCALC 96 04/25/2018 0400    HgbA1C:  Lab Results  Component Value Date   HGBA1C 9.1 (H) 04/25/2018    Urine Drug Screen:  No results found for: LABOPIA, COCAINSCRNUR, LABBENZ, AMPHETMU, THCU, LABBARB  Alcohol Level: No results for input(s): ETH in the last 168 hours.   Ct Head Wo Contrast  Result Date: 04/24/2018 CLINICAL DATA:  Delete head trauma EXAM: CT HEAD WITHOUT CONTRAST TECHNIQUE: Contiguous  axial images were obtained from the base of the skull through the vertex without intravenous contrast. COMPARISON:  02/04/2018 head CT FINDINGS: Brain: No hemorrhage or intracranial mass is visualized. Atrophy and small vessel ischemic changes of the white matter. Possible acute to subacute lacunar infarct within the right periventricular white matter, new since 02/04/2018. Stable prominent ventricle size. Chronic lacunar infarcts within the bilateral basal ganglia. Vascular: No hyperdense vessels. Vertebral and carotid vascular calcification Skull: Normal. Negative for fracture or focal lesion. Sinuses/Orbits: Mucosal opacification of the right ethmoid sinuses. No acute orbital abnormality. Other: None IMPRESSION: 1. Small focal hypodensity within the right periventricular white matter, may reflect possible acute or subacute lacunar infarct. This is new as compared with 02/04/2018. 2. Atrophy with small vessel ischemic changes of the white matter. Electronically Signed   By: Donavan Foil M.D.   On: 04/24/2018 19:52   Ct Hip Right Wo Contrast  Result Date: 04/25/2018 CLINICAL DATA:  Right hip pain EXAM: CT OF THE RIGHT HIP WITHOUT CONTRAST TECHNIQUE: Multidetector CT imaging of the right hip was performed according to the standard protocol. Multiplanar CT image reconstructions were also generated. COMPARISON:  None. FINDINGS: Bones/Joint/Cartilage Small cystic lesion along the anterior junction of the humeral head and neck compatible with synovial herniation pit. No hip joint effusion. Mild spurring of the acetabulum and right femoral head. Mild irregularity of the articulation of the sacrococcygeal junction for example on image 110/5, probably incidental, [name] likely due to an old fracture. Given the lack of significant presacral edema I am skeptical that this is acute. There is mild spurring at the sacroiliac joint. Mild degenerative articular cartilage thinning in the right hip. Ligaments Suboptimally  assessed by CT. Muscles and Tendons There is an apparent lipoma interposed between the gluteus maximus and the piriformis and quadratus femoris muscles which may have some mild mass effect on the sciatic nerve both at and distal to the sciatic notch. Soft tissues There is low-level edema in the rectosigmoid mesentery on image 3/41, cause uncertain. Several sigmoid colon diverticula are present. There is some abnormal but nonspecific fluid along the right lower paracolic gutter anteriorly. Calcification of the vas deferens noted. Small direct and indirect right inguinal hernias contain adipose tissue. Fatty left spermatic cord seen on the lower images. IMPRESSION: 1. No fracture or acute bony abnormality is observed. 2. 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 This appears to be flattening the piriformis muscle and may be leading to some sciatic notch impingement for example on image 37/11. 3. Mild degenerative chondral thinning in the right hip. 4. Small amount of free pelvic fluid along the anterior right paracolic gutter, abnormal but nonspecific. There is also some low-level edema in the rectosigmoid mesentery, nonspecific. Several  scattered sigmoid colon diverticula are present. 5. Small direct and indirect right inguinal hernias containing adipose tissues. Electronically Signed   By: Van Clines M.D.   On: 04/25/2018 10:02   US Carotid Bilateral (at [facility] Only)  Result Date: 04/25/2018 CLINICAL DATA:  Expressive aphasia EXAM: BILATERAL CAROTID DUPLEX ULTRASOUND TECHNIQUE: Pearline Cables scale imaging, color Doppler and duplex ultrasound were performed of bilateral carotid and vertebral arteries in the neck. COMPARISON:  None. FINDINGS: Criteria: Quantification of carotid stenosis is based on velocity parameters that correlate the residual internal carotid diameter with NASCET-based stenosis levels, using the diameter of the distal internal carotid lumen as the  denominator for stenosis measurement. The following velocity measurements were obtained: RIGHT ICA: 98 cm/sec CCA: 78 cm/sec SYSTOLIC ICA/CCA RATIO:  1.3 ECA: 112 cm/sec LEFT ICA: 116 cm/sec CCA: 878 cm/sec SYSTOLIC ICA/CCA RATIO:  1.1 ECA: 96 cm/sec RIGHT CAROTID ARTERY: Moderate irregular calcified plaque in the bulb. Low resistance internal carotid Doppler pattern. RIGHT VERTEBRAL ARTERY:  Antegrade. LEFT CAROTID ARTERY: Little if any plaque in the bulb. Low resistance internal carotid Doppler pattern. LEFT VERTEBRAL ARTERY:  Antegrade. IMPRESSION: [name] than 50% stenosis in the right and left internal carotid arteries. Electronically Signed   By: Marybelle Killings M.D.   On: 04/25/2018 09:51     Assessment/Plan:  77 y.o. male presents with increased frequence of falls .  He went for a few weeks without a fall, but over the last 2 to 3 days has had 3 falls.  He was complaining of right hip pain, but tonight states that he thinks it may be that he was compensating and overusing that hip.  Pt has chronic DM and sees Dr. Doy Day.    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  - Pt was not compliant with his ASA at home - Don't think falls are contributed by the stroke due to location  - Pt has chronic neuropathy in setting of DM.  - Wants in pt Physical therapy.  - Bells Palsy on L side is about 11 weeks old.   - no further imaging neuro stand point 04/25/2018, 11:04 AM

## 2018-04-25 NOTE — Progress Notes (Signed)
Pharmacy Home Medication Reconciliation Communication High Risk Medication: Humulin U-500 Insulin  Home dose of Humulin U-500 insulin was reordered for this patient. The dose was verified with the patient as listed below:  Type of syringe used at home:  U100 syringe Number or line on syringe to which patient draws up insulin: 25  This equates to 125 units of U-500 insulin  Other comments pertinent to patient home dosing: Patient reports using 0.25-0.27 ml or 125-135 units per dose. Due to hypoglycemia on admission, will recommend reduced dose initially.Ulice Dash D 04/25/2018  9:29 AM

## 2018-04-25 NOTE — Progress Notes (Signed)
[facility] [place] NAME: [name] Day    MR#:  [id number]  DATE OF BIRTH:  1941-05-08  SUBJECTIVE:   patient here with falls right hip pain seems to be bothering him Using walker but bearing no weight on right hip  Aphasia improved Has bell's palsy REVIEW OF SYSTEMS:    Review of Systems  Constitutional: Negative for fever, chills weight loss HENT: Negative for ear pain, nosebleeds, congestion, facial swelling, rhinorrhea, neck pain, neck stiffness and ear discharge.   Respiratory: Negative for cough, shortness of breath, wheezing  Cardiovascular: Negative for chest pain, palpitations and leg swelling.  Gastrointestinal: Negative for heartburn, abdominal pain, vomiting, diarrhea or consitpation Genitourinary: Negative for dysuria, urgency, frequency, hematuria Musculoskeletal: Negative for back pain or ++RIGHT HIP PAIN  Neurological: Negative for dizziness, seizures, syncope, focal weakness,  numbness and headaches.  Left facial droop from bell's plasy Aphasia improved Hematological: Does not bruise/bleed easily.  Psychiatric/Behavioral: Negative for hallucinations, confusion, dysphoric mood    Tolerating Diet: yes      DRUG ALLERGIES:   Allergies  Allergen Reactions  . Lisinopril Other (See Comments)    REACTION: Cough  . Penicillins Other (See Comments)    Reaction: unknown Has patient had a PCN reaction causing immediate rash, facial/tongue/throat swelling, SOB or lightheadedness with hypotension: Unknown Has patient had a PCN reaction causing severe rash involving mucus membranes or skin necrosis: Unknown Has patient had a PCN reaction that required hospitalization: Unknown Has patient had a PCN reaction occurring within the last 10 years: Unknown If all of the above answers are "NO", then may proceed with Cephalosporin use.   . Clindamycin/Lincomycin Rash and Other (See Comments)    "turns me purple" "turns me  orange"    VITALS:  Blood pressure (!) 143/79, pulse 60, temperature 97.8 F (36.6 C), temperature source Oral, resp. rate 20, height 5\' 9"  (1.753 m), weight 108.9 kg, SpO2 98 %.  PHYSICAL EXAMINATION:  Constitutional: Appears well-developed and well-nourished. No distress. HENT: Normocephalic. Marland Kitchen Oropharynx is clear and moist.  Eyes: Conjunctivae and EOM are normal. PERRLA, no scleral icterus.  Neck: Normal ROM. Neck supple. No JVD. No tracheal deviation. CVS: RRR, S1/S2 +, no murmurs, no gallops, no carotid bruit.  Pulmonary: Effort and breath sounds normal, no stridor, rhonchi, wheezes, rales.  Abdominal: Soft. BS +,  no distension, tenderness, rebound or guarding.  Musculoskeletal:.pain joint line right hip able to move right hip but difficult wiih flexion and adduction  No edema and no tenderness.  Neuro: Alert. 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 Skin: Skin is warm and dry. No rash noted. Psychiatric: Normal mood and affect.      LABORATORY PANEL:   CBC Recent Labs  Lab 04/24/18 1756  WBC 5.3  HGB 11.6*  HCT 34.6*  PLT 105*   ------------------------------------------------------------------------------------------------------------------  Chemistries  Recent Labs  Lab 04/24/18 1756  NA 139  K 3.9  CL 104  CO2 27  GLUCOSE 66*  BUN 11  CREATININE 0.58*  CALCIUM 9.1  AST 55*  ALT 16  ALKPHOS 73  BILITOT 3.3*   ------------------------------------------------------------------------------------------------------------------  Cardiac Enzymes Recent Labs  Lab 04/24/18 1756  TROPONINI 0.03*   ------------------------------------------------------------------------------------------------------------------  RADIOLOGY:  Ct Head Wo Contrast  Result Date: 04/24/2018 CLINICAL DATA:  Delete head trauma EXAM: CT HEAD WITHOUT CONTRAST TECHNIQUE: Contiguous axial images were obtained from the base of the skull through the vertex without intravenous  contrast. COMPARISON:  02/04/2018 head CT FINDINGS:  Brain: No hemorrhage or intracranial mass is visualized. Atrophy and small vessel ischemic changes of the white matter. Possible acute to subacute lacunar infarct within the right periventricular white matter, new since 02/04/2018. Stable prominent ventricle size. Chronic lacunar infarcts within the bilateral basal ganglia. Vascular: No hyperdense vessels. Vertebral and carotid vascular calcification Skull: Normal. Negative for fracture or focal lesion. Sinuses/Orbits: Mucosal opacification of the right ethmoid sinuses. No acute orbital abnormality. Other: None IMPRESSION: 1. Small focal hypodensity within the right periventricular white matter, may reflect possible acute or subacute lacunar infarct. This is new as compared with 02/04/2018. 2. Atrophy with small vessel ischemic changes of the white matter. Electronically Signed   By: Donavan Foil M.D.   On: 04/24/2018 19:52     ASSESSMENT AND PLAN:   77 year old male with Bell's palsy and previous TIA who presents after several falls with CT concerning for stroke.  1.  Acute/subacute lacunar infarct: Follow-up on echo and carotid Doppler Patient has pacemaker and unable to get MRI Neurology consultation requested PT, OT consultation requested Continue aspirin and statin  LDL 96 A1c 9.1  2.  Right hip pain after mechanical fall: CT right hip to rule out fracture  3.  Uncontrolled diabetes with A1c of 9.1: Patient will need close outpatient follow-up regarding his diabetes Continue outpatient regimen with sliding scale and ADA diet    4.  BPH: Continue Flomax  5.  Essential hypertension: Continue Cozaar for now Due to possibility of acute stroke do not want to drop blood pressure significantly   Management plans discussed with the patient and he is in agreement.  CODE STATUS: Full  TOTAL TIME TAKING CARE OF THIS PATIENT: 27 minutes.     POSSIBLE D/C tomorrow, DEPENDING ON  CLINICAL CONDITION.   Brannon Decaire M.D on 04/25/2018 at 9:15 AM  Between 7am to 6pm - Pager - [phone] After 6pm go to [email or web address] - password [email or web address] [facility]  Office  [phone]  CC: Primary care physician; Idelle Crouch, MD  Note: This dictation was prepared with Dragon dictation along with smaller phrase technology. Any transcriptional errors that result from this process are unintentional.

## 2018-04-25 NOTE — Progress Notes (Signed)
PT Cancellation Note  Patient Details Name: SHAHIL SPEEGLE MRN: [id number] DOB: 09/04/1940   Cancelled Treatment:    Reason Eval/Treat Not Completed: Patient at procedure or test/unavailable. PT evaluation attempted; however, patient was out of room for testing. Spoke with RN and patient appropriate for PT evaluation once he returns from testing. Will check back when patient is available.    Ladell Pier Trendon Zaring, PT, DPT, CWCE 04/25/2018, 9:03 AM

## 2018-04-25 NOTE — Progress Notes (Signed)
Chaplain received OR for AD and visit patient and educated him on AD. He was not decided on what he would like to do, so Chaplain left AD with him and told him to call when he was ready. Patient thanked Clinical biochemist for education.

## 2018-04-26 ENCOUNTER — Inpatient Hospital Stay
Admit: 2018-04-26 | Discharge: 2018-04-26 | Disposition: A | Discharge status: Home / Self Care | Payer: Medicare Other | Attending: Internal Medicine | Admitting: Internal Medicine

## 2018-04-26 LAB — GLUCOSE, CAPILLARY
GLUCOSE-CAPILLARY: 140 mg/dL — AB (ref 70–99)
Glucose-Capillary: 87 mg/dL (ref 70–99)
Glucose-Capillary: 130 mg/dL — ABNORMAL HIGH (ref 70–99)
Glucose-Capillary: 136 mg/dL — ABNORMAL HIGH (ref 70–99)
Glucose-Capillary: 177 mg/dL — ABNORMAL HIGH (ref 70–99)

## 2018-04-26 LAB — ECHOCARDIOGRAM COMPLETE
Height: 69 in
Weight: 3840 oz

## 2018-04-26 MED ORDER — ADULT MULTIVITAMIN W/MINERALS CH
1.0000 | ORAL_TABLET | Freq: Every day | ORAL | Status: DC
  Administered 2018-04-26 – 2018-04-27 (×2): 1 via ORAL
  Filled 2018-04-26 (×2): qty 1

## 2018-04-26 MED ORDER — PRAVASTATIN SODIUM 20 MG PO TABS
40.0000 mg | ORAL_TABLET | Freq: Every day | ORAL | Status: DC
  Administered 2018-04-26 – 2018-04-27 (×2): 40 mg via ORAL
  Filled 2018-04-26 (×2): qty 2

## 2018-04-26 MED ORDER — LAMOTRIGINE 25 MG PO TABS
50.0000 mg | ORAL_TABLET | Freq: Two times a day (BID) | ORAL | Status: DC
  Administered 2018-04-26 (×2): 50 mg via ORAL
  Filled 2018-04-26 (×2): qty 2

## 2018-04-26 MED ORDER — INSULIN REGULAR HUMAN (CONC) 500 UNIT/ML ~~LOC~~ SOPN
50.0000 [IU] | PEN_INJECTOR | Freq: Two times a day (BID) | SUBCUTANEOUS | Status: DC
  Administered 2018-04-26 – 2018-04-27 (×3): 50 [IU] via SUBCUTANEOUS
  Filled 2018-04-26 (×2): qty 3

## 2018-04-26 MED ORDER — INSULIN REGULAR HUMAN (CONC) 500 UNIT/ML ~~LOC~~ SOPN
50.0000 [IU] | PEN_INJECTOR | Freq: Two times a day (BID) | SUBCUTANEOUS | Status: DC
  Filled 2018-04-26: qty 3

## 2018-04-26 MED ORDER — PREMIER PROTEIN SHAKE
11.0000 [oz_av] | Freq: Two times a day (BID) | ORAL | Status: DC
  Administered 2018-04-27: 11 [oz_av] via ORAL

## 2018-04-26 MED ORDER — ALLOPURINOL 300 MG PO TABS
300.0000 mg | ORAL_TABLET | Freq: Every day | ORAL | Status: DC | PRN
  Filled 2018-04-26: qty 1

## 2018-04-26 NOTE — Evaluation (Signed)
Occupational Therapy Evaluation Patient Details Name: [name] Day MRN: [id number] DOB: [birth year] Today's Date: 04/26/2018    History of Present Illness Pt. is a 77 year old male with a PMH of frequent falls, L sided Bells Palsy (started 3 months ago with dental work), DM and gait abnormalities who was admitted to [facility] for falls. Pt. reports that he has had 4 falls in 3 days. 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 The patient reports that he did have a history of a prior stroke 2 years ago which did affect his balance and he has had PT. He was supposed to start outpatient PT recently for diabetic neuropathy/balance issues.    Clinical Impression   Pt is 77 year old male who presents with PMH of frequent falls in bathroom and in living room.  He has subacute/chronic infarct in periventricular space and a hx of CVA 2 years ago.  He has a friend who is also in her 46s who lives with him but mainly provides supervision and no physical assist.  He uses a sock aid and has velcro shoes with orthotic inserts.  He has been sleeping in a recliner for the past 2 years but he and his son are looking into a bed that has electric function to raise and lower the head of the bed to help assist with transfers in/out of bed.  Pt presents with decreased safety awareness is hidden by his ability to talk a lot and brings up past situations.  He is aware that he leans to the R as he fatigues.  He has adequate strength in BUEs for ADLs but has fair stamina and decreased safety and at risk for falls.  He has grab bars in shower and toilet. Pt was able to transfer from lying in bed to EOB with HOB up with min guard assist and cues and then transfer to standing with min guard assist and cues.  Rec continued OT while in hospital to continue to work on increasing independence in ADLs, balance and functional mobility training, coordination exercises and family ed and training and  SNF after discharge.    Follow Up Recommendations  SNF    Equipment Recommendations       Recommendations for Other Services       Precautions / Restrictions Precautions Precautions: Fall Restrictions Weight Bearing Restrictions: No Other Position/Activity Restrictions: Chair alarm placed and activated and NSG updated.  Son [name] Day in room as well.      Mobility Bed Mobility                  Transfers                      Balance                                           ADL either performed or assessed with clinical judgement   ADL Overall ADL's : Needs assistance/impaired Eating/Feeding: Independent;Set up   Grooming: Wash/dry hands;Wash/dry face;Oral care;Set up;Supervision/safety;Standing Grooming Details (indicate cue type and reason): supervised for balance and problem solving standing at sink after urinating sitting which was dark orange in color and NSG notified Upper Body Bathing: Independent;Set up;Sitting Upper Body Bathing Details (indicate cue type and reason): sponge bath only Lower Body Bathing: Minimal assistance;Set up;Supervison/ safety;Cueing for safety  Upper Body Dressing : Independent;Set up   Lower Body Dressing: Minimal assistance;Set up;Cueing for sequencing;Cueing for safety Lower Body Dressing Details (indicate cue type and reason): cueing to stay on task and is easily distracted by a lot of talking and difficulty focusing on task Toilet Transfer: Min guard;Set up;Stand-pivot;Regular Toilet;Grab bars;Cueing for safety;RW Toilet Transfer Details (indicate cue type and reason): Mod cues for safety when turning to keep walker with him and not push it aside and move without it.           Functional mobility during ADLs: Min guard;Set up;Cueing for safety;Rolling walker General ADL Comments: Close supervision and min guard assist for ambulation using FWW and gets distracted easily which decreases safety with  ambulation.  Decreased problem solving but good insight into deficitis as far as decreased trunk strength and that he leans to the R.     Vision Baseline Vision/History: Wears glasses Wears Glasses: At all times Patient Visual Report: No change from baseline       Perception     Praxis      Pertinent Vitals/Pain Pain Assessment: 0-10 Pain Score: 2  Pain Location: R hip Pain Descriptors / Indicators: Aching;Discomfort Pain Intervention(s): Limited activity within patient's tolerance;Monitored during session     Hand Dominance Right   Extremity/Trunk Assessment Upper Extremity Assessment Upper Extremity Assessment: Overall WFL for tasks assessed(decreased stamina for activtites and leans to R as he tires)   Lower Extremity Assessment Lower Extremity Assessment: Defer to PT evaluation       Communication Communication Communication: No difficulties   Cognition Arousal/Alertness: Awake/alert Behavior During Therapy: WFL for tasks assessed/performed;Restless;Anxious Overall Cognitive Status: Within Functional Limits for tasks assessed                                 General Comments: Hx of cognitive impairments per son [name] Day's report and recalls facts of medical hx but not in order.     General Comments       Exercises     Shoulder Instructions      Home Living Family/patient expects to be discharged to:: Private residence Living Arrangements: Non-relatives/Friends   Type of Home: House       Home Layout: Two level Alternate Level Stairs-Number of Steps: chair lift   Bathroom Shower/Tub: Walk-in shower   Bathroom Toilet: Handicapped height     Home Equipment: Walker - 4 wheels;Grab bars - tub/shower;Grab bars - toilet   Additional Comments: Pt. reports that he has been sleeping downstairs in a chair for the past 2 years.       Prior Functioning/Environment Level of Independence: Independent with assistive device(s)        Comments:  Due to R hip pain and leaning to the R when upright and walking, his mobility recently has declined.         OT Problem List: Impaired balance (sitting and/or standing);Decreased cognition;Pain;Decreased safety awareness;Decreased activity tolerance;Obesity      OT Treatment/Interventions: Self-care/ADL training;Therapeutic activities;Therapeutic exercise;Patient/family education    OT Goals(Current goals can be found in the care plan section) Acute Rehab OT Goals Patient Stated Goal: To go to rehab to get stronger, work on R hip and balance OT Goal Formulation: With patient/family Time For Goal Achievement: 05/10/18 Potential to Achieve Goals: Good ADL Goals Pt Will Perform Lower Body Dressing: with set-up;with min assist;sit to/from stand Pt Will Transfer to Toilet: with supervision;with set-up;stand pivot transfer;ambulating;regular height toilet;grab  bars Pt/caregiver will Perform Home Exercise Program: Both right and left upper extremity;With written HEP provided  OT Frequency: Min 1X/week   Barriers to D/C:            Co-evaluation              AM-PAC PT "6 Clicks" Daily Activity     Outcome Measure Help from another person eating meals?: None Help from another person taking care of personal grooming?: None Help from another person toileting, which includes using toliet, bedpan, or urinal?: A Little Help from another person bathing (including washing, rinsing, drying)?: A Little Help from another person to put on and taking off regular upper body clothing?: None Help from another person to put on and taking off regular lower body clothing?: A Little 6 Click Score: 21   End of Session Equipment Utilized During Treatment: Gait belt Nurse Communication: Other (comment)(chair alarm in place and active)  Activity Tolerance: Patient tolerated treatment well Patient left: in chair;with call bell/phone within reach;with chair alarm set;with family/visitor present  OT  Visit Diagnosis: Unsteadiness on feet (R26.81);History of falling (Z91.81);Muscle weakness (generalized) (M62.81)                Time: 1216-2446 OT Time Calculation (min): 60 min Charges:  OT General Charges $OT Visit: 1 Visit OT Evaluation $OT Eval Low Complexity: 1 Low OT Treatments $Self Care/Home Management : 23-37 mins $Therapeutic Activity: 8-22 mins  Chrys Racer, OTR/L ascom [phone] 04/26/18, 12:14 PM

## 2018-04-26 NOTE — Progress Notes (Signed)
*  PRELIMINARY RESULTS* Echocardiogram 2D Echocardiogram has been performed.  [name] Day 04/26/2018, 9:55 AM

## 2018-04-26 NOTE — Progress Notes (Signed)
Initial Nutrition Assessment  DOCUMENTATION CODES:   Obesity unspecified  INTERVENTION:   Premier Protein BID, each supplement provides 160 kcal and 30 grams of protein.   MVI daily  Liberalize diet   NUTRITION DIAGNOSIS:   Increased nutrient needs related to acute illness as evidenced by increased estimated needs.  GOAL:   Patient will meet greater than or equal to 90% of their needs  MONITOR:   PO intake, Supplement acceptance, Labs, Weight trends, Skin, I & O's  REASON FOR ASSESSMENT:   Malnutrition Screening Tool    ASSESSMENT:   77 year old male with past medical history of bipolar disorder, dementia, diabetes, diabetic neuropathy, history of Bell's palsy, previous TIA, chronic anemia who presents to the hospital due to recurrent falls and noted to have a acute/subacute CVA.   RD met with pt and pt's son in room today. History provided by pt's son at bedside. Per pt's son, pt with good appetite and oral intake at baseline. Pt's son reports that pt pretty much eats what he wants at home and does not follow a diabetic diet. Per chart, pt appears weight stable pta; RD unsure if admit weight is measured or reported. Pt currently eating 100% of meals. RD will add vanilla Premier Protein to help pt meet his estimated protein needs. RD will also liberalize the heart heathy restriction from pt's diet as this restricts protein also. Pt noted to have microcytic anemia; would recommend check iron/anemia labs to r/o deficiency.    Medications reviewed and include: aspirin, lovenox, insulin, protonix  Labs reviewed: Hgb 11.6(L), Hgb 34.6(L), MCH 23.1(L), MCV 68.8(L)- 8/24 cbgs- 87, 130, 136 x 24 hrs AIC 9.1(H)- 8/25  NUTRITION - FOCUSED PHYSICAL EXAM:    Most Recent Value  Orbital Region  No depletion  Upper Arm Region  No depletion  Thoracic and Lumbar Region  No depletion  Buccal Region  No depletion  Temple Region  No depletion  Clavicle Bone Region  No depletion   Clavicle and Acromion Bone Region  No depletion  Scapular Bone Region  No depletion  Dorsal Hand  No depletion  Patellar Region  No depletion  Anterior Thigh Region  No depletion  Posterior Calf Region  No depletion  Edema (RD Assessment)  Mild [BLE]  Hair  Reviewed  Eyes  Reviewed  Mouth  Reviewed  Skin  Reviewed  Nails  Reviewed     Diet Order:   Diet Order            Diet heart healthy/carb modified Room service appropriate? Yes; Fluid consistency: Thin  Diet effective now             EDUCATION NEEDS:   Education needs have been addressed  Skin:  Skin Assessment: Reviewed RN Assessment(ecchymosis, MASD)  Last BM:  8/23  Height:   Ht Readings from Last 1 Encounters:  04/24/18 5' 9"  (1.753 m)    Weight:   Wt Readings from Last 1 Encounters:  04/24/18 108.9 kg    Ideal Body Weight:  72.7 kg  BMI:  Body mass index is 35.44 kg/m.  Estimated Nutritional Needs:   Kcal:  2100-2400kcal/day   Protein:  109-120g/day   Fluid:  >2.1L/day   Koleen Distance MS, RD, LDN Pager #- [phone] Office#- [phone] After Hours Pager: [phone]

## 2018-04-26 NOTE — Progress Notes (Signed)
Inpatient Diabetes Program Recommendations  AACE/ADA: Consensus Statement on Inpatient Glycemic Control (2015)  Target Ranges:  Prepandial:   less than 140 mg/dL      Peak postprandial:   less than 180 mg/dL (1-2 hours)      Critically ill patients:  140 - 180 mg/dL     Results for JAVED, COTTO (MRN [id number]) as of 04/26/2018 13:15  Ref. Range 04/25/2018 16:54 04/25/2018 20:40 04/26/2018 07:47 04/26/2018 10:20 04/26/2018 11:54  Glucose-Capillary Latest Ref Range: 70 - 99 mg/dL 198 (H) 221 (H) 87 130 (H) 136 (H)    Results for JASHUN, PUERTAS (MRN [id number]) as of 04/26/2018 13:15  Ref. Range 04/25/2018 04:00  Hemoglobin A1C Latest Ref Range: 4.8 - 5.6 % 9.1 (H)    Admit with fall History: DM, TIA, Bipolar, Dementia  Home DM meds: Concentrated Regular U500 125units BID. Noted in MD office notes patient also on Metformin ER 500mg  BID (not listed on home meds per pharmacy on admission).  Current DM orders: Concentrated  Humulin R U-500 75 units BID ; Novolog sensitive scale tid ac/hs  RN this am noted CBG was 87 and spoke to MD and diabetes coordinator. Starting this am order changed from 75 units of U-500 to 50units of U-500 BID. CBGs have been much better today and agree with current insulin orders.   -- Will follow during hospitalization.--  Jonna Clark RN, MSN Diabetes Coordinator Inpatient Glycemic Control Team Team Pager: [phone] (8am-5pm)

## 2018-04-26 NOTE — Progress Notes (Signed)
Pt's CBG 87 prior to breakfast. 75units of Humolin R insulin ordered BID. Notified Dr Verdell Carmine of the above to clarify dose. Per Dr Verdell Carmine to contact diabetes coordinator. Spoke to diabetes coordinator and she will contact MD.

## 2018-04-26 NOTE — Progress Notes (Signed)
[facility] NAME: [name] Day    MR#:  [id number]  DATE OF BIRTH:  1941-05-13  SUBJECTIVE:   Patient here due to recurrent falls and noted to have a acute/subacute CVA.  He was also complaining of right hip pain but CT of his right hip was negative for acute fracture.  Patient still complaining of some pain in that right hip, patient's son is at bedside.  No other acute complaints or events overnight.  REVIEW OF SYSTEMS:    Review of Systems  Constitutional: Negative for chills and fever.  HENT: Negative for congestion and tinnitus.   Eyes: Negative for blurred vision and double vision.  Respiratory: Negative for cough, shortness of breath and wheezing.   Cardiovascular: Negative for chest pain, orthopnea and PND.  Gastrointestinal: Negative for abdominal pain, diarrhea, nausea and vomiting.  Genitourinary: Negative for dysuria and hematuria.  Musculoskeletal: Positive for joint pain (right hip).  Neurological: Negative for dizziness, sensory change and focal weakness.  All other systems reviewed and are negative.   Nutrition: Heart Healthy/Carb modified Tolerating Diet: Yes Tolerating PT: Eval noted.    DRUG ALLERGIES:   Allergies  Allergen Reactions  . Lisinopril Other (See Comments)    REACTION: Cough  . Penicillins Other (See Comments)    Reaction: unknown Has patient had a PCN reaction causing immediate rash, facial/tongue/throat swelling, SOB or lightheadedness with hypotension: Unknown Has patient had a PCN reaction causing severe rash involving mucus membranes or skin necrosis: Unknown Has patient had a PCN reaction that required hospitalization: Unknown Has patient had a PCN reaction occurring within the last 10 years: Unknown If all of the above answers are "NO", then may proceed with Cephalosporin use.   . Clindamycin/Lincomycin Rash and Other (See Comments)    "turns me purple" "turns me orange"     VITALS:  Blood pressure 138/63, pulse 63, temperature 99.1 F (37.3 C), temperature source Oral, resp. rate 20, height 5\' 9"  (1.753 m), weight 108.9 kg, SpO2 98 %.  PHYSICAL EXAMINATION:   Physical Exam  GENERAL:  77 y.o.-year-old obese patient lying in bed in no acute distress.  EYES: Pupils equal, round, reactive to light and accommodation. No scleral icterus. Extraocular muscles intact.  HEENT: Head atraumatic, normocephalic. Oropharynx and nasopharynx clear.  NECK:  Supple, no jugular venous distention. No thyroid enlargement, no tenderness.  LUNGS: Normal breath sounds bilaterally, no wheezing, rales, rhonchi. No use of accessory muscles of respiration.  CARDIOVASCULAR: S1, S2 normal. No murmurs, rubs, or gallops.  ABDOMEN: Soft, nontender, nondistended. Bowel sounds present. No organomegaly or mass.  EXTREMITIES: No cyanosis, clubbing or edema b/l.    NEUROLOGIC: Cranial nerves II through XII are intact. No focal Motor or sensory deficits b/l.   PSYCHIATRIC: The patient is alert and oriented x 3.  SKIN: No obvious rash, lesion, or ulcer.    LABORATORY PANEL:   CBC Recent Labs  Lab 04/24/18 1756  WBC 5.3  HGB 11.6*  HCT 34.6*  PLT 105*   ------------------------------------------------------------------------------------------------------------------  Chemistries  Recent Labs  Lab 04/24/18 1756  NA 139  K 3.9  CL 104  CO2 27  GLUCOSE 66*  BUN 11  CREATININE 0.58*  CALCIUM 9.1  AST 55*  ALT 16  ALKPHOS 73  BILITOT 3.3*   ------------------------------------------------------------------------------------------------------------------  Cardiac Enzymes Recent Labs  Lab 04/24/18 1756  TROPONINI 0.03*   ------------------------------------------------------------------------------------------------------------------  RADIOLOGY:  Ct Head Wo Contrast  Result Date: 04/24/2018 CLINICAL DATA:  Delete head trauma EXAM: CT HEAD WITHOUT CONTRAST  TECHNIQUE: Contiguous axial images were obtained from the base of the skull through the vertex without intravenous contrast. COMPARISON:  02/04/2018 head CT FINDINGS: Brain: No hemorrhage or intracranial mass is visualized. Atrophy and small vessel ischemic changes of the white matter. Possible acute to subacute lacunar infarct within the right periventricular white matter, new since 02/04/2018. Stable prominent ventricle size. Chronic lacunar infarcts within the bilateral basal ganglia. Vascular: No hyperdense vessels. Vertebral and carotid vascular calcification Skull: Normal. Negative for fracture or focal lesion. Sinuses/Orbits: Mucosal opacification of the right ethmoid sinuses. No acute orbital abnormality. Other: None IMPRESSION: 1. Small focal hypodensity within the right periventricular white matter, may reflect possible acute or subacute lacunar infarct. This is new as compared with 02/04/2018. 2. Atrophy with small vessel ischemic changes of the white matter. Electronically Signed   By: Donavan Foil M.D.   On: 04/24/2018 19:52   Ct Hip Right Wo Contrast  Result Date: 04/25/2018 CLINICAL DATA:  Right hip pain EXAM: CT OF THE RIGHT HIP WITHOUT CONTRAST TECHNIQUE: Multidetector CT imaging of the right hip was performed according to the standard protocol. Multiplanar CT image reconstructions were also generated. COMPARISON:  None. FINDINGS: Bones/Joint/Cartilage Small cystic lesion along the anterior junction of the humeral head and neck compatible with synovial herniation pit. No hip joint effusion. Mild spurring of the acetabulum and right femoral head. Mild irregularity of the articulation of the sacrococcygeal junction for example on image 110/5, probably incidental, less likely due to an old fracture. Given the lack of significant presacral edema I am skeptical that this is acute. There is mild spurring at the sacroiliac joint. Mild degenerative articular cartilage thinning in the right hip.  Ligaments Suboptimally assessed by CT. Muscles and Tendons There is an apparent lipoma interposed between the gluteus maximus and the piriformis and quadratus femoris muscles which may have some mild mass effect on the sciatic nerve both at and distal to the sciatic notch. Soft tissues There is low-level edema in the rectosigmoid mesentery on image 2/95, cause uncertain. Several sigmoid colon diverticula are present. There is some abnormal but nonspecific fluid along the right lower paracolic gutter anteriorly. Calcification of the vas deferens noted. Small direct and indirect right inguinal hernias contain adipose tissue. Fatty left spermatic cord seen on the lower images. IMPRESSION: 1. No fracture or acute bony abnormality is observed. 2. 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 This appears to be flattening the piriformis muscle and may be leading to some sciatic notch impingement for example on image 37/11. 3. Mild degenerative chondral thinning in the right hip. 4. Small amount of free pelvic fluid along the anterior right paracolic gutter, abnormal but nonspecific. There is also some low-level edema in the rectosigmoid mesentery, nonspecific. Several scattered sigmoid colon diverticula are present. 5. Small direct and indirect right inguinal hernias containing adipose tissues. Electronically Signed   By: Van Clines M.D.   On: 04/25/2018 10:02   US Carotid Bilateral (at [facility] Only)  Result Date: 04/25/2018 CLINICAL DATA:  Expressive aphasia EXAM: BILATERAL CAROTID DUPLEX ULTRASOUND TECHNIQUE: Pearline Cables scale imaging, color Doppler and duplex ultrasound were performed of bilateral carotid and vertebral arteries in the neck. COMPARISON:  None. FINDINGS: Criteria: Quantification of carotid stenosis is based on velocity parameters that correlate the residual internal carotid diameter with NASCET-based stenosis levels, using the diameter of the distal internal carotid  lumen as the denominator for stenosis measurement. The  following velocity measurements were obtained: RIGHT ICA: 98 cm/sec CCA: 78 cm/sec SYSTOLIC ICA/CCA RATIO:  1.3 ECA: 112 cm/sec LEFT ICA: 116 cm/sec CCA: 563 cm/sec SYSTOLIC ICA/CCA RATIO:  1.1 ECA: 96 cm/sec RIGHT CAROTID ARTERY: Moderate irregular calcified plaque in the bulb. Low resistance internal carotid Doppler pattern. RIGHT VERTEBRAL ARTERY:  Antegrade. LEFT CAROTID ARTERY: Little if any plaque in the bulb. Low resistance internal carotid Doppler pattern. LEFT VERTEBRAL ARTERY:  Antegrade. IMPRESSION: Less than 50% stenosis in the right and left internal carotid arteries. Electronically Signed   By: Marybelle Killings M.D.   On: 04/25/2018 09:51     ASSESSMENT AND PLAN:   77 year old male with past medical history of bipolar disorder, dementia, diabetes, diabetic neuropathy, history of Bell's palsy, previous TIA, chronic anemia who presents to the hospital due to recurrent falls and noted to have a acute/subacute CVA.  1.  Acute/subacute lacunar infarct-this was noted on the CT scan of the head on admission.  Was located in the right periventricular area. - Cannot get MRI given patient's pacemaker.  Continue aspirin, statin, carotid duplex negative for acute pathology.  Await echocardiogram results. -Seen by PT/OT and they recommend short-term rehab and patient and patient's son in agreement.  2.  Right hip pain-secondary to the recurrent falls. -Patient had a CT of the right hip yesterday showing no acute fracture.  Continue supportive care.  3.  Diabetes type 2 with neuropathy-A1c is 9. And it's uncontrolled.  - Cont. U500 insulin but it's been reduced to 50 units BID as per Diabetes coordinator  4. Hx of Gout -continue allopurinol, no acute attack.  5.  Neuropathy-continue gabapentin.  6.  Anxiety-continue Xanax as needed.  7.  History of bipolar disorder-continue Latuda, Lamictal.  8.  History of essential hypertension-continue  losartan.  9.  GERD-continue Protonix.  Discussed with social work and patient will likely need short-term rehab.  Patient prefers [facility].   All the records are reviewed and case discussed with Care Management/Social Worker. Management plans discussed with the patient, family and they are in agreement.  CODE STATUS: DNR  DVT Prophylaxis: Lovenox  TOTAL TIME TAKING CARE OF THIS PATIENT: 30 minutes.   POSSIBLE D/C IN 1-2 DAYS, DEPENDING ON CLINICAL CONDITION.   Henreitta Leber M.D on 04/26/2018 at 2:25 PM  Between 7am to 6pm - Pager - [phone]  After 6pm go to [email or web address] - Proofreader  [facility]  Office  [phone]  CC: Primary care physician; Idelle Crouch, MD

## 2018-04-26 NOTE — Progress Notes (Signed)
Physical Therapy Treatment Patient Details Name: [name] Day MRN: [id number] DOB: [birth year] Today's Date: 04/26/2018    History of Present Illness Pt. is a 77 year old male with a PMH of frequent falls, L sided Bells Palsy (started 3 months ago with dental work), DM and gait abnormalities who was admitted to [facility] for falls. Pt. reports that he has had 4 falls in 3 days. 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 The patient reports that he did have a history of a prior stroke 2 years ago which did affect his balance and he has had PT. He was supposed to start outpatient PT recently for diabetic neuropathy/balance issues.     PT Comments    Pt agreeable to PT; denies pain in R hip. Pt progressing mobility. Requires Min guard and cues for safe STS transfers specifically with sit to ensure backing up fully to seat (pt attempted to sit too far from chair) and proper use of hands to prevent falling during transfers. Progressed ambulation distance (90' x 2). Quality is mildly unsteady requiring Min a several times to maintain balance. LLE weak/unsteady at knee during gait. Pt also tends to maintain body position to the L in rolling walker with LLE hitting or stepping outside of the walker parameter; corrected throughout walks. Strengthening exercises education provided to pt/family with focus on eccentric control of quadriceps. Recommendation continues skilled nursing facility to improve balance, quality of ambulation, safety with transfers to decrease risk for falls.   Follow Up Recommendations  SNF     Equipment Recommendations  Rolling walker with 5" wheels(rollator would be too unsteady at this time)    Recommendations for Other Services       Precautions / Restrictions Precautions Precautions: Fall Restrictions Weight Bearing Restrictions: No Other Position/Activity Restrictions: Chair alarm placed and activated and NSG updated.  Son  [name] in room as well.    Mobility  Bed Mobility               General bed mobility comments: Not tested; up in chair  Transfers Overall transfer level: Needs assistance Equipment used: Rolling walker (2 wheeled) Transfers: Sit to/from Stand Sit to Stand: Min guard         General transfer comment: cues for hand placement with sit and proper chair approach (being close enough to chair)  Ambulation/Gait Ambulation/Gait assistance: Min guard;Min assist Gait Distance (Feet): 90 Feet(performed twice) Assistive device: Rolling walker (2 wheeled) Gait Pattern/deviations: Step-through pattern;Wide base of support Gait velocity: decreased Gait velocity interpretation: <1.8 ft/sec, indicate of risk for recurrent falls General Gait Details: No pain R hip; LLE noted to be weak/unstable at knee. Encouraged QS with L weight bearing. LLE maintained close to L leg of rw with times hitting or stepping outside of rw parameter; cues for correction given throughout. MIld unsteadiness throughout due to LLE weakness, decreased fluid gait and rhythmic coordination throughout gait. Several small LOB episodes requiring Min A to correct/maintain balance   Stairs             Wheelchair Mobility    Modified Rankin (Stroke Patients Only)       Balance Overall balance assessment: Needs assistance Sitting-balance support: Feet supported;Bilateral upper extremity supported Sitting balance-Leahy Scale: Good     Standing balance support: Bilateral upper extremity supported Standing balance-Leahy Scale: Fair  Cognition Arousal/Alertness: Awake/alert Behavior During Therapy: WFL for tasks assessed/performed;Restless;Anxious Overall Cognitive Status: Within Functional Limits for tasks assessed                                 General Comments: Hx of cognitive impairments per son [name]'s report and recalls facts of medical hx but  not in order.        Exercises General Exercises - Lower Extremity Quad Sets: Strengthening;Both;20 reps;Seated;Standing Long Arc Quad: Strengthening;Left;10 reps;Seated(focus on hold and eccentric control, 2 sets)    General Comments        Pertinent Vitals/Pain Pain Assessment: No/denies pain Pain Score: 2  Pain Location: R hip Pain Descriptors / Indicators: Aching;Discomfort Pain Intervention(s): Limited activity within patient's tolerance;Monitored during session    Home Living Family/patient expects to be discharged to:: Private residence Living Arrangements: Non-relatives/Friends   Type of Home: House     Home Layout: Two level Home Equipment: Walker - 4 wheels;Grab bars - tub/shower;Grab bars - toilet Additional Comments: Pt. reports that he has been sleeping downstairs in a chair for the past 2 years.     Prior Function Level of Independence: Independent with assistive device(s)      Comments: Due to R hip pain and leaning to the R when upright and walking, his mobility recently has declined.    PT Goals (current goals can now be found in the care plan section) Acute Rehab PT Goals Patient Stated Goal: To go to rehab to get stronger, work on R hip and balance Progress towards PT goals: Progressing toward goals    Frequency    7X/week      PT Plan Current plan remains appropriate    Co-evaluation              AM-PAC PT "6 Clicks" Daily Activity  Outcome Measure  Difficulty turning over in bed (including adjusting bedclothes, sheets and blankets)?: A Little Difficulty moving from lying on back to sitting on the side of the bed? : A Little Difficulty sitting down on and standing up from a chair with arms (e.g., wheelchair, bedside commode, etc,.)?: Unable Help needed moving to and from a bed to chair (including a wheelchair)?: A Little Help needed walking in hospital room?: A Little Help needed climbing 3-5 steps with a railing? : A Lot 6 Click  Score: 15    End of Session Equipment Utilized During Treatment: Gait belt Activity Tolerance: Patient limited by fatigue;Patient tolerated treatment well Patient left: in chair;with call bell/phone within reach;with chair alarm set;with family/visitor present   PT Visit Diagnosis: Unsteadiness on feet (R26.81);Other abnormalities of gait and mobility (R26.89);Repeated falls (R29.6);Muscle weakness (generalized) (M62.81);History of falling (Z91.81);Pain Pain - Right/Left: Right Pain - part of body: Hip     Time: 0737-1062 PT Time Calculation (min) (ACUTE ONLY): 35 min  Charges:  $Gait Training: 8-22 mins $Therapeutic Exercise: 8-22 mins                      Larae Grooms, PTA 04/26/2018, 1:05 PM

## 2018-04-27 ENCOUNTER — Encounter
Admission: RE | Admit: 2018-04-27 | Discharge: 2018-04-27 | Disposition: A | Discharge status: Home / Self Care | Payer: Medicare Other | Source: Ambulatory Visit | Attending: Internal Medicine | Admitting: Internal Medicine

## 2018-04-27 LAB — GLUCOSE, CAPILLARY
GLUCOSE-CAPILLARY: 128 mg/dL — AB (ref 70–99)
Glucose-Capillary: 126 mg/dL — ABNORMAL HIGH (ref 70–99)
Glucose-Capillary: 102 mg/dL — ABNORMAL HIGH (ref 70–99)

## 2018-04-27 MED ORDER — TAMSULOSIN HCL 0.4 MG PO CAPS: 0.4000 mg | ORAL_CAPSULE | Freq: Every day | ORAL | Status: DC

## 2018-04-27 MED ORDER — LOSARTAN POTASSIUM 50 MG PO TABS: 50.0000 mg | ORAL_TABLET | Freq: Every day | ORAL | Status: DC

## 2018-04-27 MED ORDER — ALPRAZOLAM 0.5 MG PO TABS: 0.5000 mg | ORAL_TABLET | Freq: Every evening | ORAL | 0 refills | Status: DC | PRN

## 2018-04-27 MED ORDER — OMEPRAZOLE 40 MG PO CPDR: 40.0000 mg | DELAYED_RELEASE_CAPSULE | Freq: Every day | ORAL | Status: DC

## 2018-04-27 MED ORDER — PRAVASTATIN SODIUM 40 MG PO TABS: 40.0000 mg | ORAL_TABLET | Freq: Every day | ORAL | Status: DC

## 2018-04-27 MED ORDER — ASPIRIN 325 MG PO TABS: 325.0000 mg | ORAL_TABLET | Freq: Every day | ORAL | Status: DC

## 2018-04-27 MED ORDER — INSULIN REGULAR HUMAN (CONC) 500 UNIT/ML ~~LOC~~ SOLN: 50.0000 [IU] | Freq: Two times a day (BID) | SUBCUTANEOUS | 0 refills | Status: DC

## 2018-04-27 NOTE — Clinical Social Work Note (Signed)
Patient is medically ready for discharge today. CSW notified patient of discharge to [facility] today. CSW also notified Lovena Le at [facility] of discharge today. Patient will be transported by EMS. RN to call report and call for transport.   [place], [place]

## 2018-04-27 NOTE — Care Management Important Message (Signed)
Important Message  Patient Details  Name: [name] Day MRN: [id number] Date of Birth: 1941-07-11   Medicare Important Message Given:  Yes    [name] Day 04/27/2018, 11:08 AM

## 2018-04-27 NOTE — Progress Notes (Signed)
Physical Therapy Treatment Patient Details Name: [name] Day MRN: [id number] DOB: Dec 30, 1940 Today's Date: 04/27/2018    History of Present Illness Pt. is a 77 year old male with a PMH of frequent falls, L sided Bells Palsy (started 3 months ago with dental work), DM and gait abnormalities who was admitted to [facility] for falls. Pt. reports that he has had 4 falls in 3 days. 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 The patient reports that he did have a history of a prior stroke 2 years ago which did affect his balance and he has had PT. He was supposed to start outpatient PT recently for diabetic neuropathy/balance issues.     PT Comments    Pt in bed, ready to get up.  To edge of bed with min a x 1.  Difficulty initially gaining balance but once up able to sit without assist.  Stood with min guard x 1.  Once up, he was able to ambulate 18' with walker and generally unsteady gait with min a x 1 and recliner follow for safety.  Gait quality deteriorated with fatigue.  Pt was instructed to sit for pt and staff safety due to gait quality and forward lean.  After rest, he attempted again but was unable to safely step.  Upon return to room, he requested to walk into bathroom to void.  Discussed that he was unable to safely walk in hallway and I was not comfortable attempting gait around bed into bathroom.  Offered urinal and commode.  He attempted on commode but was unable to void.  Remained in recliner.  Discussed mobility with nurse tech.  Pt may be able to walk to BR after rest but should have +2 assist for safety if attempted.    Follow Up Recommendations  SNF     Equipment Recommendations       Recommendations for Other Services       Precautions / Restrictions Precautions Precautions: Fall Restrictions Weight Bearing Restrictions: No Other Position/Activity Restrictions: high fall risk    Mobility  Bed Mobility Overal bed mobility:  Needs Assistance Bed Mobility: Supine to Sit     Supine to sit: Min assist        Transfers Overall transfer level: Needs assistance Equipment used: Rolling walker (2 wheeled) Transfers: Sit to/from Stand Sit to Stand: Min assist;+2 safety/equipment         General transfer comment: cues for hand placement  Ambulation/Gait Ambulation/Gait assistance: Min assist;+2 safety/equipment Gait Distance (Feet): 90 Feet Assistive device: Rolling walker (2 wheeled)   Gait velocity: decreased   General Gait Details: verbal cues to stay up inside walker and general safety.  Poor self awareness of limitiations, fatigue and safety   Stairs             Wheelchair Mobility    Modified Rankin (Stroke Patients Only)       Balance Overall balance assessment: Needs assistance Sitting-balance support: Feet supported;Bilateral upper extremity supported Sitting balance-Leahy Scale: Fair     Standing balance support: Bilateral upper extremity supported Standing balance-Leahy Scale: Poor                              Cognition Arousal/Alertness: Awake/alert Behavior During Therapy: WFL for tasks assessed/performed Overall Cognitive Status: Within Functional Limits for tasks assessed  General Comments: Very talkative and difficult to remain on tasks at times.      Exercises Other Exercises Other Exercises: transfer to commode at bedside - min guard    General Comments        Pertinent Vitals/Pain Pain Assessment: Faces Faces Pain Scale: Hurts a little bit Pain Location: R hip Pain Descriptors / Indicators: Aching;Discomfort Pain Intervention(s): Limited activity within patient's tolerance;Monitored during session    Home Living                      Prior Function            PT Goals (current goals can now be found in the care plan section) Progress towards PT goals: Progressing toward goals     Frequency    7X/week      PT Plan Current plan remains appropriate    Co-evaluation              AM-PAC PT "6 Clicks" Daily Activity  Outcome Measure  Difficulty turning over in bed (including adjusting bedclothes, sheets and blankets)?: A Little Difficulty moving from lying on back to sitting on the side of the bed? : A Little Difficulty sitting down on and standing up from a chair with arms (e.g., wheelchair, bedside commode, etc,.)?: Unable Help needed moving to and from a bed to chair (including a wheelchair)?: A Little Help needed walking in hospital room?: A Little Help needed climbing 3-5 steps with a railing? : A Lot 6 Click Score: 15    End of Session Equipment Utilized During Treatment: Gait belt Activity Tolerance: Patient limited by fatigue;Other (comment) Patient left: in chair;with chair alarm set;with call bell/phone within reach Nurse Communication: Mobility status Pain - Right/Left: Right Pain - part of body: Hip     Time: 9371-6967 PT Time Calculation (min) (ACUTE ONLY): 23 min  Charges:  $Gait Training: 8-22 mins $Therapeutic Activity: 8-22 mins                     Chesley Noon, PTA 04/27/18, 10:17 AM

## 2018-04-27 NOTE — Discharge Summary (Signed)
[facility] [place] NAME: [name] Day    MR#:  [id number]  DATE OF BIRTH:  [birth year]  DATE OF ADMISSION:  04/24/2018 ADMITTING PHYSICIAN: Lance Coon, MD  DATE OF DISCHARGE: No discharge date for patient encounter.  PRIMARY CARE PHYSICIAN: Idelle Crouch, MD    ADMISSION DIAGNOSIS:  Fall, initial encounter [W19.XXXA] Cerebrovascular accident (CVA), unspecified mechanism ([place]) [I63.9]  DISCHARGE DIAGNOSIS:  Principal Problem:   Expressive aphasia Active Problems:   Diabetes ([place])   Anxiety   Hyperlipidemia   Benign essential hypertension   Left-sided Bell's palsy   Frequent falls   SECONDARY DIAGNOSIS:   Past Medical History:  Diagnosis Date  . Anemia   . Bipolar 1 disorder ([place])   . Carotid artery occlusion   . Dementia   . Diabetes mellitus without complication ([place])   . Diabetic peripheral neuropathy ([place]) 03/03/2018  . Gait abnormality 02/12/2017  . Left-sided Bell's palsy 03/03/2018  . Memory disorder 02/12/2017  . TIA (transient ischemic attack)     HOSPITAL COURSE:   77 year old male with past medical history of bipolar disorder, dementia, diabetes, diabetic neuropathy, history of Bell's palsy, previous TIA, chronic anemia who presents to the hospital due to recurrent falls and noted to have a acute/subacute CVA.  1.  Acute/subacute lacunar infarct-this was noted on the CT scan of the head on admission.  Was located in the right periventricular area. - Cannot get MRI given patient's pacemaker.    His carotid duplex was negative for acute pathology, echocardiogram showed normal ejection fraction with no evidence of thrombus. -Patient was seen by neurology and the recommended continuing aspirin, high-dose intensity statin.  Patient was not compliant with his aspirin prior to coming to the hospital.  Patient was seen by physical therapy and Occupational Therapy and the recommended short-term rehab and patient does  want to go to instead of home.  Will discharge to short-term rehab today.  2.  Right hip pain-secondary to the recurrent falls. -Patient had a CT of the right hip yesterday showing no acute fracture.  Continue supportive care pain control and physical therapy as tolerated.  3.  Diabetes type 2 with neuropathy-A1c is 9. And it's uncontrolled.  -Patient was seen by diabetes coordinator and will be discharged on his U5 100 insulin at 50 units twice daily.  Further changes to his diabetic regimen can be done by his primary care physician as an outpatient.  4. Hx of Gout - pt. Will continue allopurinol, no acute attack.  5.  Neuropathy- pt. Will continue gabapentin.  6.  Anxiety- pt. Will continue Xanax as needed.  7.  History of bipolar disorder- pt. Will continue Latuda, Lamictal.  8.  History of essential hypertension- pt. Will continue losartan.  9.  GERD- pt. Will continue Omeprazole.   DISCHARGE CONDITIONS:   Stable  CONSULTS OBTAINED:  Treatment Team:  Leotis Pain, MD  DRUG ALLERGIES:   Allergies  Allergen Reactions  . Lisinopril Other (See Comments)    REACTION: Cough  . Penicillins Other (See Comments)    Reaction: unknown Has patient had a PCN reaction causing immediate rash, facial/tongue/throat swelling, SOB or lightheadedness with hypotension: Unknown Has patient had a PCN reaction causing severe rash involving mucus membranes or skin necrosis: Unknown Has patient had a PCN reaction that required hospitalization: Unknown Has patient had a PCN reaction occurring within the last 10 years: Unknown If all of the above answers are "NO", then may proceed with  Cephalosporin use.   . Clindamycin/Lincomycin Rash and Other (See Comments)    "turns me purple" "turns me orange"    DISCHARGE MEDICATIONS:   Allergies as of 04/27/2018      Reactions   Lisinopril Other (See Comments)   REACTION: Cough   Penicillins Other (See Comments)   Reaction: unknown Has  patient had a PCN reaction causing immediate rash, facial/tongue/throat swelling, SOB or lightheadedness with hypotension: Unknown Has patient had a PCN reaction causing severe rash involving mucus membranes or skin necrosis: Unknown Has patient had a PCN reaction that required hospitalization: Unknown Has patient had a PCN reaction occurring within the last 10 years: Unknown If all of the above answers are "NO", then may proceed with Cephalosporin use.   Clindamycin/lincomycin Rash, Other (See Comments)   "turns me purple" "turns me orange"      Medication List    TAKE these medications   allopurinol 300 MG tablet Commonly known as:  ZYLOPRIM Take 300 mg by mouth daily as needed (gout).   ALPRAZolam 0.5 MG tablet Commonly known as:  XANAX Take 1 tablet (0.5 mg total) by mouth at bedtime as needed for anxiety.   AMBULATORY NON FORMULARY MEDICATION BiPAP machine @ 15/12 DX: OSA DX Code:   amphetamine-dextroamphetamine 20 MG tablet Commonly known as:  ADDERALL Take 1 tablet by mouth daily.   aspirin 325 MG tablet Take 1 tablet (325 mg total) by mouth daily.   clotrimazole-betamethasone cream Commonly known as:  LOTRISONE Apply topically 2 (two) times daily. Apply to effected area   desonide 0.05 % cream Commonly known as:  DESOWEN Apply 1 application topically 2 (two) times daily.   FREESTYLE LITE test strip Generic drug:  glucose blood 1 each by Other route as directed.   gabapentin 100 MG capsule Commonly known as:  NEURONTIN Take 200 mg by mouth 3 (three) times daily.   ibuprofen 200 MG tablet Commonly known as:  ADVIL,MOTRIN Take 400 mg by mouth every 6 (six) hours as needed for mild pain.   insulin regular human CONCENTRATED 500 UNIT/ML injection Commonly known as:  HUMULIN R Inject 0.1 mLs (50 Units total) into the skin 2 (two) times daily with a meal. What changed:  how much to take   lamoTRIgine 50 MG Tbdp Take 50 mg by mouth 2 (two) times daily.    LATUDA 60 MG Tabs Generic drug:  Lurasidone HCl Take 60 mg by mouth daily.   omeprazole 40 MG capsule Commonly known as:  PRILOSEC Take 1 capsule (40 mg total) by mouth daily. What changed:    how much to take  how to take this  when to take this   pravastatin 40 MG tablet Commonly known as:  PRAVACHOL Take 1 tablet (40 mg total) by mouth daily. What changed:    medication strength  how much to take   tamsulosin 0.4 MG Caps capsule Commonly known as:  FLOMAX Take 1 capsule (0.4 mg total) by mouth daily. What changed:    how much to take  how to take this  when to take this         DISCHARGE INSTRUCTIONS:   DIET:  Cardiac diet and Diabetic diet  DISCHARGE CONDITION:  Stable  ACTIVITY:  Activity as tolerated  OXYGEN:  Home Oxygen: No.   Oxygen Delivery: room air  DISCHARGE LOCATION:  nursing home   If you experience worsening of your admission symptoms, develop shortness of breath, life threatening emergency, suicidal or homicidal thoughts you  must seek medical attention immediately by calling 911 or calling your MD immediately  if symptoms less severe.  You Must read complete instructions/literature along with all the possible adverse reactions/side effects for all the Medicines you take and that have been prescribed to you. Take any new Medicines after you have completely understood and accpet all the possible adverse reactions/side effects.   Please note  You were cared for by a hospitalist during your hospital stay. If you have any questions about your discharge medications or the care you received while you were in the hospital after you are discharged, you can call the unit and asked to speak with the hospitalist on call if the hospitalist that took care of you is not available. Once you are discharged, your primary care physician will handle any further medical issues. Please note that NO REFILLS for any discharge medications will be authorized  once you are discharged, as it is imperative that you return to your primary care physician (or establish a relationship with a primary care physician if you do not have one) for your aftercare needs so that they can reassess your need for medications and monitor your lab values.     Today    VITAL SIGNS:  Blood pressure 128/76, pulse 62, temperature 98.3 F (36.8 C), temperature source Oral, resp. rate 18, height 5\' 9"  (1.753 m), weight 108.9 kg, SpO2 97 %.  I/O:    Intake/Output Summary (Last 24 hours) at 04/27/2018 1135 Last data filed at 04/26/2018 1822 Gross per 24 hour  Intake 360 ml  Output -  Net 360 ml    PHYSICAL EXAMINATION:  GENERAL:  77 y.o.-year-old patient lying in the bed with no acute distress.  EYES: Pupils equal, round, reactive to light and accommodation. No scleral icterus. Extraocular muscles intact.  HEENT: Head atraumatic, normocephalic. Oropharynx and nasopharynx clear.  NECK:  Supple, no jugular venous distention. No thyroid enlargement, no tenderness.  LUNGS: Normal breath sounds bilaterally, no wheezing, rales,rhonchi. No use of accessory muscles of respiration.  CARDIOVASCULAR: S1, S2 normal. No murmurs, rubs, or gallops.  ABDOMEN: Soft, non-tender, non-distended. Bowel sounds present. No organomegaly or mass.  EXTREMITIES: No pedal edema, cyanosis, or clubbing.  NEUROLOGIC: Cranial nerves II through XII are intact. No focal motor or sensory defecits b/l.  PSYCHIATRIC: The patient is alert and oriented x 3. Good affect.  SKIN: No obvious rash, lesion, or ulcer.   DATA REVIEW:   CBC Recent Labs  Lab 04/24/18 1756  WBC 5.3  HGB 11.6*  HCT 34.6*  PLT 105*    Chemistries  Recent Labs  Lab 04/24/18 1756  NA 139  K 3.9  CL 104  CO2 27  GLUCOSE 66*  BUN 11  CREATININE 0.58*  CALCIUM 9.1  AST 55*  ALT 16  ALKPHOS 73  BILITOT 3.3*    Cardiac Enzymes Recent Labs  Lab 04/24/18 1756  TROPONINI 0.03*    Microbiology Results   Results for orders placed or performed in visit on 11/05/16  Microscopic Examination     Status: Abnormal   Collection Time: 11/05/16  3:14 PM  Result Value Ref Range Status   WBC, UA 0-5 0 - 5 /hpf Final   RBC, UA 3-10 (A) 0 - 2 /hpf Final   Epithelial Cells (non renal) 0-10 0 - 10 /hpf Final   Bacteria, UA Few None seen/Few Final    RADIOLOGY:  No results found.    Management plans discussed with the patient, family and  they are in agreement.  CODE STATUS:     Code Status Orders  (From admission, onward)         Start     Ordered   04/25/18 1127  Do not attempt resuscitation (DNR)  Continuous    Question Answer Comment  In the event of cardiac or respiratory ARREST Do not call a "code blue"   In the event of cardiac or respiratory ARREST Do not perform Intubation, CPR, defibrillation or ACLS   In the event of cardiac or respiratory ARREST Use medication by any route, position, wound care, and other measures to relive pain and suffering. May use oxygen, suction and manual treatment of airway obstruction as needed for comfort.      04/25/18 1126          TOTAL TIME TAKING CARE OF THIS PATIENT: 40 minutes.    Henreitta Leber M.D on 04/27/2018 at 11:35 AM  Between 7am to 6pm - Pager - [phone]  After 6pm go to [email or web address] - Proofreader  [facility]  Office  [phone]  CC: Primary care physician; Idelle Crouch, MD

## 2018-04-27 NOTE — NC FL2 (Signed)
[place] LEVEL OF CARE SCREENING TOOL     IDENTIFICATION  Patient Name: [name] Day Birthdate: [birth year] Sex: male Admission Date (Current Location): 04/24/2018  [place] and [place] Number:  Engineering geologist and Address:  [facility], [address], [place], [place] [address]      Provider Number: [id number]  Attending Physician Name and Address:  Henreitta Leber, MD  Relative Name and Phone Number:  Margarito Dehaas- son [phone]    Current Level of Care: Hospital Recommended Level of Care: [place] Prior Approval Number:    Date Approved/Denied:   PASRR Number:    Discharge Plan: SNF    Current Diagnoses: Patient Active Problem List   Diagnosis Date Noted  . Frequent falls 04/24/2018  . Expressive aphasia 04/24/2018  . Left-sided Bell's palsy 03/03/2018  . Diabetic peripheral neuropathy ([place]) 03/03/2018  . Abnormal LFTs 09/08/2017  . 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 Diabetes mellitus type 2, uncomplicated ([place]) 76/73/4193  . ED (erectile dysfunction) 09/08/2017  . Environmental allergies 09/08/2017  . Nephrolithiasis 09/08/2017  . Non-cardiac chest pain 09/08/2017  . Rectal bleeding 09/08/2017  . Sinusitis 09/08/2017  . Varicose veins of leg with swelling, bilateral 06/12/2017  . Venous ulcer ([place]) 05/18/2017  . Chronic venous insufficiency 05/18/2017  . Memory disorder 02/12/2017  . Gait abnormality 02/12/2017  . CKD (chronic kidney disease), stage III ([place]) 02/06/2017  . Hyperlipidemia 02/06/2017  . Swelling of limb 02/06/2017  . Lymphedema 02/06/2017  . Heme positive stool 12/04/2016  . Mixed dementia 07/29/2016  . Cardiac pacemaker in situ 04/27/2016  . AV heart block 04/24/2016  . Hyperglycemia 04/24/2016  . Generalized weakness 04/24/2016  . Syncope and collapse 04/24/2016  . Metal bone fixation hardware in place 04/21/2016  . Bilateral carotid artery stenosis 02/05/2015   . OSA (obstructive sleep apnea) 02/05/2015  . Benign essential hypertension 01/19/2015  . B12 deficiency 05/23/2014  . Morbid obesity with alveolar hypoventilation ([place]) 05/23/2014  . Long-term insulin use ([place]) 05/22/2014  . Type II diabetes mellitus with manifestations, uncontrolled ([place]) 05/22/2014  . Aortic stenosis, moderate 11/14/2013  . Diabetes ([place]) 06/22/2007  . ANEMIA-NOS 03/29/2007  . Anxiety 03/29/2007  . DEPRESSION 03/29/2007  . ALLERGIC RHINITIS 03/29/2007  . ASTHMA 03/29/2007  . OSTEOARTHRITIS 03/29/2007  . NEPHROLITHIASIS, HX OF 03/29/2007    Orientation RESPIRATION BLADDER Height & Weight     Self, Time, Place  Normal Continent Weight: 240 lb (108.9 kg) Height:  5\' 9"  (175.3 cm)  BEHAVIORAL SYMPTOMS/MOOD NEUROLOGICAL BOWEL NUTRITION STATUS  (none) (none) Continent Diet(Carb Modified )  AMBULATORY STATUS COMMUNICATION OF NEEDS Skin   Extensive Assist Verbally Normal                       Personal Care Assistance Level of Assistance  Bathing, Feeding, Dressing Bathing Assistance: Limited assistance Feeding assistance: Independent Dressing Assistance: Limited assistance     Functional Limitations Info  Sight, Hearing, Speech Sight Info: Adequate Hearing Info: Adequate Speech Info: Adequate    SPECIAL CARE FACTORS FREQUENCY  PT (By licensed PT), OT (By licensed OT)     PT Frequency: 5 OT Frequency: 5            Contractures Contractures Info: Not present    Additional Factors Info  Code Status, Allergies Code Status Info: DNR Allergies Info: Lisinopril, Penicillins, Clindamycin/lincomycin           Current Medications (04/27/2018):  This is the current  hospital active medication list Current Facility-Administered Medications  Medication Dose Route Frequency Provider Last Rate Last Dose  . acetaminophen (TYLENOL) tablet 650 mg  650 mg Oral Q4H PRN Lance Coon, MD   650 mg at 04/25/18 2144   Or  . acetaminophen (TYLENOL) solution  650 mg  650 mg Per Tube Q4H PRN Lance Coon, MD       Or  . acetaminophen (TYLENOL) suppository 650 mg  650 mg Rectal Q4H PRN Lance Coon, MD      . allopurinol (ZYLOPRIM) tablet 300 mg  300 mg Oral Daily PRN Henreitta Leber, MD      . ALPRAZolam Duanne Moron) tablet 0.5 mg  0.5 mg Oral QHS PRN Lance Coon, MD      . amphetamine-dextroamphetamine (ADDERALL) tablet 20 mg  20 mg Oral Daily Bettey Costa, MD   20 mg at 04/27/18 0852  . aspirin EC tablet 325 mg  325 mg Oral Daily Lance Coon, MD   325 mg at 04/27/18 0853  . enoxaparin (LOVENOX) injection 40 mg  40 mg Subcutaneous Q24H Lance Coon, MD   40 mg at 04/26/18 2122  . gabapentin (NEURONTIN) capsule 200 mg  200 mg Oral TID Bettey Costa, MD   200 mg at 04/26/18 2120  . insulin aspart (novoLOG) injection 0-5 Units  0-5 Units Subcutaneous QHS Lance Coon, MD   2 Units at 04/25/18 2143  . insulin aspart (novoLOG) injection 0-9 Units  0-9 Units Subcutaneous TID WC Lance Coon, MD   1 Units at 04/27/18 [phone]  . insulin regular human CONCENTRATED (HUMULIN R) 500 UNIT/ML kw[place]pen 50 Units  50 Units Subcutaneous BID WC Henreitta Leber, MD   50 Units at 04/27/18 0855  . lamoTRIgine (LAMICTAL) tablet 50 mg  50 mg Oral BID Henreitta Leber, MD   50 mg at 04/26/18 2121  . losartan (COZAAR) tablet 50 mg  50 mg Oral Daily Lance Coon, MD   50 mg at 04/27/18 0853  . lurasidone (LATUDA) tablet 60 mg  60 mg Oral Daily Bettey Costa, MD   60 mg at 04/26/18 1053  . multivitamin with minerals tablet 1 tablet  1 tablet Oral Daily Henreitta Leber, MD   1 tablet at 04/27/18 [phone]  . pantoprazole (PROTONIX) EC tablet 40 mg  40 mg Oral Daily Lance Coon, MD   40 mg at 04/27/18 0853  . pravastatin (PRAVACHOL) tablet 40 mg  40 mg Oral Daily Henreitta Leber, MD   40 mg at 04/26/18 1050  . protein supplement (PREMIER PROTEIN) liquid - approved for s/p bariatric surgery  11 oz Oral BID BM Sainani, Belia Heman, MD      . tamsulosin (FLOMAX) capsule 0.4 mg  0.4 mg Oral  QPC breakfast Lance Coon, MD   0.4 mg at 04/27/18 5409     Discharge Medications: Please see discharge summary for a list of discharge medications.  Relevant Imaging Results:  Relevant Lab Results:   Additional Information SSN [id number]  Annamaria Boots, [place]

## 2018-04-27 NOTE — Clinical Social Work Note (Signed)
Clinical Social Work Assessment  Patient Details  Name: [name] Day MRN: [id number] Date of Birth: 02-Jul-1941  Date of referral:  04/27/18               Reason for consult:  Facility Placement                Permission sought to share information with:  Case Manager, Customer service manager, Family Supports Permission granted to share information::  Yes, Verbal Permission Granted  Name::        Agency::     Relationship::     Contact Information:     Housing/Transportation Living arrangements for the past 2 months:  Single Family Home Source of Information:  Patient Patient Interpreter Needed:  None Criminal Activity/Legal Involvement Pertinent to Current Situation/Hospitalization:  No - Comment as needed Significant Relationships:  Adult Children, Other Family Members Lives with:  Self Do you feel safe going back to the place where you live?  Yes Need for family participation in patient care:  Yes (Comment)  Care giving concerns:  Patient lives alone    Social Worker assessment / plan:  CSW consulted for facility placement. CSW met with patient to discuss discharge plan. Patient states that he lives alone but has a lot of family support. He reports that he knows he needs to go to SNF for rehab before returning home. Patient would like to go to [facility] for rehab. CSW explained bed search process and patient is in agreement. CSW initiated bed search.   [facility] is able to offer a bed. CSW gave patient bed offers and he chose [facility] CSW notified Lovena Le at [facility] of bed acceptance. CSW will continue to follow for discharge plannning.   Employment status:  Retired Forensic scientist:  [employer] PT Recommendations:  [place] / Referral to community resources:  [place]  Patient/Family's Response to care:  Patient thanked CSW for assistance   Patient/Family's Understanding of and Emotional Response  to Diagnosis, Current Treatment, and Prognosis:  Patient understands current discharge plan   Emotional Assessment Appearance:  Appears stated age Attitude/Demeanor/Rapport:    Affect (typically observed):  Accepting, Happy, Hopeful Orientation:  Oriented to Self, Oriented to Place, Oriented to  Time, Oriented to Situation Alcohol / Substance use:  Not Applicable Psych involvement (Current and /or in the community):  No (Comment)  Discharge Needs  Concerns to be addressed:  Discharge Planning Concerns Readmission within the last 30 days:  No Current discharge risk:  None Barriers to Discharge:  No Barriers Identified   Annamaria Boots, [place] 04/27/2018, 11:24 AM

## 2018-04-27 NOTE — Plan of Care (Signed)
  Problem: Education: Goal: Knowledge of secondary prevention will improve Outcome: Adequate for Discharge Goal: Knowledge of patient specific risk factors addressed and post discharge goals established will improve Outcome: Adequate for Discharge Goal: Individualized Educational Video(s) Outcome: Adequate for Discharge   Problem: Education: Goal: Knowledge of General Education information will improve Description Including pain rating scale, medication(s)/side effects and non-pharmacologic comfort measures Outcome: Adequate for Discharge   Problem: Health Behavior/Discharge Planning: Goal: Ability to manage health-related needs will improve Outcome: Adequate for Discharge   Problem: Clinical Measurements: Goal: Ability to maintain clinical measurements within normal limits will improve Outcome: Adequate for Discharge Goal: Will remain free from infection Outcome: Adequate for Discharge Goal: Diagnostic test results will improve Outcome: Adequate for Discharge Goal: Respiratory complications will improve Outcome: Adequate for Discharge Goal: Cardiovascular complication will be avoided Outcome: Adequate for Discharge   Problem: Activity: Goal: Risk for activity intolerance will decrease Outcome: Adequate for Discharge   Problem: Nutrition: Goal: Adequate nutrition will be maintained Outcome: Adequate for Discharge   Problem: Coping: Goal: Level of anxiety will decrease Outcome: Adequate for Discharge   Problem: Elimination: Goal: Will not experience complications related to bowel motility Outcome: Adequate for Discharge Goal: Will not experience complications related to urinary retention Outcome: Adequate for Discharge   Problem: Pain Managment: Goal: General experience of comfort will improve Outcome: Adequate for Discharge   Problem: Safety: Goal: Ability to remain free from injury will improve Outcome: Adequate for Discharge   Problem: Skin Integrity: Goal:  Risk for impaired skin integrity will decrease Outcome: Adequate for Discharge

## 2018-04-27 NOTE — Progress Notes (Signed)
SLP Cancellation Note  Patient Details Name: [name] Day MRN: [id number] DOB: Jan 26, 1941   Cancelled treatment:       Reason Eval/Treat Not Completed: SLP screened, no needs identified, will sign off(chart reviewed; consulted NSG then met w/ pt/family/Son). Pt denied any difficulty swallowing and is currently on a regular diet; tolerates swallowing pills w/ water per NSG. Son reported same stating pt ate breakfast meal w/out difficulty.  Pt conversed at conversational level w/out significant deficits noted; pt does have min Left facial decreased tone and slight reduced articulation for certain speech sounds/phonemes(laterals) d/t BASELINE Bell's Palsy. Pt is 100% intelligible during conversation. Pt talks frequently. Son also reported pt exhibits BASELINE "fogginess" sometimes in the early morning when he first awakens but then this clears and "he is good" - this has been pt's presentation over the past 2 years d/t the multiple strokes. Family denied any new cognitive-linguistic deficits.  No further skilled ST services indicated as pt appears at his baseline. Pt/family agreed stating they were eager to get to Rehab to work on "my balance and endurance" in order to d/c home. NSG to reconsult if any change in status while admitted.     Orinda Kenner, MS, CCC-SLP Charlcie Prisco 04/27/2018, 2:44 PM

## 2018-04-27 NOTE — Clinical Social Work Placement (Signed)
   CLINICAL SOCIAL WORK PLACEMENT  NOTE  Date:  04/27/2018  Patient Details  Name: [name] Day MRN: [id number] Date of Birth: 11-28-1940  Clinical Social Work is seeking post-discharge placement for this patient at the [place] level of care (*CSW will initial, date and re-position this form in  chart as items are completed):  Yes   Patient/family provided with [facility] Work Department's list of facilities offering this level of care within the geographic area requested by the patient (or if unable, by the patient's family).  Yes   Patient/family informed of their freedom to choose among providers that offer the needed level of care, that participate in Medicare, Medicaid or managed care program needed by the patient, have an available bed and are willing to accept the patient.  Yes   Patient/family informed of [facility]'s ownership interest in [facility] and [facility], as well as of the fact that they are under no obligation to receive care at these facilities.  PASRR submitted to EDS on 04/27/18     PASRR number received on 04/27/18     Existing PASRR number confirmed on       FL2 transmitted to all facilities in geographic area requested by pt/family on 04/27/18     FL2 transmitted to all facilities within larger geographic area on       Patient informed that his/her managed care company has contracts with or will negotiate with certain facilities, including the following:        Yes   Patient/family informed of bed offers received.  Patient chooses bed at [facility]     Physician recommends and patient chooses bed at [facility])    Patient to be transferred to [facility] on 04/27/18.  Patient to be transferred to facility by (EMS)     Patient family notified on 04/27/18 of transfer.  Name of family member notified:  (Patient )     PHYSICIAN       Additional Comment:     _______________________________________________ Annamaria Boots, [place] 04/27/2018, 11:30 AM

## 2018-04-27 NOTE — Progress Notes (Signed)
Patient discharged to [facility] via EMS transport, report called to Rocky Link, LPN. Education provided to patient/son, and they verbalized understanding.

## 2018-04-28 ENCOUNTER — Other Ambulatory Visit: Payer: Self-pay

## 2018-04-28 DIAGNOSIS — M25551 Pain in right hip: Secondary | ICD-10-CM | POA: Insufficient documentation

## 2018-04-28 MED ORDER — AMPHETAMINE-DEXTROAMPHETAMINE 20 MG PO TABS: 20.0000 mg | ORAL_TABLET | Freq: Every day | ORAL | 0 refills | Status: DC

## 2018-04-28 MED ORDER — ALPRAZOLAM 0.5 MG PO TABS: 0.5000 mg | ORAL_TABLET | Freq: Every evening | ORAL | 0 refills | Status: DC | PRN

## 2018-04-28 NOTE — Telephone Encounter (Signed)
Rx sent to [facility] phone : [phone] , fax : [phone]  

## 2018-05-02 ENCOUNTER — Encounter
Admission: RE | Admit: 2018-05-02 | Discharge: 2018-05-02 | Disposition: A | Discharge status: Home / Self Care | Payer: Medicare Other | Source: Ambulatory Visit | Attending: Internal Medicine | Admitting: Internal Medicine

## 2018-05-05 ENCOUNTER — Encounter: Payer: Self-pay | Admitting: Adult Health

## 2018-05-05 ENCOUNTER — Ambulatory Visit: Payer: Medicare Other

## 2018-05-05 ENCOUNTER — Non-Acute Institutional Stay (SKILLED_NURSING_FACILITY): Payer: Medicare Other | Admitting: Adult Health

## 2018-05-05 DIAGNOSIS — G309 Alzheimer's disease, unspecified: Secondary | ICD-10-CM

## 2018-05-05 DIAGNOSIS — K219 Gastro-esophageal reflux disease without esophagitis: Secondary | ICD-10-CM

## 2018-05-05 DIAGNOSIS — E785 Hyperlipidemia, unspecified: Secondary | ICD-10-CM

## 2018-05-05 DIAGNOSIS — E1165 Type 2 diabetes mellitus with hyperglycemia: Secondary | ICD-10-CM

## 2018-05-05 DIAGNOSIS — F0280 Dementia in other diseases classified elsewhere without behavioral disturbance: Secondary | ICD-10-CM

## 2018-05-05 DIAGNOSIS — F0150 Vascular dementia without behavioral disturbance: Secondary | ICD-10-CM

## 2018-05-05 DIAGNOSIS — N183 Chronic kidney disease, stage 3 unspecified: Secondary | ICD-10-CM

## 2018-05-05 DIAGNOSIS — E1169 Type 2 diabetes mellitus with other specified complication: Secondary | ICD-10-CM

## 2018-05-05 DIAGNOSIS — F319 Bipolar disorder, unspecified: Secondary | ICD-10-CM

## 2018-05-05 DIAGNOSIS — E1122 Type 2 diabetes mellitus with diabetic chronic kidney disease: Secondary | ICD-10-CM

## 2018-05-05 DIAGNOSIS — E118 Type 2 diabetes mellitus with unspecified complications: Secondary | ICD-10-CM | POA: Diagnosis not present

## 2018-05-05 DIAGNOSIS — E1142 Type 2 diabetes mellitus with diabetic polyneuropathy: Secondary | ICD-10-CM

## 2018-05-05 DIAGNOSIS — R400 Somnolence: Secondary | ICD-10-CM

## 2018-05-05 DIAGNOSIS — IMO0002 Reserved for concepts with insufficient information to code with codable children: Secondary | ICD-10-CM

## 2018-05-05 DIAGNOSIS — G4733 Obstructive sleep apnea (adult) (pediatric): Secondary | ICD-10-CM | POA: Diagnosis not present

## 2018-05-05 DIAGNOSIS — M1611 Unilateral primary osteoarthritis, right hip: Secondary | ICD-10-CM

## 2018-05-05 NOTE — Progress Notes (Signed)
Location:   [place] Room Number: 210A Place of Service:  SNF (31)   CODE STATUS: DNR  Allergies  Allergen Reactions  . Lisinopril Other (See Comments)    REACTION: Cough  . Penicillins Other (See Comments)    Reaction: unknown Has patient had a PCN reaction causing immediate rash, facial/tongue/throat swelling, SOB or lightheadedness with hypotension: Unknown Has patient had a PCN reaction causing severe rash involving mucus membranes or skin necrosis: Unknown Has patient had a PCN reaction that required hospitalization: Unknown Has patient had a PCN reaction occurring within the last 10 years: Unknown If all of the above answers are "NO", then may proceed with Cephalosporin use.   . Clindamycin/Lincomycin Rash and Other (See Comments)    "turns me purple" "turns me orange"    Chief Complaint  Patient presents with  . Medical Management of Chronic Issues    Dementia; OSA: diabetes dyslipidemia. Weekly follow up for the first 30 days post hospitalization     HPI:  He is a 77 year old short term rehab patient being seen for the management of his chronic illnesses: dementia; OSA diabetes; dyslipidemia. He has had cognition testing done which demonstrates moderate dementia. He is willing to take namenda to help maintain his current level functioning. We did discuss his elevated cbg readings; he is willing to start lantus. He denies any uncontrolled pain and denies any changes in appetite.   Past Medical History:  Diagnosis Date  . Anemia   . Bipolar 1 disorder ([place])   . Carotid artery occlusion   . Dementia   . Diabetes mellitus without complication ([place])   . Diabetic peripheral neuropathy ([place]) 03/03/2018  . Gait abnormality 02/12/2017  . Left-sided Bell's palsy 03/03/2018  . Memory disorder 02/12/2017  . TIA (transient ischemic attack)     Past Surgical History:  Procedure Laterality Date  . COLONOSCOPY WITH PROPOFOL N/A 02/25/2017   Procedure:  COLONOSCOPY WITH PROPOFOL;  Surgeon: Manya Silvas, MD;  Location: [facility] ENDOSCOPY;  Service: Endoscopy;  Laterality: N/A;  . ESOPHAGOGASTRODUODENOSCOPY (EGD) WITH PROPOFOL N/A 02/25/2017   Procedure: ESOPHAGOGASTRODUODENOSCOPY (EGD) WITH PROPOFOL;  Surgeon: Manya Silvas, MD;  Location: [facility] ENDOSCOPY;  Service: Endoscopy;  Laterality: N/A;  . NASAL SINUS SURGERY    . PACEMAKER PLACEMENT    . TONSILLECTOMY    . TONSILLECTOMY      Social History   Socioeconomic History  . Marital status: Widowed    Spouse name: Not on file  . Number of children: 2  . Years of education: 70  . Highest education level: Not on file  Occupational History  . Occupation: Retired  Scientific laboratory technician  . Financial resource strain: Not on file  . Food insecurity:    Worry: Never true    Inability: Never true  . Transportation needs:    Medical: No    Non-medical: No  Tobacco Use  . Smoking status: Former Smoker    Packs/day: 1.00    Types: Cigarettes, Cigars  . Smokeless tobacco: Never Used  Substance and Sexual Activity  . Alcohol use: Yes    Comment: occasional  . Drug use: No  . Sexual activity: Not Currently  Lifestyle  . Physical activity:    Days per week: 1 day    Minutes per session: 10 min  . Stress: Not at all  Relationships  . Social connections:    Talks on phone: Patient refused    Gets together: Patient refused  Attends religious service: Patient refused    Active member of club or organization: Patient refused    Attends meetings of clubs or organizations: Patient refused    Relationship status: Patient refused  . Intimate partner violence:    Fear of current or ex partner: Patient refused    Emotionally abused: Patient refused    Physically abused: Patient refused    Forced sexual activity: Patient refused  Other Topics Concern  . Not on file  Social History Narrative   Lives with [name]   Caffeine use: caffeine free soda daily   Right handed   Family  History  Problem Relation Age of Onset  . Allergies Mother   . Diabetes Father   . Diabetes Sister       VITAL SIGNS BP (!) 140/51   Pulse 74   Temp 98.2 F (36.8 C) (Oral)   Resp 16   Ht 5\' 9"  (1.753 m)   Wt 258 lb 4.8 oz (117.2 kg)   SpO2 97%   BMI 38.14 kg/m   Outpatient Encounter Medications as of 05/05/2018  Medication Sig  . allopurinol (ZYLOPRIM) 300 MG tablet Take 300 mg by mouth daily as needed (gout).   . ALPRAZolam (XANAX) 0.5 MG tablet Take 1 tablet (0.5 mg total) by mouth at bedtime as needed for anxiety.  . AMBULATORY NON FORMULARY MEDICATION BiPAP machine @ 15/12 DX: OSA DX Code:  . amphetamine-dextroamphetamine (ADDERALL) 20 MG tablet Take 1 tablet (20 mg total) by mouth daily.  Marland Kitchen aspirin 325 MG tablet Take 1 tablet (325 mg total) by mouth daily.  . clotrimazole-betamethasone (LOTRISONE) cream Apply topically 2 (two) times daily. Apply to effected area  . desonide (DESOWEN) 0.05 % cream Apply 1 application topically 2 (two) times daily.   . diclofenac sodium (VOLTAREN) 1 % GEL Apply 4 g topically 4 (four) times daily. to the right lateral hip  . FREESTYLE LITE test strip 1 each by Other route as directed.   . gabapentin (NEURONTIN) 100 MG capsule Take 200 mg by mouth 3 (three) times daily.   Marland Kitchen ibuprofen (ADVIL,MOTRIN) 200 MG tablet Take 400 mg by mouth every 6 (six) hours as needed for mild pain.   Marland Kitchen insulin regular human CONCENTRATED (HUMULIN R) 500 UNIT/ML injection Inject 0.1 mLs (50 Units total) into the skin 2 (two) times daily with a meal.  . lamoTRIgine (LAMICTAL) 25 MG tablet Take 50 mg by mouth 2 (two) times daily.  . Lurasidone HCl (LATUDA) 60 MG TABS Take 60 mg by mouth daily.   . magnesium hydroxide (MILK OF MAGNESIA) 400 MG/5ML suspension Take 30 mLs by mouth every 4 (four) hours as needed for mild constipation. Constipation/ no BM for 2 days  . NON FORMULARY Diet Type: NCS, NAS  . omeprazole (PRILOSEC) 40 MG capsule Take 1 capsule (40 mg total) by  mouth daily.  . pravastatin (PRAVACHOL) 40 MG tablet Take 40 mg by mouth at bedtime.  . tamsulosin (FLOMAX) 0.4 MG CAPS capsule Take 1 capsule (0.4 mg total) by mouth daily.  . [DISCONTINUED] lamoTRIgine 50 MG TBDP Take 50 mg by mouth 2 (two) times daily.  . [DISCONTINUED] losartan (COZAAR) 50 MG tablet Take 1 tablet (50 mg total) by mouth daily.  . [DISCONTINUED] pravastatin (PRAVACHOL) 40 MG tablet Take 1 tablet (40 mg total) by mouth daily.   No facility-administered encounter medications on file as of 05/05/2018.      SIGNIFICANT DIAGNOSTIC EXAMS  LABS REVIEWED; TODAY:   04-25-18: hgb  a1c 9.1; chol 152; ldl 96; trig 103; hdl 35   Review of Systems  Constitutional: Negative for malaise/fatigue.  Respiratory: Negative for cough and shortness of breath.   Cardiovascular: Negative for chest pain, palpitations and leg swelling.  Gastrointestinal: Negative for abdominal pain, constipation and heartburn.  Musculoskeletal: Negative for back pain, joint pain and myalgias.  Skin: Negative.   Neurological: Negative for dizziness.  Psychiatric/Behavioral: The patient is not nervous/anxious.     Physical Exam  Constitutional: He is oriented to person, place, and time. He appears well-developed and well-nourished. No distress.  Obese   Neck: No thyromegaly present.  Cardiovascular: Normal rate, regular rhythm, normal heart sounds and intact distal pulses.  Pulmonary/Chest: Effort normal and breath sounds normal. No respiratory distress.  Abdominal: Soft. Bowel sounds are normal. He exhibits no distension. There is no tenderness.  Musculoskeletal: He exhibits no edema.  Is able to move all extremities   Lymphadenopathy:    He has no cervical adenopathy.  Neurological: He is alert and oriented to person, place, and time.  15/30 cognition test   Skin: Skin is warm and dry. He is not diaphoretic.  Psychiatric: He has a normal mood and affect.     ASSESSMENT/ PLAN:  TODAY:   1. 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 Obstructive sleep apnea: is stable uses CPAP nightly   3. Type II diabetes mellitus with manifestations uncontrolled: cbgs are all elevated: hgb a1c 9.1; will continue asa 325 mg daily  humulin R 50 units twice daily and will begin lantus 10 units nightly   4.  Dyslipidemia associated with type 2 diabetes mellitus: is stable LDL 96 will continue pravachol 40 mg daily   5. GERD without esophagitis: is stable will continue prilosec 40 mg daily  6.  Diabetic peripheral neuropathy: is stable will continue neurontin 200 mg three times daily   7. Primary right hip osteoarthritis: is stable will continue voltaren gel 4 gm four times daily to right hip   8. CKD stage 3 due to type 2 diabetes mellitus: stable will monitor   9.  Bipolar 1 disorder: is stable will continue lamictal 50 mg twice daily latuda 60 mg daily xanax 0.5 mg nightly as needed   10. Somnolence: is stable will continue adderall 20 mg daily   11. BPH: is stable will continue flomax 0.4 mg daily    MD is aware of resident's narcotic use and is in agreement with current plan of care. We will attempt to wean resident as apropriate   Ok Edwards NP [facility]  Contact [phone] Monday through Friday 8am- 5pm  After hours call [phone]

## 2018-05-11 ENCOUNTER — Encounter: Payer: Self-pay | Admitting: Podiatry

## 2018-05-11 ENCOUNTER — Ambulatory Visit (INDEPENDENT_AMBULATORY_CARE_PROVIDER_SITE_OTHER): Payer: Medicare Other | Admitting: Podiatry

## 2018-05-11 DIAGNOSIS — M79676 Pain in unspecified toe(s): Secondary | ICD-10-CM

## 2018-05-11 DIAGNOSIS — E0843 Diabetes mellitus due to underlying condition with diabetic autonomic (poly)neuropathy: Secondary | ICD-10-CM

## 2018-05-11 DIAGNOSIS — B351 Tinea unguium: Secondary | ICD-10-CM | POA: Diagnosis not present

## 2018-05-12 ENCOUNTER — Non-Acute Institutional Stay (SKILLED_NURSING_FACILITY): Payer: Medicare Other | Admitting: Adult Health

## 2018-05-12 ENCOUNTER — Other Ambulatory Visit: Payer: Self-pay

## 2018-05-12 ENCOUNTER — Encounter: Payer: Self-pay | Admitting: Adult Health

## 2018-05-12 ENCOUNTER — Ambulatory Visit: Payer: Medicare Other

## 2018-05-12 DIAGNOSIS — G300 Alzheimer's disease with early onset: Secondary | ICD-10-CM

## 2018-05-12 DIAGNOSIS — N138 Other obstructive and reflux uropathy: Secondary | ICD-10-CM

## 2018-05-12 DIAGNOSIS — F419 Anxiety disorder, unspecified: Secondary | ICD-10-CM

## 2018-05-12 DIAGNOSIS — M1A9XX0 Chronic gout, unspecified, without tophus (tophi): Secondary | ICD-10-CM | POA: Diagnosis not present

## 2018-05-12 DIAGNOSIS — N401 Enlarged prostate with lower urinary tract symptoms: Secondary | ICD-10-CM

## 2018-05-12 DIAGNOSIS — Z794 Long term (current) use of insulin: Secondary | ICD-10-CM

## 2018-05-12 DIAGNOSIS — E1140 Type 2 diabetes mellitus with diabetic neuropathy, unspecified: Secondary | ICD-10-CM | POA: Diagnosis not present

## 2018-05-12 DIAGNOSIS — K219 Gastro-esophageal reflux disease without esophagitis: Secondary | ICD-10-CM

## 2018-05-12 DIAGNOSIS — F909 Attention-deficit hyperactivity disorder, unspecified type: Secondary | ICD-10-CM

## 2018-05-12 DIAGNOSIS — F0280 Dementia in other diseases classified elsewhere without behavioral disturbance: Secondary | ICD-10-CM

## 2018-05-12 DIAGNOSIS — F319 Bipolar disorder, unspecified: Secondary | ICD-10-CM | POA: Insufficient documentation

## 2018-05-12 DIAGNOSIS — I639 Cerebral infarction, unspecified: Secondary | ICD-10-CM

## 2018-05-12 DIAGNOSIS — M1611 Unilateral primary osteoarthritis, right hip: Secondary | ICD-10-CM

## 2018-05-12 MED ORDER — DICLOFENAC SODIUM 1 % TD GEL: 4.0000 g | Freq: Four times a day (QID) | TRANSDERMAL | 0 refills | Status: DC

## 2018-05-12 MED ORDER — CLOTRIMAZOLE-BETAMETHASONE 1-0.05 % EX CREA: TOPICAL_CREAM | Freq: Two times a day (BID) | CUTANEOUS | 0 refills | Status: DC

## 2018-05-12 MED ORDER — OMEPRAZOLE 40 MG PO CPDR: 40.0000 mg | DELAYED_RELEASE_CAPSULE | Freq: Every day | ORAL | 0 refills | Status: DC

## 2018-05-12 MED ORDER — TAMSULOSIN HCL 0.4 MG PO CAPS: 0.4000 mg | ORAL_CAPSULE | Freq: Every day | ORAL | 0 refills | Status: DC

## 2018-05-12 MED ORDER — ALLOPURINOL 300 MG PO TABS: 300.0000 mg | ORAL_TABLET | Freq: Every day | ORAL | 0 refills | Status: AC | PRN

## 2018-05-12 MED ORDER — PRAVASTATIN SODIUM 40 MG PO TABS: 40.0000 mg | ORAL_TABLET | Freq: Every day | ORAL | 0 refills | Status: DC

## 2018-05-12 MED ORDER — INSULIN REGULAR HUMAN (CONC) 500 UNIT/ML ~~LOC~~ SOLN: 50.0000 [IU] | Freq: Two times a day (BID) | SUBCUTANEOUS | 0 refills | Status: DC

## 2018-05-12 MED ORDER — DESONIDE 0.05 % EX CREA: 1.0000 "application " | TOPICAL_CREAM | Freq: Two times a day (BID) | CUTANEOUS | 0 refills | Status: DC

## 2018-05-12 MED ORDER — GABAPENTIN 100 MG PO CAPS: 200.0000 mg | ORAL_CAPSULE | Freq: Three times a day (TID) | ORAL | 0 refills | Status: AC

## 2018-05-12 MED ORDER — LAMOTRIGINE 25 MG PO TABS: 50.0000 mg | ORAL_TABLET | Freq: Two times a day (BID) | ORAL | 0 refills | Status: AC

## 2018-05-12 MED ORDER — FREESTYLE LITE TEST VI STRP: 1.0000 | ORAL_STRIP | 0 refills | Status: AC

## 2018-05-12 MED ORDER — LURASIDONE HCL 60 MG PO TABS: 60.0000 mg | ORAL_TABLET | Freq: Every day | ORAL | 0 refills | Status: AC

## 2018-05-12 NOTE — Telephone Encounter (Signed)
Patient will receive prescription upon discharge 05/14/2018 for Adderall 20 mg tab 1 tab po qd 30 tabs and Alprazolam 0.5 mg 1 tab po qhs prn 30 tabs.

## 2018-05-12 NOTE — Progress Notes (Signed)
Location:  [facility] Room Number: [place] of Service:  SNF (31) Provider:  Durenda Age, NP  Patient Care Team: Idelle Crouch, MD as PCP - General (Internal Medicine)  Extended Emergency Contact Information Primary Emergency Contact: [place] of [place] Phone: [phone] Relation: Son Secondary Emergency Contact: [name] Mobile Phone: [phone] Relation: Friend  Code Status:  DNR  Goals of care: Advanced Directive information Advanced Directives 05/12/2018  Does Patient Have a Medical Advance Directive? Yes  Type of Advance Directive Out of facility DNR (pink MOST or yellow form)  Does patient want to make changes to medical advance directive? No - Patient declined  Copy of [place] in Chart? -  Would patient like information on creating a medical advance directive? -  Pre-existing out of facility DNR order (yellow form or pink MOST form) Yellow form placed in chart (order not valid for inpatient use)     Chief Complaint  Patient presents with  . Discharge Note    Discharging on 05/14/2018    HPI:  Pt is a 77 y.o. male seen today for a discharge visit  05/14/2018 with Home health  PT, OT and Nursing.  He has been admitted to [facility] on 04/[facility]/18 from a recent hospitalization for an acute/subacute lacunar infarct. CT scan of the head showed lacunar infarct located on the right periventricular area. MRI could not be taken due to pacemaker. He was seen by neurology and recommend aspirin and high-dose intensity statin. He had recurrent falls at home and was having right hip pain.CT of the right hip showed no acute fracture.  Patient was admitted to this facility for short-term rehabilitation after the patient's recent hospitalization.  Patient has completed SNF rehabilitation and therapy has cleared the patient for discharge.   Past Medical History:    Diagnosis Date  . Anemia   . Bipolar 1 disorder ([place])   . Carotid artery occlusion   . Dementia   . Diabetes mellitus without complication ([place])   . Diabetic peripheral neuropathy ([place]) 03/03/2018  . Gait abnormality 02/12/2017  . Left-sided Bell's palsy 03/03/2018  . Memory disorder 02/12/2017  . TIA (transient ischemic attack)    Past Surgical History:  Procedure Laterality Date  . COLONOSCOPY WITH PROPOFOL N/A 02/25/2017   Procedure: COLONOSCOPY WITH PROPOFOL;  Surgeon: Manya Silvas, MD;  Location: [facility] ENDOSCOPY;  Service: Endoscopy;  Laterality: N/A;  . ESOPHAGOGASTRODUODENOSCOPY (EGD) WITH PROPOFOL N/A 02/25/2017   Procedure: ESOPHAGOGASTRODUODENOSCOPY (EGD) WITH PROPOFOL;  Surgeon: Manya Silvas, MD;  Location: [facility] ENDOSCOPY;  Service: Endoscopy;  Laterality: N/A;  . NASAL SINUS SURGERY    . PACEMAKER PLACEMENT    . TONSILLECTOMY    . TONSILLECTOMY      Allergies  Allergen Reactions  . Lisinopril Other (See Comments)    REACTION: Cough  . Penicillins Other (See Comments)    Reaction: unknown Has patient had a PCN reaction causing immediate rash, facial/tongue/throat swelling, SOB or lightheadedness with hypotension: Unknown Has patient had a PCN reaction causing severe rash involving mucus membranes or skin necrosis: Unknown Has patient had a PCN reaction that required hospitalization: Unknown Has patient had a PCN reaction occurring within the last 10 years: Unknown If all of the above answers are "NO", then may proceed with Cephalosporin use.   . Clindamycin/Lincomycin Rash and Other (See Comments)    "turns me purple" "turns me orange"    Outpatient Encounter Medications as  of 05/12/2018  Medication Sig  . allopurinol (ZYLOPRIM) 300 MG tablet Take 300 mg by mouth daily as needed (gout).   . ALPRAZolam (XANAX) 0.5 MG tablet Take 1 tablet (0.5 mg total) by mouth at bedtime as needed for anxiety.  . AMBULATORY NON FORMULARY MEDICATION BiPAP machine @ 15/12 DX:  OSA DX Code:  . amphetamine-dextroamphetamine (ADDERALL) 20 MG tablet Take 1 tablet (20 mg total) by mouth daily.  Marland Kitchen aspirin 325 MG tablet Take 1 tablet (325 mg total) by mouth daily.  . clotrimazole-betamethasone (LOTRISONE) cream Apply topically 2 (two) times daily. Apply to effected area  . desonide (DESOWEN) 0.05 % cream Apply 1 application topically 2 (two) times daily.   . diclofenac sodium (VOLTAREN) 1 % GEL Apply 4 g topically 4 (four) times daily. to the right lateral hip  . FREESTYLE LITE test strip 1 each by Other route as directed.   . gabapentin (NEURONTIN) 100 MG capsule Take 200 mg by mouth 3 (three) times daily.   Marland Kitchen ibuprofen (ADVIL,MOTRIN) 200 MG tablet Take 400 mg by mouth every 6 (six) hours as needed for mild pain.   Marland Kitchen insulin regular human CONCENTRATED (HUMULIN R) 500 UNIT/ML injection Inject 0.1 mLs (50 Units total) into the skin 2 (two) times daily with a meal.  . lamoTRIgine (LAMICTAL) 25 MG tablet Take 50 mg by mouth 2 (two) times daily.  . Lurasidone HCl (LATUDA) 60 MG TABS Take 60 mg by mouth daily.   . magnesium hydroxide (MILK OF MAGNESIA) 400 MG/5ML suspension Take 30 mLs by mouth every 4 (four) hours as needed for mild constipation. Constipation/ no BM for 2 days  . memantine (NAMENDA XR) 7 MG CP24 24 hr capsule Take 7 mg by mouth daily.  . NON FORMULARY Diet Type: NCS, NAS  . omeprazole (PRILOSEC) 40 MG capsule Take 1 capsule (40 mg total) by mouth daily.  . pravastatin (PRAVACHOL) 40 MG tablet Take 40 mg by mouth at bedtime.  . tamsulosin (FLOMAX) 0.4 MG CAPS capsule Take 1 capsule (0.4 mg total) by mouth daily.   No facility-administered encounter medications on file as of 05/12/2018.     Review of Systems  GENERAL: No change in appetite, no fatigue, no weight changes, no fever, chills or weakness MOUTH and THROAT: Denies oral discomfort, gingival pain or bleeding, pain from teeth or hoarseness   RESPIRATORY: no cough, SOB, DOE, wheezing,  hemoptysis CARDIAC: No chest pain, edema or palpitations GI: No abdominal pain, diarrhea, constipation, heart burn, nausea or vomiting GU: Denies dysuria, frequency, hematuria, incontinence, or discharge PSYCHIATRIC: Denies feelings of depression or anxiety. No report of hallucinations, insomnia, paranoia, or agitation   Immunization History  Administered Date(s) Administered  . Influenza Split 06/21/2014  . Influenza Whole 06/01/2008  . Influenza-Unspecified 06/17/2017  . Pneumococcal Polysaccharide-23 09/01/2008, 06/21/2014   Pertinent  Health Maintenance Due  Topic Date Due  . FOOT EXAM  07/15/1951  . OPHTHALMOLOGY EXAM  07/15/1951  . URINE MICROALBUMIN  07/15/1951  . PNA vac Low Risk Adult (2 of 2 - PCV13) 06/22/2015  . INFLUENZA VACCINE  04/01/2018  . HEMOGLOBIN A1C  10/26/2018   No flowsheet data found.     Vitals:   05/12/18 1031  BP: (!) 147/58  Pulse: 67  Resp: 20  Temp: 98.5 F (36.9 C)  TempSrc: Oral  SpO2: 97%  Weight: 258 lb 11.2 oz (117.3 kg)  Height: 5\' 9"  (1.753 m)   Body mass index is 38.2 kg/m.  Physical Exam  GENERAL  APPEARANCE: Well nourished. In no acute distress. Obese SKIN:  Skin is warm and dry. MOUTH and THROAT: Lips are without lesions. Oral mucosa is moist and without lesions. Tongue is normal in shape, size, and color and without lesions RESPIRATORY: Breathing is even & unlabored, BS CTAB CARDIAC: RRR, no murmur,no extra heart sounds, no edema GI: Abdomen soft, normal BS, no masses, no tenderness EXTREMITIES:  Able to move X 4 extremities NEUROLOGICAL: There is no tremor. Speech is clear, Left-sided weakness PSYCHIATRIC: Alert and oriented X 3. Affect and behavior are appropriate   Labs reviewed: Recent Labs    02/04/18 2235 04/24/18 1756  NA 136 139  K 3.6 3.9  CL 101 104  CO2 25 27  GLUCOSE 244* 66*  BUN 13 11  CREATININE 0.72 0.58*  CALCIUM 9.2 9.1   Recent Labs    02/04/18 2235 04/24/18 1756  AST 35 55*  ALT 20  16  ALKPHOS 95 73  BILITOT 1.9* 3.3*  PROT 7.0 6.8  ALBUMIN 3.8 4.0   Recent Labs    02/04/18 2235 04/24/18 1756  WBC 5.4 5.3  HGB 12.7* 11.6*  HCT 38.4* 34.6*  MCV 65.7* 68.8*  PLT 106* 105*   Lab Results  Component Value Date   TSH 3.982 02/04/2018   Lab Results  Component Value Date   HGBA1C 9.1 (H) 04/25/2018   Lab Results  Component Value Date   CHOL 152 04/25/2018   HDL 35 (L) 04/25/2018   LDLCALC 96 04/25/2018   TRIG 103 04/25/2018   CHOLHDL 4.3 04/25/2018    Significant Diagnostic Results in last 30 days:  Ct Head Wo Contrast  Result Date: 04/24/2018 CLINICAL DATA:  Delete head trauma EXAM: CT HEAD WITHOUT CONTRAST TECHNIQUE: Contiguous axial images were obtained from the base of the skull through the vertex without intravenous contrast. COMPARISON:  02/04/2018 head CT FINDINGS: Brain: No hemorrhage or intracranial mass is visualized. Atrophy and small vessel ischemic changes of the white matter. Possible acute to subacute lacunar infarct within the right periventricular white matter, new since 02/04/2018. Stable prominent ventricle size. Chronic lacunar infarcts within the bilateral basal ganglia. Vascular: No hyperdense vessels. Vertebral and carotid vascular calcification Skull: Normal. Negative for fracture or focal lesion. Sinuses/Orbits: Mucosal opacification of the right ethmoid sinuses. No acute orbital abnormality. Other: None IMPRESSION: 1. Small focal hypodensity within the right periventricular white matter, may reflect possible acute or subacute lacunar infarct. This is new as compared with 02/04/2018. 2. Atrophy with small vessel ischemic changes of the white matter. Electronically Signed   By: Donavan Foil M.D.   On: 04/24/2018 19:52   Ct Hip Right Wo Contrast  Result Date: 04/25/2018 CLINICAL DATA:  Right hip pain EXAM: CT OF THE RIGHT HIP WITHOUT CONTRAST TECHNIQUE: Multidetector CT imaging of the right hip was performed according to the standard  protocol. Multiplanar CT image reconstructions were also generated. COMPARISON:  None. FINDINGS: Bones/Joint/Cartilage Small cystic lesion along the anterior junction of the humeral head and neck compatible with synovial herniation pit. No hip joint effusion. Mild spurring of the acetabulum and right femoral head. Mild irregularity of the articulation of the sacrococcygeal junction for example on image 110/5, probably incidental, less likely due to an old fracture. Given the lack of significant presacral edema I am skeptical that this is acute. There is mild spurring at the sacroiliac joint. Mild degenerative articular cartilage thinning in the right hip. Ligaments Suboptimally assessed by CT. Muscles and Tendons There is an apparent lipoma  interposed between the gluteus maximus and the piriformis and quadratus femoris muscles which may have some mild mass effect on the sciatic nerve both at and distal to the sciatic notch. Soft tissues There is low-level edema in the rectosigmoid mesentery on image 2/02, cause uncertain. Several sigmoid colon diverticula are present. There is some abnormal but nonspecific fluid along the right lower paracolic gutter anteriorly. Calcification of the vas deferens noted. Small direct and indirect right inguinal hernias contain adipose tissue. Fatty left spermatic cord seen on the lower images. IMPRESSION: 1. No fracture or acute bony abnormality is observed. 2. 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 This appears to be flattening the piriformis muscle and may be leading to some sciatic notch impingement for example on image 37/11. 3. Mild degenerative chondral thinning in the right hip. 4. Small amount of free pelvic fluid along the anterior right paracolic gutter, abnormal but nonspecific. There is also some low-level edema in the rectosigmoid mesentery, nonspecific. Several scattered sigmoid colon diverticula are present. 5. Small direct and  indirect right inguinal hernias containing adipose tissues. Electronically Signed   By: Van Clines M.D.   On: 04/25/2018 10:02   US Carotid Bilateral (at [facility] Only)  Result Date: 04/25/2018 CLINICAL DATA:  Expressive aphasia EXAM: BILATERAL CAROTID DUPLEX ULTRASOUND TECHNIQUE: Pearline Cables scale imaging, color Doppler and duplex ultrasound were performed of bilateral carotid and vertebral arteries in the neck. COMPARISON:  None. FINDINGS: Criteria: Quantification of carotid stenosis is based on velocity parameters that correlate the residual internal carotid diameter with NASCET-based stenosis levels, using the diameter of the distal internal carotid lumen as the denominator for stenosis measurement. The following velocity measurements were obtained: RIGHT ICA: 98 cm/sec CCA: 78 cm/sec SYSTOLIC ICA/CCA RATIO:  1.3 ECA: 112 cm/sec LEFT ICA: 116 cm/sec CCA: 542 cm/sec SYSTOLIC ICA/CCA RATIO:  1.1 ECA: 96 cm/sec RIGHT CAROTID ARTERY: Moderate irregular calcified plaque in the bulb. Low resistance internal carotid Doppler pattern. RIGHT VERTEBRAL ARTERY:  Antegrade. LEFT CAROTID ARTERY: Little if any plaque in the bulb. Low resistance internal carotid Doppler pattern. LEFT VERTEBRAL ARTERY:  Antegrade. IMPRESSION: Less than 50% stenosis in the right and left internal carotid arteries. Electronically Signed   By: Marybelle Killings M.D.   On: 04/25/2018 09:51    Assessment/Plan  1. Cerebrovascular accident (CVA), unspecified mechanism ([place]) - CT scan of the head showed lacunar infarct located on the right periventricular area. MRI could not be taken due to pacemaker. He was seen by neurology and recommend aspirin and high-dose intensity statin, continue ASA 325 mg 1 tab daily, will have Home health PT, OT, for therapeutic strengthening exercises and Nursing for medication management  - pravastatin (PRAVACHOL) 40 MG tablet; Take 1 tablet (40 mg total) by mouth at bedtime.  Dispense: 30 tablet; Refill: 0  2.  Benign prostatic hyperplasia with urinary obstruction - tamsulosin (FLOMAX) 0.4 MG CAPS capsule; Take 1 capsule (0.4 mg total) by mouth daily.  Dispense: 30 capsule; Refill: 0  3. Type 2 diabetes mellitus with diabetic neuropathy, with long-term current use of insulin (HCC) - FREESTYLE LITE test strip; 1 each by Other route as directed.  Dispense: 100 each; Refill: 0 - gabapentin (NEURONTIN) 100 MG capsule; Take 2 capsules (200 mg total) by mouth 3 (three) times daily.  Dispense: 180 capsule; Refill: 0 - insulin regular human CONCENTRATED (HUMULIN R) 500 UNIT/ML injection; Inject 0.1 mLs (50 Units total) into the skin 2 (two) times daily with a meal.  Dispense: 30 mL; Refill: 0 Lab Results  Component Value Date   HGBA1C 9.1 (H) 04/25/2018    4. Chronic gout without tophus, unspecified cause, unspecified site - allopurinol (ZYLOPRIM) 300 MG tablet; Take 1 tablet (300 mg total) by mouth daily as needed (gout).  Dispense: 30 tablet; Refill: 0  5. Bipolar 1 disorder (HCC) - lamoTRIgine (LAMICTAL) 25 MG tablet; Take 2 tablets (50 mg total) by mouth 2 (two) times daily.  Dispense: 180 tablet; Refill: 0 - Lurasidone HCl (LATUDA) 60 MG TABS; Take 1 tablet (60 mg total) by mouth daily.  Dispense: 30 tablet; Refill: 0  6. Gastroesophageal reflux disease without esophagitis - omeprazole (PRILOSEC) 40 MG capsule; Take 1 capsule (40 mg total) by mouth daily.  Dispense: 30 capsule; Refill: 0  7. Primary osteoarthritis of right hip - diclofenac sodium (VOLTAREN) 1 % GEL; Apply 4 g topically 4 (four) times daily. to the right lateral hip  Dispense: 100 g; Refill: 0  8. Early onset Alzheimer's dementia without behavioral disturbance - currently on Namenda XR 7 mg Q D till 9/13 then will have Namenda XR 14 mg 1 capsule Q D X 8 days then will have Namenda XR 21 mg 1 capsule Q D X 8 days then Namenda XR 28 mg 1 capsule PO Q D   9. Anxiety - mood is stable, continue Alprazolam 0.5 mg 1 tab PO Q HS PRN  10.  ADHD -  Will continue Adderall 20 mg 1 tab daily,     I have filled out patient's discharge paperwork and written prescriptions.  Patient will receive home health PT, OT,  And Nursing.  DME provided: None  Total discharge time: Greater than 30 minutes Greater than 50% was spent in counseling and coordination of care.   Discharge time involved coordination of the discharge process with social worker, nursing staff and therapy department. Medical justification for home health services verified.    Durenda Age, NP [facility] [phone] (Monday-Friday 8:00 a.m. - 5:00 p.m.) [phone] (after hours)

## 2018-05-12 NOTE — Progress Notes (Signed)
   SUBJECTIVE Patient with a history of diabetes mellitus presents to office today complaining of elongated, thickened nails that cause pain while ambulating in shoes. He is unable to trim his own nails. Patient is here for further evaluation and treatment.   Past Medical History:  Diagnosis Date  . Anemia   . Bipolar 1 disorder ([place])   . Carotid artery occlusion   . Dementia   . Diabetes mellitus without complication ([place])   . Diabetic peripheral neuropathy ([place]) 03/03/2018  . Gait abnormality 02/12/2017  . Left-sided Bell's palsy 03/03/2018  . Memory disorder 02/12/2017  . TIA (transient ischemic attack)     OBJECTIVE General Patient is awake, alert, and oriented x 3 and in no acute distress. Derm Skin is dry and supple bilateral. Negative open lesions or macerations. Remaining integument unremarkable. Nails are tender, long, thickened and dystrophic with subungual debris, consistent with onychomycosis, 1-5 bilateral. No signs of infection noted. Vasc  DP and PT pedal pulses palpable bilaterally. Temperature gradient within normal limits.  Neuro Epicritic and protective threshold sensation diminished bilaterally.  Musculoskeletal Exam No symptomatic pedal deformities noted bilateral. Muscular strength within normal limits.  ASSESSMENT 1. Diabetes Mellitus w/ peripheral neuropathy 2. Onychomycosis of nail due to dermatophyte bilateral 3. Pain in foot bilateral  PLAN OF CARE 1. Patient evaluated today. 2. Instructed to maintain good pedal hygiene and foot care. Stressed importance of controlling blood sugar.  3. Mechanical debridement of nails 1-5 bilaterally performed using a nail nipper. Filed with dremel without incident.  4. Return to clinic in 3 mos.     Edrick Kins, DPM [facility]  Dr. Edrick Kins, [place]                                        [place], [place] [id number]                Office [phone]  Fax [phone]

## 2018-05-19 ENCOUNTER — Ambulatory Visit: Payer: Medicare Other

## 2018-05-22 DIAGNOSIS — N183 Chronic kidney disease, stage 3 (moderate): Secondary | ICD-10-CM

## 2018-05-22 DIAGNOSIS — IMO0002 Reserved for concepts with insufficient information to code with codable children: Secondary | ICD-10-CM | POA: Insufficient documentation

## 2018-05-22 DIAGNOSIS — E1169 Type 2 diabetes mellitus with other specified complication: Secondary | ICD-10-CM | POA: Insufficient documentation

## 2018-05-22 DIAGNOSIS — K219 Gastro-esophageal reflux disease without esophagitis: Secondary | ICD-10-CM | POA: Insufficient documentation

## 2018-05-22 DIAGNOSIS — E1365 Other specified diabetes mellitus with hyperglycemia: Secondary | ICD-10-CM

## 2018-05-22 DIAGNOSIS — R400 Somnolence: Secondary | ICD-10-CM | POA: Insufficient documentation

## 2018-05-22 DIAGNOSIS — E785 Hyperlipidemia, unspecified: Secondary | ICD-10-CM

## 2018-05-22 DIAGNOSIS — E1322 Other specified diabetes mellitus with diabetic chronic kidney disease: Secondary | ICD-10-CM | POA: Insufficient documentation

## 2018-05-26 ENCOUNTER — Ambulatory Visit: Payer: Medicare Other

## 2018-05-26 ENCOUNTER — Inpatient Hospital Stay
Admission: EM | Admit: 2018-05-26 | Discharge: 2018-06-01 | DRG: 071 | Disposition: A | Discharge status: Skilled Nursing Facility | Payer: Medicare Other | Attending: Family Medicine | Admitting: Family Medicine

## 2018-05-26 ENCOUNTER — Emergency Department: Payer: Medicare Other

## 2018-05-26 ENCOUNTER — Other Ambulatory Visit: Payer: Self-pay

## 2018-05-26 DIAGNOSIS — D649 Anemia, unspecified: Secondary | ICD-10-CM | POA: Diagnosis present

## 2018-05-26 DIAGNOSIS — E1142 Type 2 diabetes mellitus with diabetic polyneuropathy: Secondary | ICD-10-CM | POA: Diagnosis present

## 2018-05-26 DIAGNOSIS — Z888 Allergy status to other drugs, medicaments and biological substances status: Secondary | ICD-10-CM

## 2018-05-26 DIAGNOSIS — R531 Weakness: Secondary | ICD-10-CM

## 2018-05-26 DIAGNOSIS — Z9119 Patient's noncompliance with other medical treatment and regimen: Secondary | ICD-10-CM

## 2018-05-26 DIAGNOSIS — Z950 Presence of cardiac pacemaker: Secondary | ICD-10-CM

## 2018-05-26 DIAGNOSIS — Z794 Long term (current) use of insulin: Secondary | ICD-10-CM

## 2018-05-26 DIAGNOSIS — E7220 Disorder of urea cycle metabolism, unspecified: Secondary | ICD-10-CM | POA: Diagnosis present

## 2018-05-26 DIAGNOSIS — K922 Gastrointestinal hemorrhage, unspecified: Secondary | ICD-10-CM

## 2018-05-26 DIAGNOSIS — I69344 Monoplegia of lower limb following cerebral infarction affecting left non-dominant side: Secondary | ICD-10-CM

## 2018-05-26 DIAGNOSIS — Z880 Allergy status to penicillin: Secondary | ICD-10-CM | POA: Diagnosis not present

## 2018-05-26 DIAGNOSIS — R195 Other fecal abnormalities: Secondary | ICD-10-CM | POA: Diagnosis present

## 2018-05-26 DIAGNOSIS — D563 Thalassemia minor: Secondary | ICD-10-CM | POA: Diagnosis present

## 2018-05-26 DIAGNOSIS — Z23 Encounter for immunization: Secondary | ICD-10-CM | POA: Diagnosis present

## 2018-05-26 DIAGNOSIS — G4733 Obstructive sleep apnea (adult) (pediatric): Secondary | ICD-10-CM | POA: Diagnosis present

## 2018-05-26 DIAGNOSIS — Z79899 Other long term (current) drug therapy: Secondary | ICD-10-CM

## 2018-05-26 DIAGNOSIS — R4781 Slurred speech: Secondary | ICD-10-CM | POA: Diagnosis present

## 2018-05-26 DIAGNOSIS — F0390 Unspecified dementia without behavioral disturbance: Secondary | ICD-10-CM | POA: Diagnosis present

## 2018-05-26 DIAGNOSIS — Z66 Do not resuscitate: Secondary | ICD-10-CM | POA: Diagnosis present

## 2018-05-26 DIAGNOSIS — N183 Chronic kidney disease, stage 3 (moderate): Secondary | ICD-10-CM | POA: Diagnosis present

## 2018-05-26 DIAGNOSIS — G9340 Encephalopathy, unspecified: Secondary | ICD-10-CM | POA: Diagnosis not present

## 2018-05-26 DIAGNOSIS — E1122 Type 2 diabetes mellitus with diabetic chronic kidney disease: Secondary | ICD-10-CM | POA: Diagnosis present

## 2018-05-26 DIAGNOSIS — I129 Hypertensive chronic kidney disease with stage 1 through stage 4 chronic kidney disease, or unspecified chronic kidney disease: Secondary | ICD-10-CM | POA: Diagnosis present

## 2018-05-26 DIAGNOSIS — E1165 Type 2 diabetes mellitus with hyperglycemia: Secondary | ICD-10-CM | POA: Diagnosis present

## 2018-05-26 DIAGNOSIS — D509 Iron deficiency anemia, unspecified: Secondary | ICD-10-CM | POA: Diagnosis present

## 2018-05-26 LAB — URINALYSIS, COMPLETE (UACMP) WITH MICROSCOPIC
BACTERIA UA: NONE SEEN
Bilirubin Urine: NEGATIVE
Hgb urine dipstick: NEGATIVE
Ketones, ur: NEGATIVE mg/dL
Leukocytes, UA: NEGATIVE
NITRITE: NEGATIVE
PROTEIN: NEGATIVE mg/dL
SPECIFIC GRAVITY, URINE: 1.016 (ref 1.005–1.030)
pH: 5 (ref 5.0–8.0)

## 2018-05-26 LAB — COMPREHENSIVE METABOLIC PANEL
ALK PHOS: 80 U/L (ref 38–126)
ALT: 20 U/L (ref 0–44)
ANION GAP: 6 (ref 5–15)
AST: 42 U/L — ABNORMAL HIGH (ref 15–41)
Albumin: 3.3 g/dL — ABNORMAL LOW (ref 3.5–5.0)
BILIRUBIN TOTAL: 2.1 mg/dL — AB (ref 0.3–1.2)
BUN: 14 mg/dL (ref 8–23)
CALCIUM: 8.7 mg/dL — AB (ref 8.9–10.3)
CO2: 26 mmol/L (ref 22–32)
CREATININE: 0.68 mg/dL (ref 0.61–1.24)
Chloride: 105 mmol/L (ref 98–111)
Glucose, Bld: 349 mg/dL — ABNORMAL HIGH (ref 70–99)
Potassium: 4.1 mmol/L (ref 3.5–5.1)
Sodium: 137 mmol/L (ref 135–145)
TOTAL PROTEIN: 5.8 g/dL — AB (ref 6.5–8.1)

## 2018-05-26 LAB — TROPONIN I: Troponin I: <0.03 ng/mL (ref <0.03)

## 2018-05-26 LAB — GLUCOSE, CAPILLARY
GLUCOSE-CAPILLARY: 320 mg/dL — AB (ref 70–99)
GLUCOSE-CAPILLARY: 136 mg/dL — AB (ref 70–99)
Glucose-Capillary: 94 mg/dL (ref 70–99)

## 2018-05-26 LAB — CBC WITH DIFFERENTIAL/PLATELET
Basophils Absolute: 0 10*3/uL (ref 0–0.1)
Basophils Relative: 1 %
EOS ABS: 0.1 10*3/uL (ref 0–0.7)
Eosinophils Relative: 4 %
HEMATOCRIT: 29 % — AB (ref 40.0–52.0)
HEMOGLOBIN: 9.5 g/dL — AB (ref 13.0–18.0)
LYMPHS ABS: 0.9 10*3/uL — AB (ref 1.0–3.6)
LYMPHS PCT: 23 %
MCH: 22.5 pg — AB (ref 26.0–34.0)
MCHC: 32.8 g/dL (ref 32.0–36.0)
MCV: 68.5 fL — AB (ref 80.0–100.0)
MONOS PCT: 12 %
Monocytes Absolute: 0.4 10*3/uL (ref 0.2–1.0)
NEUTROS ABS: 2.2 10*3/uL (ref 1.4–6.5)
Neutrophils Relative %: 60 %
Platelets: 104 10*3/uL — ABNORMAL LOW (ref 150–440)
RBC: 4.23 MIL/uL — AB (ref 4.40–5.90)
RDW: 15.9 % — ABNORMAL HIGH (ref 11.5–14.5)
WBC: 3.6 10*3/uL — AB (ref 3.8–10.6)

## 2018-05-26 LAB — HEMOGLOBIN AND HEMATOCRIT, BLOOD
HEMATOCRIT: 30.3 % — AB (ref 40.0–52.0)
HEMOGLOBIN: 10 g/dL — AB (ref 13.0–18.0)

## 2018-05-26 MED ORDER — ONDANSETRON HCL 4 MG PO TABS: 4.0000 mg | ORAL_TABLET | Freq: Four times a day (QID) | ORAL | Status: DC | PRN

## 2018-05-26 MED ORDER — SODIUM CHLORIDE 0.9 % IV SOLN
INTRAVENOUS | Status: DC
  Administered 2018-05-26 – 2018-05-27 (×3): via INTRAVENOUS

## 2018-05-26 MED ORDER — INFLUENZA VAC SPLIT HIGH-DOSE 0.5 ML IM SUSY
0.5000 mL | PREFILLED_SYRINGE | INTRAMUSCULAR | Status: AC
  Administered 2018-05-27: 0.5 mL via INTRAMUSCULAR
  Filled 2018-05-26: qty 0.5

## 2018-05-26 MED ORDER — AMPHETAMINE-DEXTROAMPHETAMINE 5 MG PO TABS
20.0000 mg | ORAL_TABLET | Freq: Every day | ORAL | Status: DC
Start: 2018-05-27 — End: 2018-05-28
  Administered 2018-05-27 – 2018-05-28 (×2): 20 mg via ORAL
  Filled 2018-05-26 (×2): qty 4

## 2018-05-26 MED ORDER — INSULIN ASPART 100 UNIT/ML ~~LOC~~ SOLN
0.0000 [IU] | Freq: Every day | SUBCUTANEOUS | Status: DC
  Administered 2018-05-28: 3 [IU] via SUBCUTANEOUS
  Administered 2018-05-29: 2 [IU] via SUBCUTANEOUS
  Filled 2018-05-26 (×2): qty 1

## 2018-05-26 MED ORDER — INSULIN ASPART 100 UNIT/ML ~~LOC~~ SOLN
0.0000 [IU] | Freq: Three times a day (TID) | SUBCUTANEOUS | Status: DC
  Administered 2018-05-27: 3 [IU] via SUBCUTANEOUS
  Administered 2018-05-27: 2 [IU] via SUBCUTANEOUS
  Administered 2018-05-27: 3 [IU] via SUBCUTANEOUS
  Administered 2018-05-28 (×2): 5 [IU] via SUBCUTANEOUS
  Administered 2018-05-28: 2 [IU] via SUBCUTANEOUS
  Administered 2018-05-29 (×2): 3 [IU] via SUBCUTANEOUS
  Administered 2018-05-29 – 2018-05-30 (×2): 2 [IU] via SUBCUTANEOUS
  Administered 2018-05-30: 8 [IU] via SUBCUTANEOUS
  Administered 2018-05-30: 3 [IU] via SUBCUTANEOUS
  Administered 2018-05-31: 2 [IU] via SUBCUTANEOUS
  Administered 2018-05-31 (×2): 5 [IU] via SUBCUTANEOUS
  Administered 2018-06-01: 3 [IU] via SUBCUTANEOUS
  Filled 2018-05-26 (×16): qty 1

## 2018-05-26 MED ORDER — PRAVASTATIN SODIUM 40 MG PO TABS
40.0000 mg | ORAL_TABLET | Freq: Every day | ORAL | Status: DC
  Administered 2018-05-26 – 2018-05-27 (×2): 40 mg via ORAL
  Filled 2018-05-26 (×2): qty 2
  Filled 2018-05-26 (×3): qty 1

## 2018-05-26 MED ORDER — FAMOTIDINE IN NACL 20-0.9 MG/50ML-% IV SOLN
20.0000 mg | Freq: Two times a day (BID) | INTRAVENOUS | Status: DC
  Administered 2018-05-26 – 2018-05-27 (×2): 20 mg via INTRAVENOUS
  Filled 2018-05-26 (×2): qty 50

## 2018-05-26 MED ORDER — ACETAMINOPHEN 650 MG RE SUPP: 650.0000 mg | Freq: Four times a day (QID) | RECTAL | Status: DC | PRN

## 2018-05-26 MED ORDER — ACETAMINOPHEN 325 MG PO TABS
650.0000 mg | ORAL_TABLET | Freq: Four times a day (QID) | ORAL | Status: DC | PRN
  Administered 2018-05-27 – 2018-05-31 (×4): 650 mg via ORAL
  Filled 2018-05-26 (×4): qty 2

## 2018-05-26 MED ORDER — TAMSULOSIN HCL 0.4 MG PO CAPS
0.4000 mg | ORAL_CAPSULE | Freq: Every day | ORAL | Status: DC
  Administered 2018-05-27 – 2018-06-01 (×6): 0.4 mg via ORAL
  Filled 2018-05-26 (×6): qty 1

## 2018-05-26 MED ORDER — SODIUM CHLORIDE 0.9 % IV BOLUS
500.0000 mL | Freq: Once | INTRAVENOUS | Status: AC
  Administered 2018-05-26: 500 mL via INTRAVENOUS

## 2018-05-26 MED ORDER — ALPRAZOLAM 0.5 MG PO TABS
0.5000 mg | ORAL_TABLET | Freq: Every evening | ORAL | Status: DC | PRN
  Administered 2018-05-26: 0.5 mg via ORAL
  Filled 2018-05-26: qty 1

## 2018-05-26 MED ORDER — SENNOSIDES-DOCUSATE SODIUM 8.6-50 MG PO TABS: 1.0000 | ORAL_TABLET | Freq: Every evening | ORAL | Status: DC | PRN

## 2018-05-26 MED ORDER — ONDANSETRON HCL 4 MG/2ML IJ SOLN: 4.0000 mg | Freq: Four times a day (QID) | INTRAMUSCULAR | Status: DC | PRN

## 2018-05-26 NOTE — H&P (Addendum)
[place] [facility] [place] NAME: Jathen Sudano    MR#:  [id number]  DATE OF BIRTH:  September 18, 1940  DATE OF ADMISSION:  05/26/2018  PRIMARY CARE PHYSICIAN: Idelle Crouch, MD   REQUESTING/REFERRING PHYSICIAN:   CHIEF COMPLAINT:   Chief Complaint  Patient presents with  . Weakness    HISTORY OF PRESENT ILLNESS: Daniel Ritthaler  is a 77 y.o. male with a known history of CVA in the past, left-sided Bell's palsy left lower extremity weakness from prior CVA, dementia, carotid artery occlusion who is a resident of [facility] assisted living facility was brought to the emergency room today for generalized weakness.  Patient was found to be more weak according to the family.  He usually uses a walker to ambulate at the facility.  Patient has generalized fatigue.  He was evaluated in the emergency room hemoglobin is 9.5.  Usually baseline hemoglobin is around 11.5-12.  Patient is on aspirin as well as Motrin at the facility.  Patient states he had a colonoscopy done in February which was a normal study.  Stool guaiac was positive in the emergency room.  Initially was also worked up for stroke in the emergency room with a CT head which showed no acute abnormality except for old strokes.  PAST MEDICAL HISTORY:   Past Medical History:  Diagnosis Date  . Anemia   . Bipolar 1 disorder ([place])   . Carotid artery occlusion   . Dementia   . Diabetes mellitus without complication ([place])   . Diabetic peripheral neuropathy ([place]) 03/03/2018  . Gait abnormality 02/12/2017  . Left-sided Bell's palsy 03/03/2018  . Memory disorder 02/12/2017  . TIA (transient ischemic attack)     PAST SURGICAL HISTORY:  Past Surgical History:  Procedure Laterality Date  . COLONOSCOPY WITH PROPOFOL N/A 02/25/2017   Procedure: COLONOSCOPY WITH PROPOFOL;  Surgeon: Manya Silvas, MD;  Location[facility] ENDOSCOPY;  Service: Endoscopy;  Laterality: N/A;  . ESOPHAGOGASTRODUODENOSCOPY  (EGD) WITH PROPOFOL N/A 02/25/2017   Procedure: ESOPHAGOGASTRODUODENOSCOPY (EGD) WITH PROPOFOL;  Surgeon: Manya Silvas, MD;  Location: [facility] ENDOSCOPY;  Service: Endoscopy;  Laterality: N/A;  . NASAL SINUS SURGERY    . PACEMAKER PLACEMENT    . TONSILLECTOMY    . TONSILLECTOMY      SOCIAL HISTORY:  Social History   Tobacco Use  . Smoking status: Former Smoker    Packs/day: 1.00    Types: Cigarettes, Cigars  . Smokeless tobacco: Never Used  Substance Use Topics  . Alcohol use: Yes    Comment: occasional    FAMILY HISTORY:  Family History  Problem Relation Age of Onset  . Allergies Mother   . Diabetes Father   . Diabetes Sister     DRUG ALLERGIES:  Allergies  Allergen Reactions  . Lisinopril Other (See Comments)    REACTION: Cough  . Penicillins Other (See Comments)    Reaction: unknown Has patient had a PCN reaction causing immediate rash, facial/tongue/throat swelling, SOB or lightheadedness with hypotension: Unknown Has patient had a PCN reaction causing severe rash involving mucus membranes or skin necrosis: Unknown Has patient had a PCN reaction that required hospitalization: Unknown Has patient had a PCN reaction occurring within the last 10 years: Unknown If all of the above answers are "NO", then may proceed with Cephalosporin use.   . Clindamycin/Lincomycin Rash and Other (See Comments)    "turns me purple" "turns me orange"    REVIEW OF SYSTEMS:  CONSTITUTIONAL: No fever,has  fatigue and weakness.  EYES: No blurred or double vision.  EARS, NOSE, AND THROAT: No tinnitus or ear pain.  RESPIRATORY: No cough, shortness of breath, wheezing or hemoptysis.  CARDIOVASCULAR: No chest pain, orthopnea, edema.  GASTROINTESTINAL: No nausea, vomiting, diarrhea or abdominal pain.  GENITOURINARY: No dysuria, hematuria.  ENDOCRINE: No polyuria, nocturia,  HEMATOLOGY: No anemia, easy bruising or bleeding SKIN: No rash or lesion. MUSCULOSKELETAL: No joint pain or  arthritis.   NEUROLOGIC: No tingling, numbness, weakness.  Left facial droop from prior cva PSYCHIATRY: No anxiety or depression.   MEDICATIONS AT HOME:  Prior to Admission medications   Medication Sig Start Date End Date Taking? Authorizing Provider  allopurinol (ZYLOPRIM) 300 MG tablet Take 1 tablet (300 mg total) by mouth daily as needed (gout). 05/12/18 05/12/19 Yes Medina-Vargas, Monina C, NP  ALPRAZolam (XANAX) 0.5 MG tablet Take 1 tablet (0.5 mg total) by mouth at bedtime as needed for anxiety. 04/28/18  Yes Gerlene Fee, NP  AMBULATORY NON FORMULARY MEDICATION BiPAP machine @ 15/12 DX: OSA DX Code: 02/09/17  Yes Wilhelmina Mcardle, MD  amphetamine-dextroamphetamine (ADDERALL) 20 MG tablet Take 1 tablet (20 mg total) by mouth daily. 04/28/18  Yes Gerlene Fee, NP  aspirin 325 MG tablet Take 1 tablet (325 mg total) by mouth daily. 04/27/18  Yes Sainani, Belia Heman, MD  desonide (DESOWEN) 0.05 % cream Apply 1 application topically 2 (two) times daily. 05/12/18 05/12/19 Yes Medina-Vargas, Monina C, NP  diclofenac sodium (VOLTAREN) 1 % GEL Apply 4 g topically 4 (four) times daily. to the right lateral hip 05/12/18  Yes Medina-Vargas, Monina C, NP  FREESTYLE LITE test strip 1 each by Other route as directed. 05/12/18  Yes Medina-Vargas, Monina C, NP  gabapentin (NEURONTIN) 100 MG capsule Take 2 capsules (200 mg total) by mouth 3 (three) times daily. 05/12/18 05/12/19 Yes Medina-Vargas, Monina C, NP  ibuprofen (ADVIL,MOTRIN) 400 MG tablet Take 400 mg by mouth every 6 (six) hours as needed for mild pain.    Yes [provider]  insulin glargine (LANTUS) 100 UNIT/ML injection Inject 10 Units into the skin at bedtime.   Yes [provider]  Lurasidone HCl (LATUDA) 60 MG TABS Take 1 tablet (60 mg total) by mouth daily. 05/12/18  Yes Medina-Vargas, Monina C, NP  omeprazole (PRILOSEC) 40 MG capsule Take 1 capsule (40 mg total) by mouth daily. 05/12/18  Yes Medina-Vargas, Monina C, NP   pravastatin (PRAVACHOL) 40 MG tablet Take 1 tablet (40 mg total) by mouth at bedtime. 05/12/18  Yes Medina-Vargas, Monina C, NP  tamsulosin (FLOMAX) 0.4 MG CAPS capsule Take 1 capsule (0.4 mg total) by mouth daily. 05/12/18  Yes Medina-Vargas, Monina C, NP  clotrimazole-betamethasone (LOTRISONE) cream Apply topically 2 (two) times daily. Apply to effected area Patient not taking: Reported on 05/26/2018 05/12/18   Medina-Vargas, Monina C, NP  insulin regular human CONCENTRATED (HUMULIN R) 500 UNIT/ML injection Inject 0.1 mLs (50 Units total) into the skin 2 (two) times daily with a meal. Patient not taking: Reported on 05/26/2018 05/12/18   Medina-Vargas, Monina C, NP  lamoTRIgine (LAMICTAL) 25 MG tablet Take 2 tablets (50 mg total) by mouth 2 (two) times daily. Patient not taking: Reported on 05/26/2018 05/12/18   Medina-Vargas, Monina C, NP      PHYSICAL EXAMINATION:   VITAL SIGNS: Blood pressure (!) 122/46, pulse (!) 56, temperature (!) 97.1 F (36.2 C), temperature source Oral, resp. rate 19, height 5\' 10"  (1.778 m), weight  113.4 kg, SpO2 95 %.  GENERAL:  77 y.o.-year-old patient lying in the bed with no acute distress.  EYES: Pupils equal, round, reactive to light and accommodation. No scleral icterus. Extraocular muscles intact.  HEENT: Head atraumatic, normocephalic. Oropharynx and nasopharynx clear.  NECK:  Supple, no jugular venous distention. No thyroid enlargement, no tenderness.  LUNGS: Normal breath sounds bilaterally, no wheezing, rales,rhonchi or crepitation. No use of accessory muscles of respiration.  CARDIOVASCULAR: S1, S2 normal. No murmurs, rubs, or gallops.  ABDOMEN: Soft, nontender, nondistended. Bowel sounds present. No organomegaly or mass.  EXTREMITIES: chronic lower extremity pedal edema, no cyanosis, or clubbing.  NEUROLOGIC: Cranial nerves II through XII are intact. Muscle strength 4/5 in lower extremities. 4/5 in left upper extremity, 5/5 in right upper extremity  Sensation intact. Gait not checked. Left facial droop from bells palsy PSYCHIATRIC: The patient is alert and oriented x 3.  SKIN: No obvious rash, lesion, or ulcer.   LABORATORY PANEL:   CBC Recent Labs  Lab 05/26/18 1111  WBC 3.6*  HGB 9.5*  HCT 29.0*  PLT 104*  MCV 68.5*  MCH 22.5*  MCHC 32.8  RDW 15.9*  LYMPHSABS 0.9*  MONOABS 0.4  EOSABS 0.1  BASOSABS 0.0   ------------------------------------------------------------------------------------------------------------------  Chemistries  Recent Labs  Lab 05/26/18 1111  NA 137  K 4.1  CL 105  CO2 26  GLUCOSE 349*  BUN 14  CREATININE 0.68  CALCIUM 8.7*  AST 42*  ALT 20  ALKPHOS 80  BILITOT 2.1*   ------------------------------------------------------------------------------------------------------------------ estimated creatinine clearance is 97.6 mL/min (by C-G formula based on SCr of 0.68 mg/dL). ------------------------------------------------------------------------------------------------------------------ No results for input(s): TSH, T4TOTAL, T3FREE, THYROIDAB in the last 72 hours.  Invalid input(s): FREET3   Coagulation profile No results for input(s): INR, PROTIME in the last 168 hours. ------------------------------------------------------------------------------------------------------------------- No results for input(s): DDIMER in the last 72 hours. -------------------------------------------------------------------------------------------------------------------  Cardiac Enzymes Recent Labs  Lab 05/26/18 1111  TROPONINI <0.03   ------------------------------------------------------------------------------------------------------------------ Invalid input(s): POCBNP  ---------------------------------------------------------------------------------------------------------------  Urinalysis    Component Value Date/Time   COLORURINE AMBER (A) 05/26/2018 1416   APPEARANCEUR CLEAR (A)  05/26/2018 1416   APPEARANCEUR Clear 11/05/2016 1514   LABSPEC 1.016 05/26/2018 1416   PHURINE 5.0 05/26/2018 1416   GLUCOSEU >=500 (A) 05/26/2018 1416   HGBUR NEGATIVE 05/26/2018 1416   BILIRUBINUR NEGATIVE 05/26/2018 1416   BILIRUBINUR Negative 11/05/2016 [place] 05/26/2018 1416   PROTEINUR NEGATIVE 05/26/2018 1416   NITRITE NEGATIVE 05/26/2018 1416   LEUKOCYTESUR NEGATIVE 05/26/2018 1416   LEUKOCYTESUR 1+ (A) 11/05/2016 1514     RADIOLOGY: Dg Chest 2 View  Result Date: 05/26/2018 CLINICAL DATA:  Bilateral leg weakness EXAM: CHEST - 2 VIEW COMPARISON:  04/24/2016 FINDINGS: Cardiac shadow remains enlarged. Pacing device is now seen in satisfactory position. Mild central vascular congestion is noted without interstitial edema. No sizable infiltrate or effusion is noted. No bony abnormality is seen. IMPRESSION: Mild vascular congestion without interstitial edema. Electronically Signed   By: Inez Catalina M.D.   On: 05/26/2018 11:48   Ct Head Wo Contrast  Result Date: 05/26/2018 CLINICAL DATA:  Leg weakness with history of remote stroke EXAM: CT HEAD WITHOUT CONTRAST TECHNIQUE: Contiguous axial images were obtained from the base of the skull through the vertex without intravenous contrast. COMPARISON:  04/24/2018 FINDINGS: Brain: Chronic atrophic and ischemic changes are identified. The previously seen hypodensity within the right periventricular white matter is again identified consistent with prior lacunar infarct. Scattered lacunar infarcts are noted within  the basal ganglia bilaterally similar to that seen on the prior exam. No findings to suggest acute hemorrhage, acute infarction or space-occupying mass lesion are noted. Vascular: No hyperdense vessel or unexpected calcification. Skull: Normal. Negative for fracture or focal lesion. Sinuses/Orbits: Orbits are within normal limits. The paranasal sinuses demonstrate opacification of the right maxillary antrum. Other: None  IMPRESSION: Chronic atrophic and ischemic changes. No acute infarct or hemorrhage is seen. Chronic appearing changes in the right maxillary antrum. Electronically Signed   By: Inez Catalina M.D.   On: 05/26/2018 11:47    EKG: Orders placed or performed during the hospital encounter of 05/26/18  . EKG 12-Lead  . EKG 12-Lead  . ED EKG  . ED EKG    IMPRESSION AND PLAN:  77 year old elderly male patient from [facility] assisted living facility with history of CVA, Bell's palsy, diabetes mellitus type 2,, carotid artery occlusion presented to the emergency room for weakness and fatigue  -Anemia with guaiac positive stool Symptomatic Clear liquid diet Hemoglobin hematocrit monitoring Hold aspirin and Motrin Gastroenterology evaluation to check whether patient needs any intervention IV Pepcid twice daily  -History of CVA and Bell's palsy Repeat CT head in a.m. to assess for any new stroke 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 and MRI of the brain cannot be done Continue statin  -Uncontrolled diabetes mellitus Sliding scale coverage with insulin Monitor blood sugars  -DVT prophylaxis Of blood thinner meds for now secondary to anemia with guaiac positive stool Sequential compression device to lower extremities    All the records are reviewed and case discussed with ED provider. Management plans discussed with the patient, family and they are in agreement.  CODE STATUS:DNR Code Status History    Date Active Date Inactive Code Status Order ID Comments User Context   04/25/2018 1126 04/27/2018 2050 DNR [id number]  Bettey Costa, MD Inpatient   04/25/2018 0051 04/25/2018 1126 Full Code [id number]  Lance Coon, MD Inpatient    Questions for Most Recent Historical Code Status (Order [id number])    Question Answer Comment   In the event of cardiac or respiratory ARREST Do not call a "code blue"    In the event of cardiac or respiratory ARREST Do not perform  Intubation, CPR, defibrillation or ACLS    In the event of cardiac or respiratory ARREST Use medication by any route, position, wound care, and other measures to relive pain and suffering. May use oxygen, suction and manual treatment of airway obstruction as needed for comfort.         Advance Directive Documentation     Most Recent Value  Type of Advance Directive  Out of facility DNR (pink MOST or yellow form)  Pre-existing out of facility DNR order (yellow form or pink MOST form)  -  "MOST" Form in Place?  -       TOTAL TIME TAKING CARE OF THIS PATIENT: 52 minutes.    Saundra Shelling M.D on 05/26/2018 at 3:23 PM  Between 7am to 6pm - Pager - [phone]  After 6pm go to [email or web address] - password [email or web address] [facility]  Office  [phone]  CC: Primary care physician; Idelle Crouch, MD

## 2018-05-26 NOTE — ED Notes (Signed)
Attempted to call report to [facility].

## 2018-05-26 NOTE — ED Triage Notes (Signed)
Pt arrives from [facility] via [facility] with c/c of leg weakness. Pt has Hx of stroke approx 6 wks ago. Pt denies HA, LoC, numbness/tingling in extremities. EMS reports CBG of 468, last known insulin this am at living facility, verified by pts son at bedside.

## 2018-05-26 NOTE — ED Notes (Addendum)
During in and out this RN and Tom, RN noted large amounts of MASD to patient's groin and penile area. MD made aware, to bedside to assess. Will continue to monitor. Pt repositioned in bed, patient's family given an update, will continue to monitor for further patient needs/chagnes in patient condition.

## 2018-05-26 NOTE — ED Notes (Signed)
Pt returned from CT °

## 2018-05-26 NOTE — ED Notes (Signed)
Pt repositioned in bed with by this RN. Pt's family remains at bedside at this time. Pt denies further needs. Will continue to monitor for further patient needs.

## 2018-05-26 NOTE — Progress Notes (Signed)
Advanced care plan. Purpose of the Encounter: CODE STATUS Parties in Attendance:Patient and family Patient's Decision Capacity:Good Subjective/Patient's story: Presented to ER from facility for weakness Objective/Medical story Has anemia with guiac positive stool Needs GI evaluation Goals of care determination:  Advance care directives and goals of care discussed Patient and family do not want any cardiac resuscitation, intubation and ventilator if need arises CODE STATUS: DNR Time spent discussing advanced care planning: 16 minutes

## 2018-05-26 NOTE — Plan of Care (Signed)
The patient has been stable since being admitted to [facility]. Telemetry monitored placed as well as Cpox. Fall prevention. Bed alarm. Bed PT and OT order placed. Bed low. Diet changed and tolerated.  Problem: Education: Goal: Knowledge of General Education information will improve Description Including pain rating scale, medication(s)/side effects and non-pharmacologic comfort measures Outcome: Progressing   Problem: Health Behavior/Discharge Planning: Goal: Ability to manage health-related needs will improve Outcome: Progressing   Problem: Clinical Measurements: Goal: Ability to maintain clinical measurements within normal limits will improve Outcome: Progressing Goal: Will remain free from infection Outcome: Progressing Goal: Diagnostic test results will improve Outcome: Progressing Goal: Respiratory complications will improve Outcome: Progressing Goal: Cardiovascular complication will be avoided Outcome: Progressing   Problem: Activity: Goal: Risk for activity intolerance will decrease Outcome: Progressing   Problem: Nutrition: Goal: Adequate nutrition will be maintained Outcome: Progressing   Problem: Coping: Goal: Level of anxiety will decrease Outcome: Progressing   Problem: Elimination: Goal: Will not experience complications related to bowel motility Outcome: Progressing Goal: Will not experience complications related to urinary retention Outcome: Progressing   Problem: Pain Managment: Goal: General experience of comfort will improve Outcome: Progressing   Problem: Safety: Goal: Ability to remain free from injury will improve Outcome: Progressing   Problem: Skin Integrity: Goal: Risk for impaired skin integrity will decrease Outcome: Progressing

## 2018-05-26 NOTE — ED Provider Notes (Signed)
[facility] Emergency Department Provider Note       Time seen: ----------------------------------------- 10:57 AM on 05/26/2018 -----------------------------------------   I have reviewed the triage vital signs and the nursing notes.  HISTORY   Chief Complaint Weakness    HPI [name] Day is a 77 y.o. 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  Patient reports worsening weakness in both lower extremities that he noticed this morning.  Initially he felt somewhat weak but this progressed significantly.  He does not have any new neurologic symptoms other than weakness in both legs.  He denies any recent illness, states his blood sugar has been under control.  Past Medical History:  Diagnosis Date  . Anemia   . Bipolar 1 disorder ([place])   . Carotid artery occlusion   . Dementia   . Diabetes mellitus without complication ([place])   . Diabetic peripheral neuropathy ([place]) 03/03/2018  . Gait abnormality 02/12/2017  . Left-sided Bell's palsy 03/03/2018  . Memory disorder 02/12/2017  . TIA (transient ischemic attack)     Patient Active Problem List   Diagnosis Date Noted  . Dyslipidemia associated with type 2 diabetes mellitus ([place]) 05/22/2018  . GERD without esophagitis 05/22/2018  . Somnolence 05/22/2018  . Bipolar 1 disorder ([place]) 05/12/2018  . Frequent falls 04/24/2018  . Expressive aphasia 04/24/2018  . Left-sided Bell's palsy 03/03/2018  . Diabetic peripheral neuropathy ([place]) 03/03/2018  . Abnormal LFTs 09/08/2017  . 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 ED (erectile dysfunction) 09/08/2017  . Environmental allergies 09/08/2017  . Nephrolithiasis 09/08/2017  . Non-cardiac chest pain 09/08/2017  . Rectal bleeding 09/08/2017  . Sinusitis 09/08/2017  . Varicose veins of leg with swelling, bilateral 06/12/2017  . Venous ulcer ([place]) 05/18/2017  .  Chronic venous insufficiency 05/18/2017  . Memory disorder 02/12/2017  . Gait abnormality 02/12/2017  . CKD stage 3 due to type 2 diabetes mellitus ([place]) 02/06/2017  . Hyperlipidemia 02/06/2017  . Swelling of limb 02/06/2017  . Lymphedema 02/06/2017  . Heme positive stool 12/04/2016  . Mixed dementia 07/29/2016  . Cardiac pacemaker in situ 04/27/2016  . AV heart block 04/24/2016  . Hyperglycemia 04/24/2016  . Generalized weakness 04/24/2016  . Syncope and collapse 04/24/2016  . Metal bone fixation hardware in place 04/21/2016  . Bilateral carotid artery stenosis 02/05/2015  . OSA (obstructive sleep apnea) 02/05/2015  . Benign essential hypertension 01/19/2015  . B12 deficiency 05/23/2014  . Morbid obesity with alveolar hypoventilation ([place]) 05/23/2014  . Long-term insulin use ([place]) 05/22/2014  . Type II diabetes mellitus with manifestations, uncontrolled ([place]) 05/22/2014  . Aortic stenosis, moderate 11/14/2013  . ANEMIA-NOS 03/29/2007  . Anxiety 03/29/2007  . DEPRESSION 03/29/2007  . ALLERGIC RHINITIS 03/29/2007  . ASTHMA 03/29/2007  . Osteoarthritis 03/29/2007  . NEPHROLITHIASIS, HX OF 03/29/2007    Past Surgical History:  Procedure Laterality Date  . COLONOSCOPY WITH PROPOFOL N/A 02/25/2017   Procedure: COLONOSCOPY WITH PROPOFOL;  Surgeon: Manya Silvas, MD;  Location: [facility] ENDOSCOPY;  Service: Endoscopy;  Laterality: N/A;  . ESOPHAGOGASTRODUODENOSCOPY (EGD) WITH PROPOFOL N/A 02/25/2017   Procedure: ESOPHAGOGASTRODUODENOSCOPY (EGD) WITH PROPOFOL;  Surgeon: Manya Silvas, MD;  Location: [facility] ENDOSCOPY;  Service: Endoscopy;  Laterality: N/A;  . NASAL SINUS SURGERY    . PACEMAKER PLACEMENT    . TONSILLECTOMY    . TONSILLECTOMY      Allergies Lisinopril; Penicillins; and Clindamycin/lincomycin  Social History Social  History   Tobacco Use  . Smoking status: Former Smoker    Packs/day: 1.00    Types: Cigarettes, Cigars  . Smokeless tobacco: Never Used   Substance Use Topics  . Alcohol use: Yes    Comment: occasional  . Drug use: No   Review of Systems Constitutional: Negative for fever. Cardiovascular: Negative for chest pain. Respiratory: Negative for shortness of breath. Gastrointestinal: Negative for abdominal pain, vomiting and diarrhea. Genitourinary: Negative for dysuria. Musculoskeletal: Negative for back pain. Skin: Negative for rash. Neurological: Positive for weakness  All systems negative/normal/unremarkable except as stated in the HPI  ____________________________________________   PHYSICAL EXAM:  VITAL SIGNS: ED Triage Vitals  Enc Vitals Group     BP      Pulse      Resp      Temp      Temp src      SpO2      Weight      Height      Head Circumference      Peak Flow      Pain Score      Pain Loc      Pain Edu?      Excl. in [place]?    Constitutional: Alert and oriented. Well appearing and in no distress. Eyes: Conjunctivae are normal. Normal extraocular movements. ENT   Head: Normocephalic and atraumatic.   Nose: No congestion/rhinnorhea.   Mouth/Throat: Mucous membranes are moist.   Neck: No stridor. Cardiovascular: Normal rate, regular rhythm. No murmurs, rubs, or gallops. Respiratory: Normal respiratory effort without tachypnea nor retractions. Breath sounds are clear and equal bilaterally. No wheezes/rales/rhonchi. Gastrointestinal: Soft and nontender. Normal bowel sounds.  Heme positive stool Musculoskeletal: Nontender with normal range of motion in extremities. No lower extremity tenderness nor edema. Neurologic:  Normal speech and language. No gross focal neurologic deficits are appreciated.  Strength, sensation appear to be normal.  There is left-sided facial droop from reported history of Bell's palsy Skin:  Skin is warm, dry and intact. No rash noted. Psychiatric: Mood and affect are normal. Speech and behavior are normal.  ____________________________________________  EKG:  Interpreted by me.  AV dual paced rhythm with some inhibition, rate is 84 bpm.  ____________________________________________  ED COURSE:  As part of my medical decision making, I reviewed the following data within the [place] History obtained from family if available, nursing notes, old chart and ekg, as well as notes from prior ED visits. Patient presented for weakness, we will assess with labs and imaging as indicated at this time.   Procedures ____________________________________________   LABS (pertinent positives/negatives)  Labs Reviewed  CBC WITH DIFFERENTIAL/PLATELET - Abnormal; Notable for the following components:      Result Value   WBC 3.6 (*)    RBC 4.23 (*)    Hemoglobin 9.5 (*)    HCT 29.0 (*)    MCV 68.5 (*)    MCH 22.5 (*)    RDW 15.9 (*)    Platelets 104 (*)    Lymphs Abs 0.9 (*)    All other components within normal limits  COMPREHENSIVE METABOLIC PANEL - Abnormal; Notable for the following components:   Glucose, Bld 349 (*)    Calcium 8.7 (*)    Total Protein 5.8 (*)    Albumin 3.3 (*)    AST 42 (*)    Total Bilirubin 2.1 (*)    All other components within normal limits  GLUCOSE, CAPILLARY - Abnormal; Notable for the  following components:   Glucose-Capillary 320 (*)    All other components within normal limits  TROPONIN I  URINALYSIS, COMPLETE (UACMP) WITH MICROSCOPIC  CBG MONITORING, ED    RADIOLOGY Images were viewed by me  CT head IMPRESSION: Chronic atrophic and ischemic changes.  No acute infarct or hemorrhage is seen.  Chronic appearing changes in the right maxillary antrum. IMPRESSION: Mild vascular congestion without interstitial edema. ____________________________________________  DIFFERENTIAL DIAGNOSIS   Weakness, dehydration, electrolyte abnormality, occult infection, CVA, MI  FINAL ASSESSMENT AND PLAN  Weakness, anemia, hyperglycemia, heme positive stool  Plan: The patient had presented for  bilateral lower extremity weakness. Patient's labs did reveal chronic kidney disease which was unchanged however his anemia has worsened.  He was also hyperglycemic with a blood sugar in the 300s and we gave him IV fluids for this.  Patient's imaging not reveal any acute process, he has mild vascular congestion on his x-ray but the CT of his brain is unremarkable.  Ideally he would benefit from an MRI.   Laurence Aly, MD   Note: This note was generated in part or whole with voice recognition software. Voice recognition is usually quite accurate but there are transcription errors that can and very often do occur. I apologize for any typographical errors that were not detected and corrected.     Earleen Newport, MD 05/26/18 [phone]

## 2018-05-26 NOTE — ED Notes (Signed)
Patient transported to CT 

## 2018-05-27 ENCOUNTER — Inpatient Hospital Stay: Payer: Medicare Other

## 2018-05-27 DIAGNOSIS — R531 Weakness: Secondary | ICD-10-CM

## 2018-05-27 DIAGNOSIS — K922 Gastrointestinal hemorrhage, unspecified: Secondary | ICD-10-CM

## 2018-05-27 LAB — BLOOD GAS, ARTERIAL
Acid-Base Excess: 1.7 mmol/L (ref 0.0–2.0)
Bicarbonate: 25.7 mmol/L (ref 20.0–28.0)
FIO2: 0.21
O2 SAT: 95.5 %
PCO2 ART: 37 mmHg (ref 32.0–48.0)
PH ART: 7.45 (ref 7.350–7.450)
Patient temperature: 37
pO2, Arterial: 75 mmHg — ABNORMAL LOW (ref 83.0–108.0)

## 2018-05-27 LAB — HEMOGLOBIN AND HEMATOCRIT, BLOOD
HCT: 29.6 % — ABNORMAL LOW (ref 40.0–52.0)
HCT: 30 % — ABNORMAL LOW (ref 40.0–52.0)
HEMATOCRIT: 30.9 % — AB (ref 40.0–52.0)
HEMOGLOBIN: 10 g/dL — AB (ref 13.0–18.0)
HEMOGLOBIN: 9.9 g/dL — AB (ref 13.0–18.0)
Hemoglobin: 10.2 g/dL — ABNORMAL LOW (ref 13.0–18.0)

## 2018-05-27 LAB — BASIC METABOLIC PANEL
ANION GAP: 8 (ref 5–15)
BUN: 11 mg/dL (ref 8–23)
CO2: 26 mmol/L (ref 22–32)
Calcium: 8.7 mg/dL — ABNORMAL LOW (ref 8.9–10.3)
Chloride: 108 mmol/L (ref 98–111)
Creatinine, Ser: 0.85 mg/dL (ref 0.61–1.24)
GFR calc non Af Amer: >60 mL/min (ref >60)
Glucose, Bld: 140 mg/dL — ABNORMAL HIGH (ref 70–99)
Potassium: 3.8 mmol/L (ref 3.5–5.1)
Sodium: 142 mmol/L (ref 135–145)

## 2018-05-27 LAB — SEDIMENTATION RATE: Sed Rate: 21 mm/hr — ABNORMAL HIGH (ref 0–20)

## 2018-05-27 LAB — AMMONIA: Ammonia: 71 umol/L — ABNORMAL HIGH (ref 9–35)

## 2018-05-27 LAB — FERRITIN: Ferritin: 147 ng/mL (ref 24–336)

## 2018-05-27 LAB — GLUCOSE, CAPILLARY
GLUCOSE-CAPILLARY: 133 mg/dL — AB (ref 70–99)
GLUCOSE-CAPILLARY: 172 mg/dL — AB (ref 70–99)
GLUCOSE-CAPILLARY: 173 mg/dL — AB (ref 70–99)

## 2018-05-27 LAB — IRON AND TIBC
IRON: 98 ug/dL (ref 45–182)
SATURATION RATIOS: 35 % (ref 17.9–39.5)
TIBC: 282 ug/dL (ref 250–450)
UIBC: 184 ug/dL

## 2018-05-27 LAB — VITAMIN B12: Vitamin B-12: 247 pg/mL (ref 180–914)

## 2018-05-27 LAB — MRSA PCR SCREENING: MRSA by PCR: NEGATIVE

## 2018-05-27 LAB — FOLATE: Folate: 9.3 ng/mL (ref >5.9)

## 2018-05-27 MED ORDER — FAMOTIDINE 20 MG PO TABS
20.0000 mg | ORAL_TABLET | Freq: Two times a day (BID) | ORAL | Status: DC
  Administered 2018-05-27 – 2018-06-01 (×10): 20 mg via ORAL
  Filled 2018-05-27 (×10): qty 1

## 2018-05-27 MED ORDER — LURASIDONE HCL 40 MG PO TABS
60.0000 mg | ORAL_TABLET | Freq: Every day | ORAL | Status: DC
  Administered 2018-05-27 – 2018-06-01 (×6): 60 mg via ORAL
  Filled 2018-05-27 (×6): qty 2

## 2018-05-27 MED ORDER — LACTULOSE 10 GM/15ML PO SOLN
30.0000 g | Freq: Two times a day (BID) | ORAL | Status: DC
  Administered 2018-05-27 – 2018-05-28 (×4): 30 g via ORAL
  Filled 2018-05-27 (×4): qty 60

## 2018-05-27 MED ORDER — LAMOTRIGINE 25 MG PO TABS
50.0000 mg | ORAL_TABLET | Freq: Two times a day (BID) | ORAL | Status: DC
  Administered 2018-05-27 – 2018-06-01 (×11): 50 mg via ORAL
  Filled 2018-05-27 (×12): qty 2

## 2018-05-27 NOTE — Progress Notes (Signed)
LCSW called and spoke to [place] at [place] and patient resides at  [place].  [employer] LCSW [phone]

## 2018-05-27 NOTE — Progress Notes (Signed)
Clinical engineering was called to inspect the patients home CPAP machine. Clinical engineering returned the phone call stating that they would check the patients home CPAP in the morning. I put a Dreamstation in the patient's room for him to use tonight.

## 2018-05-27 NOTE — Clinical Social Work Note (Signed)
PT is recommending short term rehab. CSW spoke with [name] who was at bedside and Ben's wife who was also at bedside. They are hoping that when patient's ammonia level improves, that patient will do better with PT. They have been private paying at [facility] and are agreeable for CSW to begin a bed search for local facilities in the event he does not go back to [facility]. Patient has been to [facility] in the recent past. Shela Leff MSW,LcSW [phone]

## 2018-05-27 NOTE — Consult Note (Addendum)
Jonathon Bellows , MD [address] [place], [place], [place], [address] [address] [place], [place], [place], [address] Phone: [phone]  Fax: [phone]  Consultation  Referring Provider:   DR Posey Pronto Primary Care Physician:  Idelle Crouch, MD Primary Gastroenterologist:  Dr. Tiffany Kocher          Reason for Consultation:     Anemia   Date of Admission:  05/26/2018 Date of Consultation:  05/27/2018         HPI:   [name] Day is a 77 y.o. male has seen Dr. Vira Agar as an outpatient initially seen in April 2018.  The point of time was referred for heme positive stools.  Did have a microcytic anemia back then.  IMA globin 10.8 g with an MCV of 70.  Subsequently had an EGD and colonoscopy in June 2018.  Occasions for colonoscopy was for iron deficiency anemia and a diminutive polyp was excised.  EGD demonstrated portal hypertensive gastropathy.  For some unknown reason there has been no subsequent follow-up and no capsule study of the small bowel was mentioned to complete work-up for iron deficiency anemia.  He presented to the hospital on 05/26/2018 with generalized weakness.  He was evaluated in the emergency room and found to have a hemoglobin of 9.5.  Had been on aspirin and Motrin at the facility he lives in.  Admission his hemoglobin was 9.5 g with an MCV of 68.5.  His hemoglobin 1 month back was 11.6 g.  No elevations in BUN creatinine ratio.  Do not see any recent iron studies.  When I went to speak to him he was in the room with a companion and his son.  His son mentioned that the reason he was actually brought in was that he noticed his father's speech was most abnormal and he was dragging his left foot, he appeared more weak on the left side and he is a Buyer, retail to and he too felt that may be having a mini stroke or stroke.  During the process he had his hemoglobin checked and was found to be anemic.  There is no overt blood loss noted and hence they resorted to  checking a stool test for blood.  He has been on aspirin and has been taking some ibuprofen as well.  The patient's memory is good at times and poor at times at this point of time he was orientated to place, year, month.  He denies any discomfort, no abdominal pain.  Past Medical History:  Diagnosis Date  . Anemia   . Bipolar 1 disorder ([place])   . Carotid artery occlusion   . Dementia   . Diabetes mellitus without complication ([place])   . Diabetic peripheral neuropathy ([place]) 03/03/2018  . Gait abnormality 02/12/2017  . Left-sided Bell's palsy 03/03/2018  . Memory disorder 02/12/2017  . TIA (transient ischemic attack)     Past Surgical History:  Procedure Laterality Date  . COLONOSCOPY WITH PROPOFOL N/A 02/25/2017   Procedure: COLONOSCOPY WITH PROPOFOL;  Surgeon: Manya Silvas, MD;  Location: [facility] ENDOSCOPY;  Service: Endoscopy;  Laterality: N/A;  . ESOPHAGOGASTRODUODENOSCOPY (EGD) WITH PROPOFOL N/A 02/25/2017   Procedure: ESOPHAGOGASTRODUODENOSCOPY (EGD) WITH PROPOFOL;  Surgeon: Manya Silvas, MD;  Location: [facility] ENDOSCOPY;  Service: Endoscopy;  Laterality: N/A;  . NASAL SINUS SURGERY    . PACEMAKER PLACEMENT    . TONSILLECTOMY    . TONSILLECTOMY      Prior to Admission medications   Medication Sig  Start Date End Date Taking? Authorizing Provider  allopurinol (ZYLOPRIM) 300 MG tablet Take 1 tablet (300 mg total) by mouth daily as needed (gout). 05/12/18 05/12/19 Yes Medina-Vargas, Monina C, NP  ALPRAZolam (XANAX) 0.5 MG tablet Take 1 tablet (0.5 mg total) by mouth at bedtime as needed for anxiety. 04/28/18  Yes Gerlene Fee, NP  AMBULATORY NON FORMULARY MEDICATION BiPAP machine @ 15/12 DX: OSA DX Code: 02/09/17  Yes Wilhelmina Mcardle, MD  amphetamine-dextroamphetamine (ADDERALL) 20 MG tablet Take 1 tablet (20 mg total) by mouth daily. 04/28/18  Yes Gerlene Fee, NP  aspirin 325 MG tablet Take 1 tablet (325 mg total) by mouth daily. 04/27/18  Yes Sainani, Belia Heman, MD  desonide  (DESOWEN) 0.05 % cream Apply 1 application topically 2 (two) times daily. 05/12/18 05/12/19 Yes Medina-Vargas, Monina C, NP  diclofenac sodium (VOLTAREN) 1 % GEL Apply 4 g topically 4 (four) times daily. to the right lateral hip 05/12/18  Yes Medina-Vargas, Monina C, NP  FREESTYLE LITE test strip 1 each by Other route as directed. 05/12/18  Yes Medina-Vargas, Monina C, NP  gabapentin (NEURONTIN) 100 MG capsule Take 2 capsules (200 mg total) by mouth 3 (three) times daily. 05/12/18 05/12/19 Yes Medina-Vargas, Monina C, NP  ibuprofen (ADVIL,MOTRIN) 400 MG tablet Take 400 mg by mouth every 6 (six) hours as needed for mild pain.    Yes [provider]  insulin glargine (LANTUS) 100 UNIT/ML injection Inject 10 Units into the skin at bedtime.   Yes [provider]  Lurasidone HCl (LATUDA) 60 MG TABS Take 1 tablet (60 mg total) by mouth daily. 05/12/18  Yes Medina-Vargas, Monina C, NP  omeprazole (PRILOSEC) 40 MG capsule Take 1 capsule (40 mg total) by mouth daily. 05/12/18  Yes Medina-Vargas, Monina C, NP  pravastatin (PRAVACHOL) 40 MG tablet Take 1 tablet (40 mg total) by mouth at bedtime. 05/12/18  Yes Medina-Vargas, Monina C, NP  tamsulosin (FLOMAX) 0.4 MG CAPS capsule Take 1 capsule (0.4 mg total) by mouth daily. 05/12/18  Yes Medina-Vargas, Monina C, NP  clotrimazole-betamethasone (LOTRISONE) cream Apply topically 2 (two) times daily. Apply to effected area Patient not taking: Reported on 05/26/2018 05/12/18   Medina-Vargas, Monina C, NP  insulin regular human CONCENTRATED (HUMULIN R) 500 UNIT/ML injection Inject 0.1 mLs (50 Units total) into the skin 2 (two) times daily with a meal. Patient not taking: Reported on 05/26/2018 05/12/18   Medina-Vargas, Monina C, NP  lamoTRIgine (LAMICTAL) 25 MG tablet Take 2 tablets (50 mg total) by mouth 2 (two) times daily. Patient not taking: Reported on 05/26/2018 05/12/18   Medina-Vargas, Senaida [place], NP    Family History  Problem Relation Age of Onset  .  Allergies Mother   . Diabetes Father   . Diabetes Sister      Social History   Tobacco Use  . Smoking status: Former Smoker    Packs/day: 1.00    Types: Cigarettes, Cigars  . Smokeless tobacco: Never Used  Substance Use Topics  . Alcohol use: Yes    Comment: occasional  . Drug use: No    Allergies as of 05/26/2018 - Review Complete 05/26/2018  Allergen Reaction Noted  . Lisinopril Other (See Comments)   . Penicillins Other (See Comments)   . Clindamycin/lincomycin Rash and Other (See Comments) 04/24/2016    Review of Systems:    All systems reviewed and negative except where noted in HPI.   Physical Exam:  Vital signs in last 24 hours: Temp:  [  97.1 F (36.2 C)-97.9 F (36.6 C)] 97.9 F (36.6 C) (09/26 0526) Pulse Rate:  [56-68] 59 (09/26 0526) Resp:  [14-21] 20 (09/26 0526) BP: (122-145)/(41-67) 132/47 (09/26 0526) SpO2:  [95 %-100 %] 100 % (09/26 0526) Weight:  [113.4 kg] 113.4 kg (09/25 1102)   General:   Pleasant, cooperative in NAD Head: Drooping of the left side of his face Eyes:   No icterus.   Conjunctiva pink. PERRLA. Ears:  Normal auditory acuity. Neck:  Supple; no masses or thyroidomegaly Lungs: Respirations even and unlabored. Lungs clear to auscultation bilaterally.   No wheezes, crackles, or rhonchi.  Heart:  Regular rate and rhythm;  Without murmur, clicks, rubs or gallops Abdomen:  Soft, nondistended, nontender. Normal bowel sounds. No appreciable masses or hepatomegaly.  No rebound or guarding.  Neurologic:  Alert and oriented x2;  Skin:  Intact without significant lesions or rashes. Cervical Nodes:  No significant cervical adenopathy. Psych:  Alert and cooperative.  Flat affect.  LAB RESULTS: Recent Labs    05/26/18 1111 05/26/18 1754 05/27/18 0011 05/27/18 0605  WBC 3.6*  --   --   --   HGB 9.5* 10.0* 9.9* 10.2*  HCT 29.0* 30.3* 29.6* 30.9*  PLT 104*  --   --   --    BMET Recent Labs    05/26/18 1111 05/27/18 0605  NA 137 142  K  4.1 3.8  CL 105 108  CO2 26 26  GLUCOSE 349* 140*  BUN 14 11  CREATININE 0.68 0.85  CALCIUM 8.7* 8.7*   LFT Recent Labs    05/26/18 1111  PROT 5.8*  ALBUMIN 3.3*  AST 42*  ALT 20  ALKPHOS 80  BILITOT 2.1*   PT/INR No results for input(s): LABPROT, INR in the last 72 hours.  STUDIES: Dg Chest 2 View  Result Date: 05/26/2018 CLINICAL DATA:  Bilateral leg weakness EXAM: CHEST - 2 VIEW COMPARISON:  04/24/2016 FINDINGS: Cardiac shadow remains enlarged. Pacing device is now seen in satisfactory position. Mild central vascular congestion is noted without interstitial edema. No sizable infiltrate or effusion is noted. No bony abnormality is seen. IMPRESSION: Mild vascular congestion without interstitial edema. Electronically Signed   By: Inez Catalina M.D.   On: 05/26/2018 11:48   Ct Head Wo Contrast  Result Date: 05/26/2018 CLINICAL DATA:  Leg weakness with history of remote stroke EXAM: CT HEAD WITHOUT CONTRAST TECHNIQUE: Contiguous axial images were obtained from the base of the skull through the vertex without intravenous contrast. COMPARISON:  04/24/2018 FINDINGS: Brain: Chronic atrophic and ischemic changes are identified. The previously seen hypodensity within the right periventricular white matter is again identified consistent with prior lacunar infarct. Scattered lacunar infarcts are noted within the basal ganglia bilaterally similar to that seen on the prior exam. No findings to suggest acute hemorrhage, acute infarction or space-occupying mass lesion are noted. Vascular: No hyperdense vessel or unexpected calcification. Skull: Normal. Negative for fracture or focal lesion. Sinuses/Orbits: Orbits are within normal limits. The paranasal sinuses demonstrate opacification of the right maxillary antrum. Other: None IMPRESSION: Chronic atrophic and ischemic changes. No acute infarct or hemorrhage is seen. Chronic appearing changes in the right maxillary antrum. Electronically Signed   By:  Inez Catalina M.D.   On: 05/26/2018 11:47      Impression / Plan:   [name] Day is a 77 y.o. y/o male who is known to Dr. Vira Agar at [facility].  He carries a diagnosis of iron deficiency anemia.  He was  partially investigated in 2018 with an upper endoscopy and colonoscopy which did not show any clear reason for the anemia.  For unclear reason there was no mention or follow-up further and possible of capsule study of the small bowel.  Patient has been admitted today with a drop in hemoglobin from 11 g to 9.5 g.  Is severely microcytic.  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 Possible chronic microcytosis from thalassemia and an acute blood loss probably from his upper GI tract as he has  a history  of use of aspirin as well as Motrin.  Plan 1.  Check B12 folate iron studies.  If iron levels are low suggest to give IV iron and follow-up with hematology as an outpatient for subsequent further iron therapy  2.  Discussed the risks and benefits of an upper endoscopy and utilization of anesthesia and possible issues with hypotension and extension of his stroke as he probably has at least  a mini stroke over the last few days due to a change in his neurological status and more weakness in the left side of his body which affected his walking..  The family decided they did not want any procedures at this point and would rather watch and wait and see how his hemoglobin trends.  If there is any evidence of overt GI bleeding then we would have no choice but to proceed with an EGD. 3.  Stop NSAID use.  Check H. pylori stool antigen.  Continue PPI at discharge.  Thank you for involving me in the care of this patient.      LOS: 1 day   Jonathon Bellows, MD  05/27/2018, 8:10 AM

## 2018-05-27 NOTE — Clinical Social Work Note (Addendum)
Clinical Social Work Assessment  Patient Details  Name: [name] Day MRN: [id number] Date of Birth: 19-Oct-1940  Date of referral:  05/27/18               Reason for consult:  Other (Comment Required)(From [facility] )                Permission sought to share information with:  Facility Sport and exercise psychologist, Family Supports Permission granted to share information::  Yes, Verbal Permission Granted  Name::     [name] Day, [place] [phone]  Agency::  [facility]  Relationship::     Contact Information:     Housing/Transportation Living arrangements for the past 2 months:  [place] of Information:  Adult Children, Medical Team, Facility Patient Interpreter Needed:  None Criminal Activity/Legal Involvement Pertinent to Current Situation/Hospitalization:  No - Comment as needed Significant Relationships:  Adult Children, Friend Lives with:  Facility Resident Do you feel safe going back to the place where you live?  Yes Need for family participation in patient care:  Yes (Comment)  Care giving concerns:  TBD   Social Worker assessment / plan:[name] Day  77 year old male presented with weakness. He has bipolar and recent stroke and his cognition is altered. Family determined to find out if he has early dementia   symptoms bells palsy left lower extremity weakness from prior CVA Patient is a resident of [facility] facility  Patient was found to be more weak according to the family.  He usually uses a walker to ambulate at the facility.  Patient has generalized fatigue.  Patient has low hemoglobin and  Stool guaiac was positive in the emergency room. Insurance is Pharmacist, [facility]. As per facility nurse Lattie Haw pt is able to return once discharged. Patient is currently in respite at [facility] with the option to become a full time resident. The family agree to have him go back to facility once  discharged.  Employment status:  Retired Health visitor, Other (Comment Required)([employer]) PT Recommendations:  Not assessed at this time Information / Referral to community resources:     Patient/Family's Response to care: He is at [facility] family will have him return for respite stay  Patient/Family's Understanding of and Emotional Response to Diagnosis, Current Treatment, and Prognosis:  Good understanding  Emotional Assessment Appearance:  Appears stated age Attitude/Demeanor/Rapport:  Gracious Affect (typically observed):  Calm Orientation:  Oriented to Self, Oriented to Place Alcohol / Substance use:  Not Applicable Psych involvement (Current and /or in the community):  No (Comment)  Discharge Needs  Concerns to be addressed:  No discharge needs identified Readmission within the last 30 days:  No Current discharge risk:  None Barriers to Discharge:  Continued Medical Work up, No Barriers Identified   Joana Reamer, LCSW 05/27/2018, 8:49 AM

## 2018-05-27 NOTE — Progress Notes (Signed)
Physical Therapy Evaluation Patient Details Name: [name] Day MRN: [id number] DOB: 30-Jun-1941 Today's Date: 05/27/2018   History of Present Illness   [name] Day  is a 77 y.o. male with a known history of CVA in the past, left-sided Bell's palsy left lower extremity weakness from prior CVA, dementia, carotid artery occlusion who is a resident of [facility] assisted living facility was brought to the emergency room today for generalized weakness.  Patient was found to be more weak according to the family.  He usually uses a walker to ambulate at the facility.  Patient has generalized fatigue.  He was evaluated in the emergency room hemoglobin is 9.5.  Usually baseline hemoglobin is around 11.5-12.  Patient is on aspirin as well as Motrin at the facility.  Patient states he had a colonoscopy done in February which was a normal study.  Stool guaiac was positive in the emergency room.  Initially was also worked up for stroke in the emergency room with a CT head which showed no acute abnormality except for old strokes.   Clinical Impression  Pt admitted with above diagnosis. Pt currently with functional limitations due to the deficits listed below (see PT Problem List).  Pt requires maxA+1 for trunk support with use of bed rails and heavy cues for sequencing to go from supine to sitting. Pt with some posterior listing during transfers. He is initially unsteady and sits back down in a somewhat uncontrolled fashion after first transfer. After second transfer pt is stable in standing with UE support on rolling walker. He is able to perform some static marching at EOB. Pt able to take short shuffling steps forward/backward at EOB. Pt is mildly unsteady but doesn't require external support by therapist. Pt is fatigued and he is currently unsafe to ambulate farther at this time. Patient's functional status is currently a gross deviation from his baseline. He would benefit from SNF placement at discharge.  Pt will benefit from PT services to address deficits in strength, balance, and mobility in order to return to full function at home.     Follow Up Recommendations SNF    Equipment Recommendations  None recommended by PT;Other (comment)(Pt needs to use walker at discharge)    Recommendations for Other Services       Precautions / Restrictions Precautions Precautions: Fall Restrictions Weight Bearing Restrictions: No      Mobility  Bed Mobility Overal bed mobility: Needs Assistance Bed Mobility: Supine to Sit     Supine to sit: Max assist     General bed mobility comments: Pt requires maxA+1 for trunk support with use of bed rails and heavy cues for sequencing  Transfers Overall transfer level: Needs assistance Equipment used: Rolling walker (2 wheeled) Transfers: Sit to/from Stand Sit to Stand: Min assist;+2 safety/equipment         General transfer comment: Pt with some posterior listing during transfers. He is initially unsteady and sits back down in a somewhat uncontrolled fashion after first transfer. After second transfer pt is stable in standing with UE support on rolling walker. He is able to perform some static marching at EOB  Ambulation/Gait Ambulation/Gait assistance: Min guard;+2 safety/equipment Gait Distance (Feet): 3 Feet Assistive device: Rolling walker (2 wheeled)       General Gait Details: Pt able to take short shuffling steps forward/backward at EOB. Pt is mildly unsteady but doesn't require external support by therapist. Pt is fatigued and he is currently unsafe to ambulate farther at this time  Stairs            Wheelchair Mobility    Modified Rankin (Stroke Patients Only)       Balance Overall balance assessment: Needs assistance Sitting-balance support: No upper extremity supported Sitting balance-Leahy Scale: Good     Standing balance support: Bilateral upper extremity supported Standing balance-Leahy Scale: Fair                                Pertinent Vitals/Pain Pain Assessment: No/denies pain    Home Living Family/patient expects to be discharged to:: Skilled nursing facility                 Additional Comments: Pt has most recently been at [facility]F    Prior Function Level of Independence: Needs assistance   Gait / Transfers Assistance Needed: Pt was recently ambulator around [facility] with rolling walker  ADL's / Homemaking Assistance Needed: Pt is independent with ADLs but requires assist with IADLs        Hand Dominance   Dominant Hand: Right    Extremity/Trunk Assessment   Upper Extremity Assessment Upper Extremity Assessment: Overall WFL for tasks assessed    Lower Extremity Assessment Lower Extremity Assessment: LLE deficits/detail LLE Deficits / Details: Generally deconditioned bilateral LE with only focal weakness being 3+/5 L ankle dorsiflexion       Communication   Communication: No difficulties  Cognition Arousal/Alertness: Awake/alert Behavior During Therapy: WFL for tasks assessed/performed Overall Cognitive Status: Impaired/Different from baseline Area of Impairment: Orientation                 Orientation Level: Disoriented to;Time             General Comments: Partially oriented to situation      General Comments      Exercises     Assessment/Plan    PT Assessment Patient needs continued PT services  PT Problem List Decreased strength;Decreased activity tolerance;Decreased balance;Decreased mobility       PT Treatment Interventions DME instruction;Gait training;Functional mobility training;Therapeutic activities;Therapeutic exercise;Balance training;Neuromuscular re-education;Patient/family education    PT Goals (Current goals can be found in the Care Plan section)  Acute Rehab PT Goals Patient Stated Goal: Return to prior function PT Goal Formulation: With patient/family Time For Goal Achievement:  06/10/18 Potential to Achieve Goals: Fair    Frequency Min 2X/week   Barriers to discharge        Co-evaluation               AM-PAC PT "6 Clicks" Daily Activity  Outcome Measure Difficulty turning over in bed (including adjusting bedclothes, sheets and blankets)?: Unable Difficulty moving from lying on back to sitting on the side of the bed? : Unable Difficulty sitting down on and standing up from a chair with arms (e.g., wheelchair, bedside commode, etc,.)?: Unable Help needed moving to and from a bed to chair (including a wheelchair)?: A Lot Help needed walking in hospital room?: A Lot Help needed climbing 3-5 steps with a railing? : Total 6 Click Score: 8    End of Session Equipment Utilized During Treatment: Gait belt Activity Tolerance: Patient tolerated treatment well Patient left: in bed;with call bell/phone within reach;with bed alarm set;with family/visitor present   PT Visit Diagnosis: Unsteadiness on feet (R26.81);Muscle weakness (generalized) (M62.81);History of falling (Z91.81);Difficulty in walking, not elsewhere classified (R26.2)    Time: 7673-4193 PT Time Calculation (min) (ACUTE ONLY): 28  min   Charges:   PT Evaluation $PT Eval Low Complexity: 1 Low PT Treatments $Therapeutic Activity: 8-22 mins        Lyndel Safe Huprich PT, DPT, GCS   Huprich,Jason 05/27/2018, 3:10 PM

## 2018-05-27 NOTE — Progress Notes (Signed)
PHARMACIST - PHYSICIAN COMMUNICATION  DR:   Posey Pronto  CONCERNING: IV to Oral Route Change Policy  RECOMMENDATION: This patient is receiving famotidine by the intravenous route.  Based on criteria approved by the Pharmacy and Therapeutics Committee, the intravenous medication(s) is/are being converted to the equivalent oral dose form(s).   DESCRIPTION: These criteria include:  The patient is eating (either orally or via tube) and/or has been taking other orally administered medications for a least 24 hours  The patient has no evidence of active gastrointestinal bleeding or impaired GI absorption (gastrectomy, short bowel, patient on TNA or NPO).  If you have questions about this conversion, please contact the Pharmacy Department  []   [phone] )  [facility] [x]   [phone] )  [facility] []   [phone] )  [facility] []   [phone] )  [facility] []   [phone] )  [facility]   Vallery Sa, PharmD

## 2018-05-27 NOTE — Progress Notes (Signed)
OT Cancellation Note  Patient Details Name: [name] Day MRN: [id number] DOB: 02-26-1941   Cancelled Treatment:    Reason Eval/Treat Not Completed: Other (comment). Order received, chart reviewed. Upon initial attempt, pt with RN for pt care. RN requesting OT come back. Will re-attempt at later date/time as pt is available, medically appropriate, and as schedule permits.   Jeni Salles, MPH, MS, OTR/L ascom [phone] 05/27/18, 12:13 PM

## 2018-05-27 NOTE — Consult Note (Signed)
Referring Physician: Saundra Shelling, MD    Chief Complaint: Generalized weakness  HPI: [name] Day is an 77 y.o. male with pertinent history of CVA with residual deficits of left lower extremity weakness, left-sided Bell's palsy, dementia, diabetes, gait abnormality, anemia, carotid artery occlusion, presenting to the ED with progressive weakness of lower extremities.  Patient is currently a resident at [facility] assisted living, per family. Patient is a poor historian but it appears that the patient became lethargic and speech became slurred.  Was brought in for evaluation where left side was felt to be weak.  Patient is currently improved but not completely back to baseline.    Date last known well: Unable to determine Time last known well: Unable to determine tPA Given: No: Unable to determine LKLW  Past Medical History:  Diagnosis Date  . Anemia   . Bipolar 1 disorder ([place])   . Carotid artery occlusion   . Dementia   . Diabetes mellitus without complication ([place])   . Diabetic peripheral neuropathy ([place]) 03/03/2018  . Gait abnormality 02/12/2017  . Left-sided Bell's palsy 03/03/2018  . Memory disorder 02/12/2017  . TIA (transient ischemic attack)     Past Surgical History:  Procedure Laterality Date  . COLONOSCOPY WITH PROPOFOL N/A 02/25/2017   Procedure: COLONOSCOPY WITH PROPOFOL;  Surgeon: Manya Silvas, MD;  Location: [facility] ENDOSCOPY;  Service: Endoscopy;  Laterality: N/A;  . ESOPHAGOGASTRODUODENOSCOPY (EGD) WITH PROPOFOL N/A 02/25/2017   Procedure: ESOPHAGOGASTRODUODENOSCOPY (EGD) WITH PROPOFOL;  Surgeon: Manya Silvas, MD;  Location: [facility] ENDOSCOPY;  Service: Endoscopy;  Laterality: N/A;  . NASAL SINUS SURGERY    . PACEMAKER PLACEMENT    . TONSILLECTOMY    . TONSILLECTOMY      Family History  Problem Relation Age of Onset  . Allergies Mother   . Diabetes Father   . Diabetes Sister    Social History:  reports that he has quit smoking. His smoking use included  cigarettes and cigars. He smoked 1.00 pack per day. He has never used smokeless tobacco. He reports that he drinks alcohol. He reports that he does not use drugs.  Allergies:  Allergies  Allergen Reactions  . Lisinopril Other (See Comments)    REACTION: Cough  . Penicillins Other (See Comments)    Reaction: unknown Has patient had a PCN reaction causing immediate rash, facial/tongue/throat swelling, SOB or lightheadedness with hypotension: Unknown Has patient had a PCN reaction causing severe rash involving mucus membranes or skin necrosis: Unknown Has patient had a PCN reaction that required hospitalization: Unknown Has patient had a PCN reaction occurring within the last 10 years: Unknown If all of the above answers are "NO", then may proceed with Cephalosporin use.   . Clindamycin/Lincomycin Rash and Other (See Comments)    "turns me purple" "turns me orange"    Medications:  I have reviewed the patient's current medications. Prior to Admission:  Medications Prior to Admission  Medication Sig Dispense Refill Last Dose  . allopurinol (ZYLOPRIM) 300 MG tablet Take 1 tablet (300 mg total) by mouth daily as needed (gout). 30 tablet 0 PRN at PRN  . ALPRAZolam (XANAX) 0.5 MG tablet Take 1 tablet (0.5 mg total) by mouth at bedtime as needed for anxiety. 14 tablet 0 PRN at PRN  . AMBULATORY NON FORMULARY MEDICATION BiPAP machine @ 15/12 DX: OSA DX Code: 1 each 0 Taking  . amphetamine-dextroamphetamine (ADDERALL) 20 MG tablet Take 1 tablet (20 mg total) by mouth daily. 30 tablet 0 05/26/2018 at  0900  . aspirin 325 MG tablet Take 1 tablet (325 mg total) by mouth daily.   05/26/2018 at 0900  . desonide (DESOWEN) 0.05 % cream Apply 1 application topically 2 (two) times daily. 30 g 0 05/26/2018 at 0600  . diclofenac sodium (VOLTAREN) 1 % GEL Apply 4 g topically 4 (four) times daily. to the right lateral hip 100 g 0 05/26/2018 at 0700  . FREESTYLE LITE test strip 1 each by Other route as directed.  100 each 0   . gabapentin (NEURONTIN) 100 MG capsule Take 2 capsules (200 mg total) by mouth 3 (three) times daily. 180 capsule 0 05/26/2018 at 0900  . ibuprofen (ADVIL,MOTRIN) 400 MG tablet Take 400 mg by mouth every 6 (six) hours as needed for mild pain.    PRN at PRN  . insulin glargine (LANTUS) 100 UNIT/ML injection Inject 10 Units into the skin at bedtime.   05/25/2018 at 2100  . Lurasidone HCl (LATUDA) 60 MG TABS Take 1 tablet (60 mg total) by mouth daily. 30 tablet 0 05/26/2018 at 0900  . omeprazole (PRILOSEC) 40 MG capsule Take 1 capsule (40 mg total) by mouth daily. 30 capsule 0 05/26/2018 at 0900  . pravastatin (PRAVACHOL) 40 MG tablet Take 1 tablet (40 mg total) by mouth at bedtime. 30 tablet 0 05/25/2018 at 2100  . tamsulosin (FLOMAX) 0.4 MG CAPS capsule Take 1 capsule (0.4 mg total) by mouth daily. 30 capsule 0 05/26/2018 at 0900  . clotrimazole-betamethasone (LOTRISONE) cream Apply topically 2 (two) times daily. Apply to effected area (Patient not taking: Reported on 05/26/2018) 30 g 0 Not Taking at Unknown time  . insulin regular human CONCENTRATED (HUMULIN R) 500 UNIT/ML injection Inject 0.1 mLs (50 Units total) into the skin 2 (two) times daily with a meal. (Patient not taking: Reported on 05/26/2018) 30 mL 0 Not Taking at Unknown time  . lamoTRIgine (LAMICTAL) 25 MG tablet Take 2 tablets (50 mg total) by mouth 2 (two) times daily. (Patient not taking: Reported on 05/26/2018) 180 tablet 0 Not Taking at Unknown time   Scheduled: . amphetamine-dextroamphetamine  20 mg Oral Daily  . famotidine  20 mg Oral BID  . insulin aspart  0-15 Units Subcutaneous TID WC  . insulin aspart  0-5 Units Subcutaneous QHS  . lactulose  30 g Oral BID  . lamoTRIgine  50 mg Oral BID  . lurasidone  60 mg Oral Q breakfast  . pravastatin  40 mg Oral QHS  . tamsulosin  0.4 mg Oral Daily    ROS: History obtained from the patient  General ROS: fatigue Psychological ROS: depression, night terrors Ophthalmic  ROS: negative for - blurry vision, double vision, eye pain or loss of vision ENT ROS: negative for - epistaxis, nasal discharge, oral lesions, sore throat, tinnitus or vertigo Allergy and Immunology ROS: negative for - hives or itchy/watery eyes Hematological and Lymphatic ROS: negative for - bleeding problems, bruising or swollen lymph nodes Endocrine ROS: negative for - galactorrhea, hair pattern changes, polydipsia/polyuria or temperature intolerance Respiratory ROS: negative for - cough, hemoptysis, shortness of breath or wheezing Cardiovascular ROS: negative for - chest pain, dyspnea on exertion, edema or irregular heartbeat Gastrointestinal ROS: negative for - abdominal pain, diarrhea, hematemesis, nausea/vomiting or stool incontinence Genito-Urinary ROS: negative for - dysuria, hematuria, incontinence or urinary frequency/urgency Musculoskeletal ROS: LE pain Neurological ROS: as noted in HPI Dermatological ROS: negative for rash and skin lesion changes  Physical Examination: Blood pressure (!) 125/48, pulse (!) 59, temperature 97.9  F (36.6 C), temperature source Oral, resp. rate 18, height 5\' 10"  (1.778 m), weight 113.4 kg, SpO2 98 %. Neurological Exam   Mental Status: Alert, oriented, thought content appropriate.  Speech fluent without evidence of aphasia.  Able to follow 3 step commands without difficulty. Attention span and concentration poor  Cranial Nerves: II: Discs flat bilaterally; Visual fields grossly normal, pupils equal, round, reactive to light and accommodation III,IV, VI: ptosis not present, extra-ocular motions intact bilaterally V,VII: left facial droop, facial light touch sensation intact VIII: hearing normal bilaterally IX,X: gag reflex present XI: bilateral shoulder shrug XII: midline tongue extension Motor: Patient able to lift all extremities against gravity with no focal weakness noted.  Mild foot drop noted bilaterally Sensory: Stocking-glove sensory loss  in the lower extremities bilaterally  Deep Tendon Reflexes: 1+ and symmetric with absent AJ's bilaterally Plantars: Right: mute                              Left: mute Cerebellar: Finger-to-nose testing intact bilaterally. Heel to shin testing normal bilaterally Gait: not tested due to safety concerns  Data Reviewed  Laboratory Studies:  Basic Metabolic Panel: Recent Labs  Lab 05/26/18 1111 05/27/18 0605  NA 137 142  K 4.1 3.8  CL 105 108  CO2 26 26  GLUCOSE 349* 140*  BUN 14 11  CREATININE 0.68 0.85  CALCIUM 8.7* 8.7*    Liver Function Tests: Recent Labs  Lab 05/26/18 1111  AST 42*  ALT 20  ALKPHOS 80  BILITOT 2.1*  PROT 5.8*  ALBUMIN 3.3*   No results for input(s): LIPASE, AMYLASE in the last 168 hours. Recent Labs  Lab 05/27/18 1106  AMMONIA 71*    CBC: Recent Labs  Lab 05/26/18 1111 05/26/18 1754 05/27/18 0011 05/27/18 0605 05/27/18 1106  WBC 3.6*  --   --   --   --   NEUTROABS 2.2  --   --   --   --   HGB 9.5* 10.0* 9.9* 10.2* 10.0*  HCT 29.0* 30.3* 29.6* 30.9* 30.0*  MCV 68.5*  --   --   --   --   PLT 104*  --   --   --   --     Cardiac Enzymes: Recent Labs  Lab 05/26/18 1111  TROPONINI <0.03    BNP: Invalid input(s): POCBNP  CBG: Recent Labs  Lab 05/26/18 1109 05/26/18 1813 05/26/18 2214 05/27/18 0743  GLUCAP 320* 94 136* 133*    Microbiology: Results for orders placed or performed during the hospital encounter of 05/26/18  MRSA PCR Screening     Status: None   Collection Time: 05/27/18  6:58 AM  Result Value Ref Range Status   MRSA by PCR NEGATIVE NEGATIVE Final    Comment:        The GeneXpert MRSA Assay (FDA approved for NASAL specimens only), is one component of a comprehensive MRSA colonization surveillance program. It is not intended to diagnose MRSA infection nor to guide or monitor treatment for MRSA infections. Performed at [facility], [address] [place], [place] [address]      Coagulation Studies: No results for input(s): LABPROT, INR in the last 72 hours.  Urinalysis:  Recent Labs  Lab 05/26/18 1416  COLORURINE AMBER*  LABSPEC 1.016  PHURINE 5.0  GLUCOSEU >=500*  HGBUR NEGATIVE  BILIRUBINUR NEGATIVE  KETONESUR NEGATIVE  PROTEINUR NEGATIVE  NITRITE NEGATIVE  LEUKOCYTESUR NEGATIVE  Lipid Panel:    Component Value Date/Time   CHOL 152 04/25/2018 0400   TRIG 103 04/25/2018 0400   HDL 35 (L) 04/25/2018 0400   CHOLHDL 4.3 04/25/2018 0400   VLDL 21 04/25/2018 0400   LDLCALC 96 04/25/2018 0400    HgbA1C:  Lab Results  Component Value Date   HGBA1C 9.1 (H) 04/25/2018    Urine Drug Screen:  No results found for: LABOPIA, COCAINSCRNUR, LABBENZ, AMPHETMU, THCU, LABBARB  Alcohol Level: No results for input(s): ETH in the last 168 hours.  Other results: EKG: paced rhythm at 84 bpm  Imaging: Dg Chest 2 View  Result Date: 05/26/2018 CLINICAL DATA:  Bilateral leg weakness EXAM: CHEST - 2 VIEW COMPARISON:  04/24/2016 FINDINGS: Cardiac shadow remains enlarged. Pacing device is now seen in satisfactory position. Mild central vascular congestion is noted without interstitial edema. No sizable infiltrate or effusion is noted. No bony abnormality is seen. IMPRESSION: Mild vascular congestion without interstitial edema. Electronically Signed   By: Inez Catalina M.D.   On: 05/26/2018 11:48   Ct Head Wo Contrast  Result Date: 05/27/2018 CLINICAL DATA:  Generalized weakness.  Possible stroke. EXAM: CT HEAD WITHOUT CONTRAST TECHNIQUE: Contiguous axial images were obtained from the base of the skull through the vertex without intravenous contrast. COMPARISON:  CT scan of May 26, 2018. FINDINGS: Brain: Mild diffuse cortical atrophy is noted. Mild chronic ischemic white matter disease is noted. No mass effect or midline shift is noted. Ventricular size is within normal limits. There is no evidence of mass lesion, hemorrhage or acute infarction. Vascular: No  hyperdense vessel or unexpected calcification. Skull: Normal. Negative for fracture or focal lesion. Sinuses/Orbits: Stable mucosal thickening and opacification of right maxillary and ethmoid sinuses. Other: None. IMPRESSION: Mild diffuse cortical atrophy. Mild chronic ischemic white matter disease. No acute intracranial abnormality seen. Electronically Signed   By: Marijo Conception, M.D.   On: 05/27/2018 09:54   Ct Head Wo Contrast  Result Date: 05/26/2018 CLINICAL DATA:  Leg weakness with history of remote stroke EXAM: CT HEAD WITHOUT CONTRAST TECHNIQUE: Contiguous axial images were obtained from the base of the skull through the vertex without intravenous contrast. COMPARISON:  04/24/2018 FINDINGS: Brain: Chronic atrophic and ischemic changes are identified. The previously seen hypodensity within the right periventricular white matter is again identified consistent with prior lacunar infarct. Scattered lacunar infarcts are noted within the basal ganglia bilaterally similar to that seen on the prior exam. No findings to suggest acute hemorrhage, acute infarction or space-occupying mass lesion are noted. Vascular: No hyperdense vessel or unexpected calcification. Skull: Normal. Negative for fracture or focal lesion. Sinuses/Orbits: Orbits are within normal limits. The paranasal sinuses demonstrate opacification of the right maxillary antrum. Other: None IMPRESSION: Chronic atrophic and ischemic changes. No acute infarct or hemorrhage is seen. Chronic appearing changes in the right maxillary antrum. Electronically Signed   By: Inez Catalina M.D.   On: 05/26/2018 11:47    Assessment: 77 y.o. male presenting with AMS and left sided weakness.  Head CT performed and shows no acute changes but evidence of chronic infarcts.  MRI of the brain unable to be performed due to pacemaker.  Lab work reviewed and shows an elevated ammonia.  This may very well be the cause of the patient's presentation, not only causing the  altered mental status but also making his previous deficits more apparent.  It may also be exacerbating his psychiatric illness.  Can not rule out recurrent infarct but this is  less likely.   Patient on ASA and a statin prior to admission.   Has been noncompliant with medications prior to placement.    Stroke Risk Factors - carotid stenosis and diabetes mellitus  Plan: 1. HgbA1c, fasting lipid panel 2. PT consult, OT consult, Speech consult 3. Prophylactic therapy-Continue ASA 4. Telemetry monitoring 5. Frequent neuro checks 6. Agree with addressing hyperammonemia 7. Would consider discontinuing medications that are metabolized through the liver, such as statin, adderall and/or allopurinol in an attempt to address hyperammonemia  Alexis Goodell, MD Neurology [phone] 05/27/2018, 1:43 PM

## 2018-05-27 NOTE — NC FL2 (Addendum)
[place] LEVEL OF CARE SCREENING TOOL     IDENTIFICATION  Patient Name: [name] Day Birthdate: Jan 25, 1941 Sex: male Admission Date (Current Location): 05/26/2018  [place] and [place] Number:  Engineering geologist and Address:  [facility], [address], [place], [place] [address]      Provider Number: [id number]  Attending Physician Name and Address:  Dustin Flock, MD  Relative Name and Phone Number:   Keiffer Piper [phone]    Current Level of Care: Hospital Recommended Level of Care:  SNF Prior Approval Number:    Date Approved/Denied:   PASRR Number:    Discharge Plan: SNF    Current Diagnoses: Patient Active Problem List   Diagnosis Date Noted  . Anemia 05/26/2018  . Dyslipidemia associated with type 2 diabetes mellitus ([place]) 05/22/2018  . GERD without esophagitis 05/22/2018  . Somnolence 05/22/2018  . Bipolar 1 disorder ([place]) 05/12/2018  . Frequent falls 04/24/2018  . Expressive aphasia 04/24/2018  . Left-sided Bell's palsy 03/03/2018  . Diabetic peripheral neuropathy ([place]) 03/03/2018  . Abnormal LFTs 09/08/2017  . 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 ED (erectile dysfunction) 09/08/2017  . Environmental allergies 09/08/2017  . Nephrolithiasis 09/08/2017  . Non-cardiac chest pain 09/08/2017  . Rectal bleeding 09/08/2017  . Sinusitis 09/08/2017  . Varicose veins of leg with swelling, bilateral 06/12/2017  . Venous ulcer ([place]) 05/18/2017  . Chronic venous insufficiency 05/18/2017  . Memory disorder 02/12/2017  . Gait abnormality 02/12/2017  . CKD stage 3 due to type 2 diabetes mellitus ([place]) 02/06/2017  . Hyperlipidemia 02/06/2017  . Swelling of limb 02/06/2017  . Lymphedema 02/06/2017  . Heme positive stool 12/04/2016  . Mixed dementia 07/29/2016  . Cardiac pacemaker in situ 04/27/2016  . AV heart block 04/24/2016  . Hyperglycemia 04/24/2016  . Generalized weakness 04/24/2016  . Syncope and  collapse 04/24/2016  . Metal bone fixation hardware in place 04/21/2016  . Bilateral carotid artery stenosis 02/05/2015  . OSA (obstructive sleep apnea) 02/05/2015  . Benign essential hypertension 01/19/2015  . B12 deficiency 05/23/2014  . Morbid obesity with alveolar hypoventilation ([place]) 05/23/2014  . Long-term insulin use ([place]) 05/22/2014  . Type II diabetes mellitus with manifestations, uncontrolled ([place]) 05/22/2014  . Aortic stenosis, moderate 11/14/2013  . ANEMIA-NOS 03/29/2007  . Anxiety 03/29/2007  . DEPRESSION 03/29/2007  . ALLERGIC RHINITIS 03/29/2007  . ASTHMA 03/29/2007  . Osteoarthritis 03/29/2007  . NEPHROLITHIASIS, HX OF 03/29/2007    Orientation RESPIRATION BLADDER Height & Weight     Self, Situation    Incontinent Weight: 250 lb (113.4 kg) Height:  5\' 10"  (177.8 cm)  BEHAVIORAL SYMPTOMS/MOOD NEUROLOGICAL BOWEL NUTRITION STATUS      Incontinent  diabetic diet  AMBULATORY STATUS COMMUNICATION OF NEEDS Skin   Limited Assist Verbally                         Personal Care Assistance Level of Assistance  Bathing, Feeding, Dressing, Total care Bathing Assistance: Limited assistance Feeding assistance: Independent Dressing Assistance: Limited assistance Total Care Assistance: Limited assistance   Functional Limitations Info  Sight, Hearing, Speech Sight Info: Adequate Hearing Info: Adequate Speech Info: Adequate    SPECIAL CARE FACTORS FREQUENCY                       Contractures     Additional Factors Info  Code Status, Allergies Code Status Info: DNR Allergies Info: Lisinopril, Penicillins, Clindamycin/lincomycin  Current Medications (05/27/2018):  This is the current hospital active medication list Current Facility-Administered Medications  Medication Dose Route Frequency Provider Last Rate Last Dose  . 0.9 %  sodium chloride infusion   Intravenous Continuous Saundra Shelling, MD 75 mL/hr at 05/27/18 7106    . acetaminophen  (TYLENOL) tablet 650 mg  650 mg Oral Q6H PRN Saundra Shelling, MD   650 mg at 05/27/18 0810   Or  . acetaminophen (TYLENOL) suppository 650 mg  650 mg Rectal Q6H PRN Saundra Shelling, MD      . ALPRAZolam Duanne Moron) tablet 0.5 mg  0.5 mg Oral QHS PRN Saundra Shelling, MD   0.5 mg at 05/26/18 2304  . amphetamine-dextroamphetamine (ADDERALL) tablet 20 mg  20 mg Oral Daily Pyreddy, Pavan, MD      . famotidine (PEPCID) IVPB 20 mg premix  20 mg Intravenous Q12H Saundra Shelling, MD   Stopped at 05/26/18 2331  . Influenza vac split quadrivalent PF (FLUZONE HIGH-DOSE) injection 0.5 mL  0.5 mL Intramuscular Tomorrow-1000 Pyreddy, Pavan, MD      . insulin aspart (novoLOG) injection 0-15 Units  0-15 Units Subcutaneous TID WC Saundra Shelling, MD   2 Units at 05/27/18 0809  . insulin aspart (novoLOG) injection 0-5 Units  0-5 Units Subcutaneous QHS Pyreddy, Pavan, MD      . ondansetron (ZOFRAN) tablet 4 mg  4 mg Oral Q6H PRN Pyreddy, Reatha Harps, MD       Or  . ondansetron (ZOFRAN) injection 4 mg  4 mg Intravenous Q6H PRN Pyreddy, Pavan, MD      . pravastatin (PRAVACHOL) tablet 40 mg  40 mg Oral QHS Saundra Shelling, MD   40 mg at 05/26/18 2256  . senna-docusate (Senokot-S) tablet 1 tablet  1 tablet Oral QHS PRN Pyreddy, Reatha Harps, MD      . tamsulosin (FLOMAX) capsule 0.4 mg  0.4 mg Oral Daily Pyreddy, Reatha Harps, MD         Discharge Medications: Please see discharge summary for a list of discharge medications.  Relevant Imaging Results:  Relevant Lab Results:   Additional Information SSN [id number]  Joana Reamer, [place]

## 2018-05-27 NOTE — Progress Notes (Signed)
[place] at [facility]                                                                                                                                                                                  Patient Demographics   [name] Day, is a 77 y.o. male, DOB - 10-30-1940, HYQ:[id number]  Admit date - 05/26/2018   Admitting Physician Saundra Shelling, MD  Outpatient Primary MD for the patient is Idelle Crouch, MD   LOS - 1  Subjective: Patient admitted with generalized weakness and its more sleepiness according to the family.  He also had some slurred speech Patient this morning again is sleepy falls asleep during my conversation with   Review of Systems:   CONSTITUTIONAL: Limited due to his mental status   Vitals:   Vitals:   05/26/18 1733 05/26/18 2104 05/27/18 0526 05/27/18 1238  BP: (!) 129/59 132/62 (!) 132/47 (!) 125/48  Pulse: 61 68 (!) 59 (!) 59  Resp: 19 20 20 18   Temp: (!) 97.5 F (36.4 C) 97.6 F (36.4 C) 97.9 F (36.6 C) 97.9 F (36.6 C)  TempSrc: Oral Oral Oral Oral  SpO2: 96% 97% 100% 98%  Weight:      Height:        Wt Readings from Last 3 Encounters:  05/26/18 113.4 kg  05/12/18 117.3 kg  05/05/18 117.2 kg     Intake/Output Summary (Last 24 hours) at 05/27/2018 1336 Last data filed at 05/27/2018 9528 Gross per 24 hour  Intake 1035.76 ml  Output 775 ml  Net 260.76 ml    Physical Exam:   GENERAL: Pleasant-appearing in no apparent distress.  HEAD, EYES, EARS, NOSE AND THROAT: Atraumatic, normocephalic. Extraocular muscles are intact. Pupils equal and reactive to light. Sclerae anicteric. No conjunctival injection. No oro-pharyngeal erythema.  NECK: Supple. There is no jugular venous distention. No bruits, no lymphadenopathy, no thyromegaly.  HEART: Regular rate and rhythm,. No murmurs, no rubs, no clicks.  LUNGS: Clear to auscultation bilaterally. No rales or rhonchi. No wheezes.  ABDOMEN: Soft, flat, nontender,  nondistended. Has good bowel sounds. No hepatosplenomegaly appreciated.  EXTREMITIES: No evidence of any cyanosis, clubbing, or peripheral edema.  +2 pedal and radial pulses bilaterally.  NEUROLOGIC: Patient falls asleep easily SKIN: Moist and warm with no rashes appreciated.  Psych: Not anxious, depressed LN: No inguinal LN enlargement    Antibiotics   Anti-infectives (From admission, onward)   None      Medications   Scheduled Meds: . amphetamine-dextroamphetamine  20 mg Oral Daily  . famotidine  20 mg Oral BID  . insulin aspart  0-15 Units Subcutaneous TID WC  . insulin  aspart  0-5 Units Subcutaneous QHS  . lactulose  30 g Oral BID  . lamoTRIgine  50 mg Oral BID  . lurasidone  60 mg Oral Q breakfast  . pravastatin  40 mg Oral QHS  . tamsulosin  0.4 mg Oral Daily   Continuous Infusions: . sodium chloride 75 mL/hr at 05/27/18 0822   PRN Meds:.acetaminophen **OR** acetaminophen, ALPRAZolam, ondansetron **OR** ondansetron (ZOFRAN) IV, senna-docusate   Data Review:   Micro Results Recent Results (from the past 240 hour(s))  MRSA PCR Screening     Status: None   Collection Time: 05/27/18  6:58 AM  Result Value Ref Range Status   MRSA by PCR NEGATIVE NEGATIVE Final    Comment:        The GeneXpert MRSA Assay (FDA approved for NASAL specimens only), is one component of a comprehensive MRSA colonization surveillance program. It is not intended to diagnose MRSA infection nor to guide or monitor treatment for MRSA infections. Performed at [facility], [address], [place], [place] [address]     Radiology Reports Dg Chest 2 View  Result Date: 05/26/2018 CLINICAL DATA:  Bilateral leg weakness EXAM: CHEST - 2 VIEW COMPARISON:  04/24/2016 FINDINGS: Cardiac shadow remains enlarged. Pacing device is now seen in satisfactory position. Mild central vascular congestion is noted without interstitial edema. No sizable infiltrate or effusion is noted. No bony  abnormality is seen. IMPRESSION: Mild vascular congestion without interstitial edema. Electronically Signed   By: Inez Catalina M.D.   On: 05/26/2018 11:48   Ct Head Wo Contrast  Result Date: 05/27/2018 CLINICAL DATA:  Generalized weakness.  Possible stroke. EXAM: CT HEAD WITHOUT CONTRAST TECHNIQUE: Contiguous axial images were obtained from the base of the skull through the vertex without intravenous contrast. COMPARISON:  CT scan of May 26, 2018. FINDINGS: Brain: Mild diffuse cortical atrophy is noted. Mild chronic ischemic white matter disease is noted. No mass effect or midline shift is noted. Ventricular size is within normal limits. There is no evidence of mass lesion, hemorrhage or acute infarction. Vascular: No hyperdense vessel or unexpected calcification. Skull: Normal. Negative for fracture or focal lesion. Sinuses/Orbits: Stable mucosal thickening and opacification of right maxillary and ethmoid sinuses. Other: None. IMPRESSION: Mild diffuse cortical atrophy. Mild chronic ischemic white matter disease. No acute intracranial abnormality seen. Electronically Signed   By: Marijo Conception, M.D.   On: 05/27/2018 09:54   Ct Head Wo Contrast  Result Date: 05/26/2018 CLINICAL DATA:  Leg weakness with history of remote stroke EXAM: CT HEAD WITHOUT CONTRAST TECHNIQUE: Contiguous axial images were obtained from the base of the skull through the vertex without intravenous contrast. COMPARISON:  04/24/2018 FINDINGS: Brain: Chronic atrophic and ischemic changes are identified. The previously seen hypodensity within the right periventricular white matter is again identified consistent with prior lacunar infarct. Scattered lacunar infarcts are noted within the basal ganglia bilaterally similar to that seen on the prior exam. No findings to suggest acute hemorrhage, acute infarction or space-occupying mass lesion are noted. Vascular: No hyperdense vessel or unexpected calcification. Skull: Normal. Negative  for fracture or focal lesion. Sinuses/Orbits: Orbits are within normal limits. The paranasal sinuses demonstrate opacification of the right maxillary antrum. Other: None IMPRESSION: Chronic atrophic and ischemic changes. No acute infarct or hemorrhage is seen. Chronic appearing changes in the right maxillary antrum. Electronically Signed   By: Inez Catalina M.D.   On: 05/26/2018 11:47     CBC Recent Labs  Lab 05/26/18 1111 05/26/18 1754  05/27/18 0011 05/27/18 0605 05/27/18 1106  WBC 3.6*  --   --   --   --   HGB 9.5* 10.0* 9.9* 10.2* 10.0*  HCT 29.0* 30.3* 29.6* 30.9* 30.0*  PLT 104*  --   --   --   --   MCV 68.5*  --   --   --   --   MCH 22.5*  --   --   --   --   MCHC 32.8  --   --   --   --   RDW 15.9*  --   --   --   --   LYMPHSABS 0.9*  --   --   --   --   MONOABS 0.4  --   --   --   --   EOSABS 0.1  --   --   --   --   BASOSABS 0.0  --   --   --   --     Chemistries  Recent Labs  Lab 05/26/18 1111 05/27/18 0605  NA 137 142  K 4.1 3.8  CL 105 108  CO2 26 26  GLUCOSE 349* 140*  BUN 14 11  CREATININE 0.68 0.85  CALCIUM 8.7* 8.7*  AST 42*  --   ALT 20  --   ALKPHOS 80  --   BILITOT 2.1*  --    ------------------------------------------------------------------------------------------------------------------ estimated creatinine clearance is 91.8 mL/min (by C-G formula based on SCr of 0.85 mg/dL). ------------------------------------------------------------------------------------------------------------------ No results for input(s): HGBA1C in the last 72 hours. ------------------------------------------------------------------------------------------------------------------ No results for input(s): CHOL, HDL, LDLCALC, TRIG, CHOLHDL, LDLDIRECT in the last 72 hours. ------------------------------------------------------------------------------------------------------------------ No results for input(s): TSH, T4TOTAL, T3FREE, THYROIDAB in the last 72 hours.  Invalid  input(s): FREET3 ------------------------------------------------------------------------------------------------------------------ Recent Labs    05/27/18 1106  FOLATE 9.3  FERRITIN 147  TIBC 282  IRON 98    Coagulation profile No results for input(s): INR, PROTIME in the last 168 hours.  No results for input(s): DDIMER in the last 72 hours.  Cardiac Enzymes Recent Labs  Lab 05/26/18 1111  TROPONINI <0.03   ------------------------------------------------------------------------------------------------------------------ Invalid input(s): POCBNP    Assessment & Plan   77 year old elderly male patient from [facility] assisted living facility with history of CVA, Bell's palsy, diabetes mellitus type 2,, carotid artery occlusion presented to the emergency room for weakness and fatigue  -Acute encephalopathy Etiology unclear Previous history of stroke unable to have MRI Neurology ABG shows no evidence of CO2 retention Ammonia level high we will start him on lactulose No evidence infection urinalysis chest x-ray negative Anemia not does not seem to be the cause for his symptoms    -Anemia with guaiac positive stool Hemoglobin is 10 this is not causing his symptoms appreciate GI input    -History of CVA and Bell's palsy Repeat CT head no evidence of stroke 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 and MRI of the brain cannot be done Continue statin  -Uncontrolled diabetes mellitus Sliding scale coverage with insulin Monitor blood sugars sugars now improved  -Sleep apnea start CPAP at nighttime  -DVT prophylaxis Of blood thinner meds for now secondary to anemia with guaiac positive stool Sequential compression device to lower extremities          Code Status Orders  (From admission, onward)         Start     Ordered   05/26/18 1706  Do not attempt resuscitation (DNR)  Continuous    Question Answer Comment  In the  event of cardiac or respiratory ARREST Do not call a "code blue"   In the event of cardiac or respiratory ARREST Do not perform Intubation, CPR, defibrillation or ACLS   In the event of cardiac or respiratory ARREST Use medication by any route, position, wound care, and other measures to relive pain and suffering. May use oxygen, suction and manual treatment of airway obstruction as needed for comfort.      05/26/18 1705        Code Status History    Date Active Date Inactive Code Status Order ID Comments User Context   04/25/2018 1126 04/27/2018 2050 DNR [id number]  Bettey Costa, MD Inpatient   04/25/2018 0051 04/25/2018 1126 Full Code [id number]  Lance Coon, MD Inpatient    Advance Directive Documentation     Most Recent Value  Type of Advance Directive  Out of facility DNR (pink MOST or yellow form)  Pre-existing out of facility DNR order (yellow form or pink MOST form)  -  "MOST" Form in Place?  -           Consults neurology   DVT Prophylaxis SCDs  Lab Results  Component Value Date   PLT 104 (L) 05/26/2018     Time Spent in minutes   56min  Greater than 50% of time spent in care coordination and counseling patient regarding the condition and plan of care.   Dustin Flock M.D on 05/27/2018 at 1:36 PM  Between 7am to 6pm - Pager - [phone]  After 6pm go to [email or web address] - Proofreader  [employer]   Office  [phone]

## 2018-05-28 LAB — CBC
HCT: 30 % — ABNORMAL LOW (ref 40.0–52.0)
Hemoglobin: 10.1 g/dL — ABNORMAL LOW (ref 13.0–18.0)
MCH: 22.6 pg — ABNORMAL LOW (ref 26.0–34.0)
MCHC: 33.5 g/dL (ref 32.0–36.0)
MCV: 67.5 fL — AB (ref 80.0–100.0)
PLATELETS: 101 10*3/uL — AB (ref 150–440)
RBC: 4.44 MIL/uL (ref 4.40–5.90)
RDW: 15.7 % — ABNORMAL HIGH (ref 11.5–14.5)
WBC: 4 10*3/uL (ref 3.8–10.6)

## 2018-05-28 LAB — CELIAC DISEASE PANEL
Endomysial Ab, IgA: NEGATIVE
IGA: 273 mg/dL (ref 61–437)
Tissue Transglutaminase Ab, IgA: <2 U/mL (ref 0–3)

## 2018-05-28 LAB — VITAMIN D 25 HYDROXY (VIT D DEFICIENCY, FRACTURES): VIT D 25 HYDROXY: 17.7 ng/mL — AB (ref 30.0–100.0)

## 2018-05-28 LAB — BASIC METABOLIC PANEL
ANION GAP: 9 (ref 5–15)
BUN: 13 mg/dL (ref 8–23)
CALCIUM: 8.6 mg/dL — AB (ref 8.9–10.3)
CO2: 24 mmol/L (ref 22–32)
Chloride: 107 mmol/L (ref 98–111)
Creatinine, Ser: 0.74 mg/dL (ref 0.61–1.24)
Glucose, Bld: 157 mg/dL — ABNORMAL HIGH (ref 70–99)
POTASSIUM: 4 mmol/L (ref 3.5–5.1)
SODIUM: 140 mmol/L (ref 135–145)

## 2018-05-28 LAB — GLUCOSE, CAPILLARY
GLUCOSE-CAPILLARY: 161 mg/dL — AB (ref 70–99)
GLUCOSE-CAPILLARY: 139 mg/dL — AB (ref 70–99)
Glucose-Capillary: 206 mg/dL — ABNORMAL HIGH (ref 70–99)

## 2018-05-28 LAB — AMMONIA: Ammonia: 38 umol/L — ABNORMAL HIGH (ref 9–35)

## 2018-05-28 MED ORDER — MELOXICAM 7.5 MG PO TABS
15.0000 mg | ORAL_TABLET | Freq: Every day | ORAL | Status: DC
  Administered 2018-05-28 – 2018-06-01 (×5): 15 mg via ORAL
  Filled 2018-05-28 (×5): qty 2

## 2018-05-28 MED ORDER — DICLOFENAC SODIUM 1 % TD GEL
4.0000 g | Freq: Three times a day (TID) | TRANSDERMAL | Status: DC
  Administered 2018-05-28 – 2018-06-01 (×9): 4 g via TOPICAL
  Filled 2018-05-28: qty 100

## 2018-05-28 MED ORDER — NYSTATIN 100000 UNIT/GM EX CREA
TOPICAL_CREAM | Freq: Two times a day (BID) | CUTANEOUS | Status: DC
  Administered 2018-05-28: 23:00:00 via TOPICAL
  Administered 2018-05-28: 1 via TOPICAL
  Administered 2018-05-29 – 2018-06-01 (×7): via TOPICAL
  Filled 2018-05-28 (×3): qty 15

## 2018-05-28 MED ORDER — QUETIAPINE FUMARATE 25 MG PO TABS
25.0000 mg | ORAL_TABLET | Freq: Every day | ORAL | Status: DC
  Administered 2018-05-28: 25 mg via ORAL
  Filled 2018-05-28: qty 1

## 2018-05-28 MED ORDER — LIDOCAINE 5 % EX PTCH
1.0000 | MEDICATED_PATCH | CUTANEOUS | Status: DC
  Administered 2018-05-28 – 2018-06-01 (×5): 1 via TRANSDERMAL
  Filled 2018-05-28 (×5): qty 1

## 2018-05-28 MED ORDER — ZINC OXIDE 40 % EX OINT
TOPICAL_OINTMENT | Freq: Three times a day (TID) | CUTANEOUS | Status: DC | PRN
  Administered 2018-06-01: 02:00:00 via TOPICAL
  Filled 2018-05-28: qty 113

## 2018-05-28 NOTE — Clinical Social Work Note (Signed)
Patient's pasrr is pending as he has an expired Level 2 pasrr due to his bipolar. Pasrr will have to be received prior to patient discharging. Patient has accepted bed offer from [facility]. Patient will have to take home cpap to use at [facility]. Shela Leff MSW,LCSW [phone]

## 2018-05-28 NOTE — Care Management (Signed)
RNCM Consult for LT[facility] entered in error.  Plan for patient to discharge to SNF. CSW facilitating.  RNCM signing off.  Please re consult if indicated.

## 2018-05-28 NOTE — Progress Notes (Signed)
[place] at [facility]                                                                                                                                                                                  Patient Demographics   Woodrow Drab, is a 77 y.o. male, DOB - May 18, 1941, MGQ:[id number]  Admit date - 05/26/2018   Admitting Physician Saundra Shelling, MD  Outpatient Primary MD for the patient is Doy Hutching Leonie Douglas, MD   LOS - 2  Subjective: Patient much more awake today this morning.  According to family he did not sleep well last night Patient complains of some pain in his right hip area he has had a CT scan of the hip last month this his pain has been chronic  Review of Systems:   CONSTITUTIONAL: Limited due to his mental status   Vitals:   Vitals:   05/27/18 2021 05/27/18 2209 05/28/18 0340 05/28/18 1148  BP: (!) 132/57  (!) 133/54 (!) 145/52  Pulse: (!) 59  62 61  Resp: 20  20   Temp: 98 F (36.7 C)  98 F (36.7 C)   TempSrc: Oral  Oral   SpO2: 99% 99% 99% 98%  Weight:      Height:        Wt Readings from Last 3 Encounters:  05/26/18 113.4 kg  05/12/18 117.3 kg  05/05/18 117.2 kg     Intake/Output Summary (Last 24 hours) at 05/28/2018 1644 Last data filed at 05/28/2018 1300 Gross per 24 hour  Intake -  Output 1450 ml  Net -1450 ml    Physical Exam:   GENERAL: Pleasant-appearing in no apparent distress.  HEAD, EYES, EARS, NOSE AND THROAT: Atraumatic, normocephalic. Extraocular muscles are intact. Pupils equal and reactive to light. Sclerae anicteric. No conjunctival injection. No oro-pharyngeal erythema.  NECK: Supple. There is no jugular venous distention. No bruits, no lymphadenopathy, no thyromegaly.  HEART: Regular rate and rhythm,. No murmurs, no rubs, no clicks.  LUNGS: Clear to auscultation bilaterally. No rales or rhonchi. No wheezes.  ABDOMEN: Soft, flat, nontender, nondistended. Has good bowel sounds. No  hepatosplenomegaly appreciated.  EXTREMITIES: No evidence of any cyanosis, clubbing, or peripheral edema.  +2 pedal and radial pulses bilaterally.  NEUROLOGIC: Patient falls asleep easily SKIN: Moist and warm with no rashes appreciated.  Psych: Not anxious, depressed LN: No inguinal LN enlargement    Antibiotics   Anti-infectives (From admission, onward)   None      Medications   Scheduled Meds: . diclofenac sodium  4 g Topical TID  . famotidine  20 mg Oral BID  . insulin aspart  0-15 Units Subcutaneous TID  WC  . insulin aspart  0-5 Units Subcutaneous QHS  . lactulose  30 g Oral BID  . lamoTRIgine  50 mg Oral BID  . lidocaine  1 patch Transdermal Q24H  . lurasidone  60 mg Oral Q breakfast  . meloxicam  15 mg Oral Daily  . nystatin cream   Topical BID  . QUEtiapine  25 mg Oral QHS  . tamsulosin  0.4 mg Oral Daily   Continuous Infusions:  PRN Meds:.acetaminophen **OR** acetaminophen, ALPRAZolam, liver oil-zinc oxide, ondansetron **OR** ondansetron (ZOFRAN) IV, senna-docusate   Data Review:   Micro Results Recent Results (from the past 240 hour(s))  MRSA PCR Screening     Status: None   Collection Time: 05/27/18  6:58 AM  Result Value Ref Range Status   MRSA by PCR NEGATIVE NEGATIVE Final    Comment:        The GeneXpert MRSA Assay (FDA approved for NASAL specimens only), is one component of a comprehensive MRSA colonization surveillance program. It is not intended to diagnose MRSA infection nor to guide or monitor treatment for MRSA infections. Performed at [facility], [address], [place], [place] [address]     Radiology Reports Dg Chest 2 View  Result Date: 05/26/2018 CLINICAL DATA:  Bilateral leg weakness EXAM: CHEST - 2 VIEW COMPARISON:  04/24/2016 FINDINGS: Cardiac shadow remains enlarged. Pacing device is now seen in satisfactory position. Mild central vascular congestion is noted without interstitial edema. No sizable infiltrate or  effusion is noted. No bony abnormality is seen. IMPRESSION: Mild vascular congestion without interstitial edema. Electronically Signed   By: Inez Catalina M.D.   On: 05/26/2018 11:48   Ct Head Wo Contrast  Result Date: 05/27/2018 CLINICAL DATA:  Generalized weakness.  Possible stroke. EXAM: CT HEAD WITHOUT CONTRAST TECHNIQUE: Contiguous axial images were obtained from the base of the skull through the vertex without intravenous contrast. COMPARISON:  CT scan of May 26, 2018. FINDINGS: Brain: Mild diffuse cortical atrophy is noted. Mild chronic ischemic white matter disease is noted. No mass effect or midline shift is noted. Ventricular size is within normal limits. There is no evidence of mass lesion, hemorrhage or acute infarction. Vascular: No hyperdense vessel or unexpected calcification. Skull: Normal. Negative for fracture or focal lesion. Sinuses/Orbits: Stable mucosal thickening and opacification of right maxillary and ethmoid sinuses. Other: None. IMPRESSION: Mild diffuse cortical atrophy. Mild chronic ischemic white matter disease. No acute intracranial abnormality seen. Electronically Signed   By: Marijo Conception, M.D.   On: 05/27/2018 09:54   Ct Head Wo Contrast  Result Date: 05/26/2018 CLINICAL DATA:  Leg weakness with history of remote stroke EXAM: CT HEAD WITHOUT CONTRAST TECHNIQUE: Contiguous axial images were obtained from the base of the skull through the vertex without intravenous contrast. COMPARISON:  04/24/2018 FINDINGS: Brain: Chronic atrophic and ischemic changes are identified. The previously seen hypodensity within the right periventricular white matter is again identified consistent with prior lacunar infarct. Scattered lacunar infarcts are noted within the basal ganglia bilaterally similar to that seen on the prior exam. No findings to suggest acute hemorrhage, acute infarction or space-occupying mass lesion are noted. Vascular: No hyperdense vessel or unexpected  calcification. Skull: Normal. Negative for fracture or focal lesion. Sinuses/Orbits: Orbits are within normal limits. The paranasal sinuses demonstrate opacification of the right maxillary antrum. Other: None IMPRESSION: Chronic atrophic and ischemic changes. No acute infarct or hemorrhage is seen. Chronic appearing changes in the right maxillary antrum. Electronically Signed   By:  Inez Catalina M.D.   On: 05/26/2018 11:47     CBC Recent Labs  Lab 05/26/18 1111 05/26/18 1754 05/27/18 0011 05/27/18 0605 05/27/18 1106 05/28/18 0640  WBC 3.6*  --   --   --   --  4.0  HGB 9.5* 10.0* 9.9* 10.2* 10.0* 10.1*  HCT 29.0* 30.3* 29.6* 30.9* 30.0* 30.0*  PLT 104*  --   --   --   --  101*  MCV 68.5*  --   --   --   --  67.5*  MCH 22.5*  --   --   --   --  22.6*  MCHC 32.8  --   --   --   --  33.5  RDW 15.9*  --   --   --   --  15.7*  LYMPHSABS 0.9*  --   --   --   --   --   MONOABS 0.4  --   --   --   --   --   EOSABS 0.1  --   --   --   --   --   BASOSABS 0.0  --   --   --   --   --     Chemistries  Recent Labs  Lab 05/26/18 1111 05/27/18 0605 05/28/18 0640  NA 137 142 140  K 4.1 3.8 4.0  CL 105 108 107  CO2 26 26 24   GLUCOSE 349* 140* 157*  BUN 14 11 13   CREATININE 0.68 0.85 0.74  CALCIUM 8.7* 8.7* 8.6*  AST 42*  --   --   ALT 20  --   --   ALKPHOS 80  --   --   BILITOT 2.1*  --   --    ------------------------------------------------------------------------------------------------------------------ estimated creatinine clearance is 97.6 mL/min (by C-G formula based on SCr of 0.74 mg/dL). ------------------------------------------------------------------------------------------------------------------ No results for input(s): HGBA1C in the last 72 hours. ------------------------------------------------------------------------------------------------------------------ No results for input(s): CHOL, HDL, LDLCALC, TRIG, CHOLHDL, LDLDIRECT in the last 72  hours. ------------------------------------------------------------------------------------------------------------------ No results for input(s): TSH, T4TOTAL, T3FREE, THYROIDAB in the last 72 hours.  Invalid input(s): FREET3 ------------------------------------------------------------------------------------------------------------------ Recent Labs    05/27/18 0605 05/27/18 1106  VITAMINB12 247  --   FOLATE  --  9.3  FERRITIN  --  147  TIBC  --  282  IRON  --  98    Coagulation profile No results for input(s): INR, PROTIME in the last 168 hours.  No results for input(s): DDIMER in the last 72 hours.  Cardiac Enzymes Recent Labs  Lab 05/26/18 1111  TROPONINI <0.03   ------------------------------------------------------------------------------------------------------------------ Invalid input(s): POCBNP    Assessment & Plan   77 year old elderly male patient from [facility] assisted living facility with history of CVA, Bell's palsy, diabetes mellitus type 2,, carotid artery occlusion presented to the emergency room for weakness and fatigue  -Acute encephalopathy Etiology unclear Previous history of stroke unable to have MRI Neurology consult appreciated ABG shows no evidence of CO2 retention Ammonia level high treated with lactulose ammonia level now decrease No evidence infection urinalysis chest x-ray negative Anemia not does not seem to be the cause for his symptoms Patient may be sundowning I discussed with the family to try Seroquel at nighttime   -Anemia with guaiac positive stool Hemoglobin is 10 this is not causing his symptoms appreciate GI input Hemoglobin stable   -History of CVA and Bell's palsy Repeat CT head no evidence of stroke Patient has a pacemaker  as well as metal in the retro-orbital area and MRI of the brain cannot be done Statin discontinued due to elevated ammonia as per recommendation per neurology  -Uncontrolled diabetes  mellitus Sliding scale coverage with insulin Monitor blood sugars sugars now improved  -Sleep apnea start CPAP at nighttime  -DVT prophylaxis Of blood thinner meds for now secondary to anemia with guaiac positive stool Sequential compression device to lower extremities   -Patient will need placement into a skilled nursing facility       Code Status Orders  (From admission, onward)         Start     Ordered   05/26/18 1706  Do not attempt resuscitation (DNR)  Continuous    Question Answer Comment  In the event of cardiac or respiratory ARREST Do not call a "code blue"   In the event of cardiac or respiratory ARREST Do not perform Intubation, CPR, defibrillation or ACLS   In the event of cardiac or respiratory ARREST Use medication by any route, position, wound care, and other measures to relive pain and suffering. May use oxygen, suction and manual treatment of airway obstruction as needed for comfort.      05/26/18 1705        Code Status History    Date Active Date Inactive Code Status Order ID Comments User Context   04/25/2018 1126 04/27/2018 2050 DNR [id number]  Bettey Costa, MD Inpatient   04/25/2018 0051 04/25/2018 1126 Full Code [id number]  Lance Coon, MD Inpatient    Advance Directive Documentation     Most Recent Value  Type of Advance Directive  Out of facility DNR (pink MOST or yellow form)  Pre-existing out of facility DNR order (yellow form or pink MOST form)  -  "MOST" Form in Place?  -           Consults neurology   DVT Prophylaxis SCDs  Lab Results  Component Value Date   PLT 101 (L) 05/28/2018     Time Spent in minutes   58min  Greater than 50% of time spent in care coordination and counseling patient regarding the condition and plan of care.   Dustin Flock M.D on 05/28/2018 at 4:44 PM  Between 7am to 6pm - Pager - [phone]  After 6pm go to [email or web address] - Proofreader  [employer]   Office  [phone]

## 2018-05-28 NOTE — Evaluation (Signed)
Occupational Therapy Evaluation Patient Details Name: [name] Day MRN: [id number] DOB: Feb 19, 1941 Today's Date: 05/28/2018    History of Present Illness  [name] Day  is a 77 y.o. male with a known history of CVA in the past, left-sided Bell's palsy left lower extremity weakness from prior CVA, dementia, carotid artery occlusion who is a resident of [facility] assisted living facility was brought to the emergency room today for generalized weakness.  Patient was found to be more weak according to the family.  He usually uses a walker to ambulate at the facility.  Patient has generalized fatigue.  He was evaluated in the emergency room hemoglobin is 9.5.  Usually baseline hemoglobin is around 11.5-12.  Patient is on aspirin as well as Motrin at the facility.  Patient states he had a colonoscopy done in February which was a normal study.  Stool guaiac was positive in the emergency room.  Initially was also worked up for stroke in the emergency room with a CT head which showed no acute abnormality except for old strokes.    Clinical Impression   Pt seen for OT evaluation this date. Prior to hospital admission, pt was living at [facility] ALF for approx 2 weeks, ambulating with RW, and getting assist (per son's report) for bathing, dressing. Pt denies difficulty with toileting or hygiene prior to admission, however, son verbalizes skin breakdown due to poor hygiene prior to admission. Son also notes pt has significant sleep routine difficulties, often with pt reaching out by email or phone to family members throughout the night. Pt also endorses feeling tired and having a hard time with sleep as well. Currently pt demonstrates impairments in cognition (safety awareness, short term memory, slow processing/sequencing, need for cues), balance, activity tolerance, and BLE strength requiring min-mod assist for bed mobility, mod-max assist for seated LB ADL, and max assist for toileting hygiene. Pt  instructed in bed mobility and functional transfer training with emphasis on sequencing to improve safety and independence using RW. Pt/son educated in role of OT and goals of therapy. Pt would benefit from skilled OT to address noted impairments and functional limitations (see below for any additional details) in order to maximize safety and independence while minimizing falls risk and caregiver burden.  Upon hospital discharge, recommend pt discharge to [place].    Follow Up Recommendations  SNF    Equipment Recommendations  3 in 1 bedside commode    Recommendations for Other Services       Precautions / Restrictions Precautions Precautions: Fall Restrictions Weight Bearing Restrictions: No      Mobility Bed Mobility Overal bed mobility: Needs Assistance Bed Mobility: Supine to Sit;Sit to Supine     Supine to sit: Min assist;HOB elevated Sit to supine: Mod assist   General bed mobility comments: BLE mgt for transitions, cues to utilize bed rail and additional time/effort for pt to initiate and perform  Transfers Overall transfer level: Needs assistance Equipment used: Rolling walker (2 wheeled) Transfers: Sit to/from Stand Sit to Stand: From elevated surface;Min assist;+2 safety/equipment         General transfer comment: min A x1 for standing, with instruction/cues for hand/foot placement and to scoot EOB prior to attempt, +2 available for safety but no physical assist required    Balance Overall balance assessment: Needs assistance Sitting-balance support: No upper extremity supported Sitting balance-Leahy Scale: Good     Standing balance support: Bilateral upper extremity supported Standing balance-Leahy Scale: Fair  ADL either performed or assessed with clinical judgement   ADL Overall ADL's : Needs assistance/impaired Eating/Feeding: Independent;Bed level   Grooming: Bed level;Independent   Upper Body Bathing:  Sitting;Minimal assistance;Cueing for safety   Lower Body Bathing: Sit to/from stand;Maximal assistance;Moderate assistance;Cueing for safety   Upper Body Dressing : Sitting;Minimal assistance;Cueing for safety   Lower Body Dressing: Sit to/from stand;Moderate assistance;Maximal assistance   Toilet Transfer: RW;Stand-pivot;Minimal assistance;BSC;Cueing for safety;Cueing for sequencing   Toileting- Clothing Manipulation and Hygiene: Maximal assistance;Sit to/from stand               Vision Baseline Vision/History: Wears glasses Wears Glasses: At all times Patient Visual Report: No change from baseline       Perception     Praxis      Pertinent Vitals/Pain Pain Assessment: 0-10 Pain Score: 8  Pain Location: R hip pain Pain Descriptors / Indicators: Aching Pain Intervention(s): Limited activity within patient's tolerance;Monitored during session;Repositioned     Hand Dominance Right   Extremity/Trunk Assessment Upper Extremity Assessment Upper Extremity Assessment: Overall WFL for tasks assessed   Lower Extremity Assessment Lower Extremity Assessment: Generalized weakness;Defer to PT evaluation LLE Deficits / Details: Generally deconditioned bilateral LE with only focal weakness being 3+/5 L ankle dorsiflexion   Cervical / Trunk Assessment Cervical / Trunk Assessment: Normal   Communication Communication Communication: No difficulties   Cognition Arousal/Alertness: Awake/alert Behavior During Therapy: WFL for tasks assessed/performed Overall Cognitive Status: Impaired/Different from baseline Area of Impairment: Orientation;Memory;Problem solving;Safety/judgement                 Orientation Level: Disoriented to;Time;Situation   Memory: Decreased short-term memory(per son, pt unable to recall admission)   Safety/Judgement: Decreased awareness of safety;Decreased awareness of deficits   Problem Solving: Slow processing;Difficulty sequencing;Requires  verbal cues General Comments: intermittent confusion per son, pt able to follow simple commands, needing additional cues and time to respond to higher level commands   General Comments       Exercises Other Exercises Other Exercises: pt/family education in role of OT Other Exercises: pt/family education in goals for pt based on functional performance and need for additional skilled OT services Other Exercises: functional transfer training in preparation for toilet transfers   Shoulder Instructions      Home Living Family/patient expects to be discharged to:: Skilled nursing facility                                 Additional Comments: Pt has most recently been at [facility] ALF      Prior Functioning/Environment Level of Independence: Needs assistance  Gait / Transfers Assistance Needed: Pt was recently ambulatory around [facility] with rolling walker ADL's / Homemaking Assistance Needed: Per pt/son, pt was requiring assist for LB dressing, assist for bathing (per son), and per son has difficulty with toileting hygiene (leading to a rash/skin breakdown prior to admission)   Comments: Pt endorses at least 3 falls in past 12 months, son indicated 4+        OT Problem List: Decreased strength;Decreased knowledge of use of DME or AE;Decreased activity tolerance;Decreased cognition;Pain;Impaired balance (sitting and/or standing);Decreased safety awareness      OT Treatment/Interventions: Self-care/ADL training;Balance training;Therapeutic exercise;Therapeutic activities;DME and/or AE instruction;Patient/family education;Cognitive remediation/compensation    OT Goals(Current goals can be found in the care plan section) Acute Rehab OT Goals Patient Stated Goal: return to PLOF OT Goal Formulation: With patient/family Time For Goal  Achievement: 06/11/18 Potential to Achieve Goals: Good ADL Goals Pt Will Transfer to Toilet: with min guard assist;bedside  commode;ambulating(LRAD for amb) Pt Will Perform Toileting - Clothing Manipulation and hygiene: with min assist;sit to/from stand(using LRAD for amb and for toileting hygiene (bottom buddy, etc)) Additional ADL Goal #1: Pt will perform bed mobility with sup - CGA.  OT Frequency: Min 1X/week   Barriers to D/C:            Co-evaluation              AM-PAC PT "6 Clicks" Daily Activity     Outcome Measure Help from another person eating meals?: None Help from another person taking care of personal grooming?: None Help from another person toileting, which includes using toliet, bedpan, or urinal?: A Lot Help from another person bathing (including washing, rinsing, drying)?: A Lot Help from another person to put on and taking off regular upper body clothing?: A Little Help from another person to put on and taking off regular lower body clothing?: A Lot 6 Click Score: 17   End of Session Equipment Utilized During Treatment: Gait belt;Rolling walker  Activity Tolerance: Patient tolerated treatment well Patient left: in bed;with call bell/phone within reach;with bed alarm set;with family/visitor present  OT Visit Diagnosis: Other abnormalities of gait and mobility (R26.89);Repeated falls (R29.6);Muscle weakness (generalized) (M62.81);Other symptoms and signs involving cognitive function;Pain Pain - Right/Left: Right Pain - part of body: Hip                Time: 1341-1435 OT Time Calculation (min): 54 min Charges:  OT General Charges $OT Visit: 1 Visit OT Evaluation $OT Eval Moderate Complexity: 1 Mod OT Treatments $Self Care/Home Management : 23-37 mins  Jeni Salles, MPH, MS, OTR/L ascom [phone] 05/28/18, 3:04 PM

## 2018-05-28 NOTE — Plan of Care (Addendum)
Patient confuse to situation. Family was concern and Dr. Posey Pronto was notified. We are currently monitoring the patient for further changes yet the patient seems to be more alert after episode of being groggy. No falls. Voiding >30cc/hr. Had 3 BM's. Uses the bedside commode. Bed alarm on.  Problem: Education: Goal: Knowledge of General Education information will improve Description Including pain rating scale, medication(s)/side effects and non-pharmacologic comfort measures Outcome: Progressing   Problem: Health Behavior/Discharge Planning: Goal: Ability to manage health-related needs will improve Outcome: Progressing   Problem: Clinical Measurements: Goal: Ability to maintain clinical measurements within normal limits will improve Outcome: Progressing Goal: Will remain free from infection Outcome: Progressing Goal: Diagnostic test results will improve Outcome: Progressing Goal: Respiratory complications will improve Outcome: Progressing Goal: Cardiovascular complication will be avoided Outcome: Progressing   Problem: Activity: Goal: Risk for activity intolerance will decrease Outcome: Progressing   Problem: Nutrition: Goal: Adequate nutrition will be maintained Outcome: Progressing   Problem: Coping: Goal: Level of anxiety will decrease Outcome: Progressing   Problem: Elimination: Goal: Will not experience complications related to bowel motility Outcome: Progressing Goal: Will not experience complications related to urinary retention Outcome: Progressing   Problem: Pain Managment: Goal: General experience of comfort will improve Outcome: Progressing   Problem: Safety: Goal: Ability to remain free from injury will improve Outcome: Progressing   Problem: Skin Integrity: Goal: Risk for impaired skin integrity will decrease Outcome: Progressing

## 2018-05-29 LAB — H. PYLORI ANTIGEN, STOOL: H. Pylori Stool Ag, Eia: NEGATIVE

## 2018-05-29 LAB — GLUCOSE, CAPILLARY
GLUCOSE-CAPILLARY: 212 mg/dL — AB (ref 70–99)
Glucose-Capillary: 157 mg/dL — ABNORMAL HIGH (ref 70–99)
Glucose-Capillary: 193 mg/dL — ABNORMAL HIGH (ref 70–99)

## 2018-05-29 LAB — AMMONIA: Ammonia: 29 umol/L (ref 9–35)

## 2018-05-29 MED ORDER — LACTULOSE 10 GM/15ML PO SOLN
30.0000 g | Freq: Every day | ORAL | Status: DC
  Administered 2018-05-29 – 2018-06-01 (×4): 30 g via ORAL
  Filled 2018-05-29 (×4): qty 60

## 2018-05-29 MED ORDER — BACITRACIN-NEOMYCIN-POLYMYXIN 400-5-5000 EX OINT
TOPICAL_OINTMENT | Freq: Two times a day (BID) | CUTANEOUS | Status: DC
  Administered 2018-05-29 – 2018-06-01 (×6): 1 via TOPICAL
  Filled 2018-05-29 (×8): qty 1

## 2018-05-29 NOTE — Progress Notes (Signed)
[facility] Gastroenterology Inpatient Progress Note  Subjective: Patient seen for follow-up anemia, Hemoccult positive stool.  Hemoglobin stable at 10.  No overt bleeding noted.  Objective: Vital signs in last 24 hours: Temp:  [97.5 F (36.4 C)-97.8 F (36.6 C)] 97.5 F (36.4 C) (09/28 0521) Pulse Rate:  [61-69] 69 (09/28 0521) Resp:  [20] 20 (09/28 0521) BP: (119-145)/(39-52) 137/41 (09/28 0521) SpO2:  [97 %-99 %] 97 % (09/28 0521) Blood pressure (!) 137/41, pulse 69, temperature (!) 97.5 F (36.4 C), temperature source Oral, resp. rate 20, height 5\' 10"  (1.778 m), weight 113.4 kg, SpO2 97 %.    Intake/Output from previous day: 09/27 0701 - 09/28 0700 In: 368 [P.O.:368] Out: 900 [Urine:900]  Intake/Output this shift: Total I/O In: -  Out: 700 [Urine:700]   General appearance: Alert no acute distress Resp: Clear to auscultation Cardio: Regular rate no gallop GI: Soft benign no masses.  Bowel sounds positive. Extremities: Trace edema bilaterally.   Lab Results: Results for orders placed or performed during the hospital encounter of 05/26/18 (from the past 24 hour(s))  Ammonia     Status: Abnormal   Collection Time: 05/28/18 11:20 AM  Result Value Ref Range   Ammonia 38 (H) 9 - 35 umol/L  Glucose, capillary     Status: Abnormal   Collection Time: 05/28/18 11:48 AM  Result Value Ref Range   Glucose-Capillary 206 (H) 70 - 99 mg/dL  Ammonia     Status: None   Collection Time: 05/29/18  5:40 AM  Result Value Ref Range   Ammonia 29 9 - 35 umol/L     Recent Labs    05/27/18 0605 05/27/18 1106 05/28/18 0640  WBC  --   --  4.0  HGB 10.2* 10.0* 10.1*  HCT 30.9* 30.0* 30.0*  PLT  --   --  101*   BMET Recent Labs    05/27/18 0605 05/28/18 0640  NA 142 140  K 3.8 4.0  CL 108 107  CO2 26 24  GLUCOSE 140* 157*  BUN 11 13  CREATININE 0.85 0.74  CALCIUM 8.7* 8.6*   LFT No results for input(s): PROT, ALBUMIN, AST, ALT, ALKPHOS, BILITOT, BILIDIR, IBILI in  the last 72 hours. PT/INR No results for input(s): LABPROT, INR in the last 72 hours. Hepatitis Panel No results for input(s): HEPBSAG, HCVAB, HEPAIGM, HEPBIGM in the last 72 hours. C-Diff No results for input(s): CDIFFTOX in the last 72 hours. No results for input(s): CDIFFPCR in the last 72 hours.   Studies/Results: No results found.  Scheduled Inpatient Medications:   . diclofenac sodium  4 g Topical TID  . famotidine  20 mg Oral BID  . insulin aspart  0-15 Units Subcutaneous TID WC  . insulin aspart  0-5 Units Subcutaneous QHS  . lactulose  30 g Oral Daily  . lamoTRIgine  50 mg Oral BID  . lidocaine  1 patch Transdermal Q24H  . lurasidone  60 mg Oral Q breakfast  . meloxicam  15 mg Oral Daily  . nystatin cream   Topical BID  . QUEtiapine  25 mg Oral QHS  . tamsulosin  0.4 mg Oral Daily    Continuous Inpatient Infusions:    PRN Inpatient Medications:  acetaminophen **OR** acetaminophen, ALPRAZolam, liver oil-zinc oxide, ondansetron **OR** ondansetron (ZOFRAN) IV, senna-docusate  Miscellaneous:  Assessment:  1.  Anemia- hemoglobin stable.  Patient to end domestic partner have declined any procedures at this time.  Plan:  1.  GI will sign off.  I invited the patient to see the regular gastroenterologist, Dr. Gaylyn Cheers, in the outpatient setting after discharge for follow-up of anemia and consideration of possible capsule endoscopy of the small bowel depending on patient's risk assessment and wishes.  Teodoro K. Alice Reichert, M.D. 05/29/2018, 11:14 AM

## 2018-05-29 NOTE — Progress Notes (Signed)
PT Cancellation Note  Patient Details Name: [name] Day MRN: [id number] DOB: April 02, 1941   Cancelled Treatment:    Reason Eval/Treat Not Completed: Fatigue/lethargy limiting ability to participate   Pt up this am with nursing to commode.  Transferring well with walker and +2 assist.  Returned later this am for session.  Pt asleep in bed.  Initially resistant to session but agreed with encouragement.  However, he was unable to fully awaken and engage in session.  Talked with pt and companion for several minutes in attempts to awaken but he remained too lethargic to participate.  Will continue as appropriate.   Chesley Noon 05/29/2018, 11:22 AM

## 2018-05-29 NOTE — Progress Notes (Signed)
Pt refused cpap

## 2018-05-29 NOTE — Progress Notes (Signed)
[place] at [place] NAME: [name] Day    MR#:  [id number]  DATE OF BIRTH:  09-20-1940  SUBJECTIVE:  CHIEF COMPLAINT:   Chief Complaint  Patient presents with  . Weakness  Patient is very lethargic/sleepy today, patient with acute urinary retention today-refused Foley placement, was able to urinate some per nursing staff, did not tolerate hospital CPAP/refuses to wear hospital CPAP, his home CPAP device is dysfunctional  REVIEW OF SYSTEMS:  CONSTITUTIONAL: No fever, fatigue or weakness.  EYES: No blurred or double vision.  EARS, NOSE, AND THROAT: No tinnitus or ear pain.  RESPIRATORY: No cough, shortness of breath, wheezing or hemoptysis.  CARDIOVASCULAR: No chest pain, orthopnea, edema.  GASTROINTESTINAL: No nausea, vomiting, diarrhea or abdominal pain.  GENITOURINARY: No dysuria, hematuria.  ENDOCRINE: No polyuria, nocturia,  HEMATOLOGY: No anemia, easy bruising or bleeding SKIN: No rash or lesion. MUSCULOSKELETAL: No joint pain or arthritis.   NEUROLOGIC: No tingling, numbness, weakness.  PSYCHIATRY: No anxiety or depression.   ROS  DRUG ALLERGIES:   Allergies  Allergen Reactions  . Lisinopril Other (See Comments)    REACTION: Cough  . Penicillins Other (See Comments)    Reaction: unknown Has patient had a PCN reaction causing immediate rash, facial/tongue/throat swelling, SOB or lightheadedness with hypotension: Unknown Has patient had a PCN reaction causing severe rash involving mucus membranes or skin necrosis: Unknown Has patient had a PCN reaction that required hospitalization: Unknown Has patient had a PCN reaction occurring within the last 10 years: Unknown If all of the above answers are "NO", then may proceed with Cephalosporin use.   . Clindamycin/Lincomycin Rash and Other (See Comments)    "turns me purple" "turns me orange"    VITALS:  Blood pressure (!) 132/44, pulse (!) 59, temperature 98.3 F (36.8 C),  temperature source Oral, resp. rate 14, height 5\' 10"  (1.778 m), weight 113.4 kg, SpO2 96 %.  PHYSICAL EXAMINATION:  GENERAL:  77 y.o.-year-old patient lying in the bed with no acute distress.  EYES: Pupils equal, round, reactive to light and accommodation. No scleral icterus. Extraocular muscles intact.  HEENT: Head atraumatic, normocephalic. Oropharynx and nasopharynx clear.  NECK:  Supple, no jugular venous distention. No thyroid enlargement, no tenderness.  LUNGS: Normal breath sounds bilaterally, no wheezing, rales,rhonchi or crepitation. No use of accessory muscles of respiration.  CARDIOVASCULAR: S1, S2 normal. No murmurs, rubs, or gallops.  ABDOMEN: Soft, nontender, nondistended. Bowel sounds present. No organomegaly or mass.  EXTREMITIES: No pedal edema, cyanosis, or clubbing.  NEUROLOGIC: Cranial nerves II through XII are intact. Muscle strength 5/5 in all extremities. Sensation intact. Gait not checked.  PSYCHIATRIC: The patient is alert and oriented x 3.  SKIN: No obvious rash, lesion, or ulcer.   Physical Exam LABORATORY PANEL:   CBC Recent Labs  Lab 05/28/18 0640  WBC 4.0  HGB 10.1*  HCT 30.0*  PLT 101*   ------------------------------------------------------------------------------------------------------------------  Chemistries  Recent Labs  Lab 05/26/18 1111  05/28/18 0640  NA 137   < > 140  K 4.1   < > 4.0  CL 105   < > 107  CO2 26   < > 24  GLUCOSE 349*   < > 157*  BUN 14   < > 13  CREATININE 0.68   < > 0.74  CALCIUM 8.7*   < > 8.6*  AST 42*  --   --   ALT 20  --   --   ALKPHOS 80  --   --  BILITOT 2.1*  --   --    < > = values in this interval not displayed.   ------------------------------------------------------------------------------------------------------------------  Cardiac Enzymes Recent Labs  Lab 05/26/18 1111  TROPONINI <0.03    ------------------------------------------------------------------------------------------------------------------  RADIOLOGY:  No results found.  ASSESSMENT AND PLAN:  77 year old elderly male patient from [facility] assisted living facility with history of CVA, Bell's palsy, diabetes mellitus type 2,, carotid artery occlusion presented to the emergency room for weakness and fatigue  *Acute encephalopathy Most likely due to multifactorial process of obstructive sleep apnea, hyperammonemia, and medication side effect-refuses to wear hospital CPAP, home CPAP is dysfunctional Previous history of stroke unable to have MRI Neurology consult appreciated ABG shows no evidence of CO2 retention Ammonia level high treated with lactulose ammonia level now decrease Discontinue Seroquel and Ativan, neurochecks per routine, aspiration/fall precautions while in house  *Anemia with guaiac positive stool Hemoglobin is 10/stable Gastroenterology input appreciated-no intervention recommended  *History of CVA and Bell's palsy Stable Repeat CT head no evidence of stroke 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 Management plans discussed with the patient, family and they are in agreement.  CODE STATUS: dnr  TOTAL TIME TAKING CARE OF THIS PATIENT: 40 minutes.     POSSIBLE D/C IN 2 DAYS, DEPENDING ON CLINICAL CONDITION.   [name] M.D on 05/29/2018    Between 7am to 6pm - Pager - [phone]  After 6pm go to [email or web address] - password [email or web address] [facility]  Office  [phone]  CC: Primary care physician; Idelle Crouch, MD  Note: This dictation was prepared with Dragon dictation along with smaller phrase technology. Any transcriptional errors that result from this process are unintentional.

## 2018-05-29 NOTE — Progress Notes (Signed)
Daughter in law, [name], came by to visit patient and get an update. States that patient called herself and her boyfriend over in the night very confused and agitated. Left her number for night shift. If patient becomes restless and agitated, she wants to be called and she can come sit with him. [phone].

## 2018-05-29 NOTE — Progress Notes (Signed)
Pt would not tolerate hospital cpap, home cpap is in the room, however the exahalation port was malfunctioning and pt stated he did not feel adequate air flow. Hospital  cpap removed after 30 mins by pt.

## 2018-05-30 LAB — BLOOD GAS, ARTERIAL
Acid-Base Excess: 3.6 mmol/L — ABNORMAL HIGH (ref 0.0–2.0)
BICARBONATE: 27.7 mmol/L (ref 20.0–28.0)
FIO2: 0.21
O2 Saturation: 95.8 %
PCO2 ART: 39 mmHg (ref 32.0–48.0)
PH ART: 7.46 — AB (ref 7.350–7.450)
PO2 ART: 76 mmHg — AB (ref 83.0–108.0)
Patient temperature: 37

## 2018-05-30 LAB — GLUCOSE, CAPILLARY
GLUCOSE-CAPILLARY: 198 mg/dL — AB (ref 70–99)
Glucose-Capillary: 122 mg/dL — ABNORMAL HIGH (ref 70–99)
Glucose-Capillary: 177 mg/dL — ABNORMAL HIGH (ref 70–99)
Glucose-Capillary: 258 mg/dL — ABNORMAL HIGH (ref 70–99)

## 2018-05-30 NOTE — Progress Notes (Signed)
[place] at [place] NAME: [name] Day    MR#:  [id number]  DATE OF BIRTH:  [birth year]  SUBJECTIVE:  CHIEF COMPLAINT:   Chief Complaint  Patient presents with  . Weakness  Patient is much more awake/alert, patient continues to refuse CPAP device, patient also refuses ABG to be done, son is at the bedside, plans for discharge to skilled nursing facility on tomorrow barring any complications-all questions answered   REVIEW OF SYSTEMS:  CONSTITUTIONAL: No fever, fatigue or weakness.  EYES: No blurred or double vision.  EARS, NOSE, AND THROAT: No tinnitus or ear pain.  RESPIRATORY: No cough, shortness of breath, wheezing or hemoptysis.  CARDIOVASCULAR: No chest pain, orthopnea, edema.  GASTROINTESTINAL: No nausea, vomiting, diarrhea or abdominal pain.  GENITOURINARY: No dysuria, hematuria.  ENDOCRINE: No polyuria, nocturia,  HEMATOLOGY: No anemia, easy bruising or bleeding SKIN: No rash or lesion. MUSCULOSKELETAL: No joint pain or arthritis.   NEUROLOGIC: No tingling, numbness, weakness.  PSYCHIATRY: No anxiety or depression.   ROS  DRUG ALLERGIES:   Allergies  Allergen Reactions  . Lisinopril Other (See Comments)    REACTION: Cough  . Penicillins Other (See Comments)    Reaction: unknown Has patient had a PCN reaction causing immediate rash, facial/tongue/throat swelling, SOB or lightheadedness with hypotension: Unknown Has patient had a PCN reaction causing severe rash involving mucus membranes or skin necrosis: Unknown Has patient had a PCN reaction that required hospitalization: Unknown Has patient had a PCN reaction occurring within the last 10 years: Unknown If all of the above answers are "NO", then may proceed with Cephalosporin use.   . Clindamycin/Lincomycin Rash and Other (See Comments)    "turns me purple" "turns me orange"    VITALS:  Blood pressure 138/63, pulse 61, temperature 98.9 F (37.2 C), temperature  source Axillary, resp. rate 16, height 5\' 10"  (1.778 m), weight 113.4 kg, SpO2 97 %.  PHYSICAL EXAMINATION:  GENERAL:  77 y.o.-year-old patient lying in the bed with no acute distress.  EYES: Pupils equal, round, reactive to light and accommodation. No scleral icterus. Extraocular muscles intact.  HEENT: Head atraumatic, normocephalic. Oropharynx and nasopharynx clear.  NECK:  Supple, no jugular venous distention. No thyroid enlargement, no tenderness.  LUNGS: Normal breath sounds bilaterally, no wheezing, rales,rhonchi or crepitation. No use of accessory muscles of respiration.  CARDIOVASCULAR: S1, S2 normal. No murmurs, rubs, or gallops.  ABDOMEN: Soft, nontender, nondistended. Bowel sounds present. No organomegaly or mass.  EXTREMITIES: No pedal edema, cyanosis, or clubbing.  NEUROLOGIC: Cranial nerves II through XII are intact. Muscle strength 5/5 in all extremities. Sensation intact. Gait not checked.  PSYCHIATRIC: The patient is alert and oriented x 3.  SKIN: No obvious rash, lesion, or ulcer.   Physical Exam LABORATORY PANEL:   CBC Recent Labs  Lab 05/28/18 0640  WBC 4.0  HGB 10.1*  HCT 30.0*  PLT 101*   ------------------------------------------------------------------------------------------------------------------  Chemistries  Recent Labs  Lab 05/26/18 1111  05/28/18 0640  NA 137   < > 140  K 4.1   < > 4.0  CL 105   < > 107  CO2 26   < > 24  GLUCOSE 349*   < > 157*  BUN 14   < > 13  CREATININE 0.68   < > 0.74  CALCIUM 8.7*   < > 8.6*  AST 42*  --   --   ALT 20  --   --   ALKPHOS 80  --   --  BILITOT 2.1*  --   --    < > = values in this interval not displayed.   ------------------------------------------------------------------------------------------------------------------  Cardiac Enzymes Recent Labs  Lab 05/26/18 1111  TROPONINI <0.03    ------------------------------------------------------------------------------------------------------------------  RADIOLOGY:  No results found.  ASSESSMENT AND PLAN:  77 year old elderly male patient from [facility] assisted living facility with history of CVA, Bell's palsy, diabetes mellitus type 2,, carotid artery occlusion presented to the emergency room for weakness and fatigue  *Acute encephalopathy Resolved Most likely due to multifactorial process of obstructive sleep apnea, hyperammonemia, and medication side effect-refuses to wear hospital CPAP, home CPAP is dysfunctional Previous history of stroke unable to have MRI Neurology  input appreciated  ABG shows no evidence of CO2 retention Elevated ammonia level resolved with lactulose, patient continues to refuse CPAP-son is very much aware as this is a chronic issue for the patient for the last 5 years per son, aspiration/fall precautions while in house  *Anemia with guaiac positive stool Hemoglobin is 10/stable Gastroenterology input appreciated-no intervention recommended  *History of CVA and Bell's palsy Stable Repeat CT head no evidence of stroke 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 and MRI of the brain cannot be done Statin discontinued due to elevated ammonia as per recommendation per neurology  *Chronic diabetes mellitus type 2  Stable Sliding scale insulin with Accu-Cheks per routine   *Obstructive sleep apnea  Patient refusing hospital CPAP, home CPAP device is dysfunctional  We will encourage patient to use hospital CPAP   *Acute on chronic noncompliance with medical management The importance of compliance was again reiterated to the patient as well as the patient's son  Disposition to skilled nursing facility tentatively planned for on tomorrow/Monday   All the records are reviewed and case discussed with Care Management/Social Workerr. Management plans discussed with  the patient, family and they are in agreement.  CODE STATUS: dnr  TOTAL TIME TAKING CARE OF THIS PATIENT: 40 minutes.     POSSIBLE D/C IN 2 DAYS, DEPENDING ON CLINICAL CONDITION.   Avel Peace Salary M.D on 05/30/2018   Between 7am to 6pm - Pager - [phone]  After 6pm go to [email or web address] - password [email or web address] [facility]  Office  [phone]  CC: Primary care physician; Idelle Crouch, MD  Note: This dictation was prepared with Dragon dictation along with smaller phrase technology. Any transcriptional errors that result from this process are unintentional.

## 2018-05-31 LAB — GLUCOSE, CAPILLARY
GLUCOSE-CAPILLARY: 203 mg/dL — AB (ref 70–99)
GLUCOSE-CAPILLARY: 253 mg/dL — AB (ref 70–99)
GLUCOSE-CAPILLARY: 144 mg/dL — AB (ref 70–99)
Glucose-Capillary: 228 mg/dL — ABNORMAL HIGH (ref 70–99)
Glucose-Capillary: 138 mg/dL — ABNORMAL HIGH (ref 70–99)
Glucose-Capillary: 205 mg/dL — ABNORMAL HIGH (ref 70–99)
Glucose-Capillary: 210 mg/dL — ABNORMAL HIGH (ref 70–99)
Glucose-Capillary: 220 mg/dL — ABNORMAL HIGH (ref 70–99)

## 2018-05-31 NOTE — Progress Notes (Signed)
Day shift RT was made aware that pt. needs to have BIPAP applied at QS, to please re-enforce the importance of the device and to notify primary RN if pt. refused that way primary RN can talk to patient. Please document specific times the hours of usage.

## 2018-05-31 NOTE — Progress Notes (Signed)
[place] at [place] NAME: [name] Day    MR#:  [id number]  DATE OF BIRTH:  [birth year]  SUBJECTIVE:  CHIEF COMPLAINT:   Chief Complaint  Patient presents with  . Weakness  Patient is without complaint, son at the bedside, awaiting placement REVIEW OF SYSTEMS:  CONSTITUTIONAL: No fever, fatigue or weakness.  EYES: No blurred or double vision.  EARS, NOSE, AND THROAT: No tinnitus or ear pain.  RESPIRATORY: No cough, shortness of breath, wheezing or hemoptysis.  CARDIOVASCULAR: No chest pain, orthopnea, edema.  GASTROINTESTINAL: No nausea, vomiting, diarrhea or abdominal pain.  GENITOURINARY: No dysuria, hematuria.  ENDOCRINE: No polyuria, nocturia,  HEMATOLOGY: No anemia, easy bruising or bleeding SKIN: No rash or lesion. MUSCULOSKELETAL: No joint pain or arthritis.   NEUROLOGIC: No tingling, numbness, weakness.  PSYCHIATRY: No anxiety or depression.   ROS  DRUG ALLERGIES:   Allergies  Allergen Reactions  . Lisinopril Other (See Comments)    REACTION: Cough  . Penicillins Other (See Comments)    Reaction: unknown Has patient had a PCN reaction causing immediate rash, facial/tongue/throat swelling, SOB or lightheadedness with hypotension: Unknown Has patient had a PCN reaction causing severe rash involving mucus membranes or skin necrosis: Unknown Has patient had a PCN reaction that required hospitalization: Unknown Has patient had a PCN reaction occurring within the last 10 years: Unknown If all of the above answers are "NO", then may proceed with Cephalosporin use.   . Clindamycin/Lincomycin Rash and Other (See Comments)    "turns me purple" "turns me orange"    VITALS:  Blood pressure (!) 146/52, pulse (!) 59, temperature 98.3 F (36.8 C), temperature source Oral, resp. rate 18, height 5\' 10"  (1.778 m), weight 113.4 kg, SpO2 96 %.  PHYSICAL EXAMINATION:  GENERAL:  77 y.o.-year-old patient lying in the bed with no  acute distress.  EYES: Pupils equal, round, reactive to light and accommodation. No scleral icterus. Extraocular muscles intact.  HEENT: Head atraumatic, normocephalic. Oropharynx and nasopharynx clear.  NECK:  Supple, no jugular venous distention. No thyroid enlargement, no tenderness.  LUNGS: Normal breath sounds bilaterally, no wheezing, rales,rhonchi or crepitation. No use of accessory muscles of respiration.  CARDIOVASCULAR: S1, S2 normal. No murmurs, rubs, or gallops.  ABDOMEN: Soft, nontender, nondistended. Bowel sounds present. No organomegaly or mass.  EXTREMITIES: No pedal edema, cyanosis, or clubbing.  NEUROLOGIC: Cranial nerves II through XII are intact. Muscle strength 5/5 in all extremities. Sensation intact. Gait not checked.  PSYCHIATRIC: The patient is alert and oriented x 3.  SKIN: No obvious rash, lesion, or ulcer.   Physical Exam LABORATORY PANEL:   CBC Recent Labs  Lab 05/28/18 0640  WBC 4.0  HGB 10.1*  HCT 30.0*  PLT 101*   ------------------------------------------------------------------------------------------------------------------  Chemistries  Recent Labs  Lab 05/26/18 1111  05/28/18 0640  NA 137   < > 140  K 4.1   < > 4.0  CL 105   < > 107  CO2 26   < > 24  GLUCOSE 349*   < > 157*  BUN 14   < > 13  CREATININE 0.68   < > 0.74  CALCIUM 8.7*   < > 8.6*  AST 42*  --   --   ALT 20  --   --   ALKPHOS 80  --   --   BILITOT 2.1*  --   --    < > = values in this interval not displayed.   ------------------------------------------------------------------------------------------------------------------  Cardiac Enzymes Recent Labs  Lab 05/26/18 1111  TROPONINI <0.03   ------------------------------------------------------------------------------------------------------------------  RADIOLOGY:  No results found.  ASSESSMENT AND PLAN:  77 year old elderly male patient from [facility] assisted living facility with history of CVA, Bell's  palsy, diabetes mellitus type 2,, carotid artery occlusion presented to the emergency room for weakness and fatigue  *Acute encephalopathy Resolved Most likely due to multifactorial process of obstructive sleep apnea, hyperammonemia, and medication side effect-refuses to wear hospital CPAP, home CPAP is dysfunctional Previous history of stroke unable to have MRI Neurology did see patient while in house  ABG shows no evidence of CO2 retention Elevated ammonia level resolved with lactulose, patient continues to refuse CPAP-son is very much aware as this is a chronic issue for the patient for the last 5 years per son, aspiration/fall precautions while in house  *Anemia with guaiac positive stool Hemoglobin is 10/stable Gastroenterology input appreciated-no intervention recommended  *History of CVA and Bell's palsy Stable Repeat CT head no evidence of stroke 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 and MRI of the brain cannot be done Statin discontinued due to elevated ammonia as per recommendation per neurology  *Chronic diabetes mellitus type 2  Stable Sliding scale insulin with Accu-Cheks per routine   *Obstructive sleep apnea  Patient refusing hospital CPAP, home CPAP device is dysfunctional -son to try to fix later today We will encourage patient to use hospital CPAP   *Acute on chronic noncompliance with medical management The importance of compliance was again reiterated to the patient as well as the patient's son  Disposition to skilled nursing facility once cleared by insurance   All the records are reviewed and case discussed with Care Management/Social Workerr. Management plans discussed with the patient, family and they are in agreement.  CODE STATUS: dnr  TOTAL TIME TAKING CARE OF THIS PATIENT: 40 minutes.   POSSIBLE D/C IN 2 DAYS, DEPENDING ON CLINICAL CONDITION.   Avel Peace Salary M.D on 05/31/2018   Between 7am to 6pm - Pager -  [phone]  After 6pm go to [email or web address] - password [email or web address] [facility]  Office  [phone]  CC: Primary care physician; Idelle Crouch, MD  Note: This dictation was prepared with Dragon dictation along with smaller phrase technology. Any transcriptional errors that result from this process are unintentional.

## 2018-05-31 NOTE — Progress Notes (Signed)
Pt. Does not need  Bladder scan. Pt has been voiding.

## 2018-05-31 NOTE — Clinical Social Work Note (Signed)
Patient's pasrr was sent for further evaluation and thus pasrr will not be ready today. [place] and patient's son have been updated.  Shela Leff MSW,LCSW [phone]

## 2018-05-31 NOTE — Progress Notes (Signed)
PT Cancellation Note  Patient Details Name: VINCENT STREATER MRN: [id number] DOB: 04-Apr-1941   Cancelled Treatment:    Reason Eval/Treat Not Completed: Other (comment). Treatment attempted twice. Pt has been occupied in the bathroom and unable to participate on attempts. Re attempt at a later date, as pt is available.    Larae Grooms, PTA 05/31/2018, 5:13 PM

## 2018-05-31 NOTE — Care Management Important Message (Signed)
Copy of signed IM left with patient in room.  

## 2018-06-01 ENCOUNTER — Encounter
Admission: RE | Admit: 2018-06-01 | Discharge: 2018-06-01 | Disposition: A | Discharge status: Home / Self Care | Payer: Medicare Other | Source: Ambulatory Visit | Attending: Internal Medicine | Admitting: Internal Medicine

## 2018-06-01 LAB — GLUCOSE, CAPILLARY
GLUCOSE-CAPILLARY: 118 mg/dL — AB (ref 70–99)
GLUCOSE-CAPILLARY: 190 mg/dL — AB (ref 70–99)

## 2018-06-01 MED ORDER — ALPRAZOLAM 0.5 MG PO TABS: 0.5000 mg | ORAL_TABLET | Freq: Every evening | ORAL | 0 refills | Status: DC | PRN

## 2018-06-01 MED ORDER — ZINC OXIDE 40 % EX OINT: TOPICAL_OINTMENT | Freq: Three times a day (TID) | CUTANEOUS | 0 refills | Status: AC | PRN

## 2018-06-01 MED ORDER — AMPHETAMINE-DEXTROAMPHETAMINE 20 MG PO TABS: 20.0000 mg | ORAL_TABLET | Freq: Every day | ORAL | 0 refills | Status: DC

## 2018-06-01 MED ORDER — BACITRACIN-NEOMYCIN-POLYMYXIN 400-5-5000 EX OINT: TOPICAL_OINTMENT | Freq: Two times a day (BID) | CUTANEOUS | 0 refills | Status: DC

## 2018-06-01 MED ORDER — LACTULOSE 10 GM/15ML PO SOLN: 10.0000 g | Freq: Every day | ORAL | 0 refills | Status: AC

## 2018-06-01 MED ORDER — AMBULATORY NON FORMULARY MEDICATION: 0 refills | Status: DC

## 2018-06-01 MED ORDER — LIDOCAINE 5 % EX PTCH: 1.0000 | MEDICATED_PATCH | CUTANEOUS | 0 refills | Status: AC

## 2018-06-01 MED ORDER — MELOXICAM 15 MG PO TABS: 15.0000 mg | ORAL_TABLET | Freq: Every day | ORAL | 0 refills | Status: DC

## 2018-06-01 MED ORDER — NYSTATIN 100000 UNIT/GM EX CREA: TOPICAL_CREAM | Freq: Two times a day (BID) | CUTANEOUS | 0 refills | Status: DC

## 2018-06-01 NOTE — Progress Notes (Signed)
Nsg Discharge Note  Admit Date:  05/26/2018 Discharge date: 06/01/2018   [name] to be D/C'd Skilled nursing facility per MD order.  AVS completed.  EMS provided copy of d/c summary. Facility has copy previously faxed  Discharge Medication: Allergies as of 06/01/2018      Reactions   Lisinopril Other (See Comments)   REACTION: Cough   Penicillins Other (See Comments)   Reaction: unknown Has patient had a PCN reaction causing immediate rash, facial/tongue/throat swelling, SOB or lightheadedness with hypotension: Unknown Has patient had a PCN reaction causing severe rash involving mucus membranes or skin necrosis: Unknown Has patient had a PCN reaction that required hospitalization: Unknown Has patient had a PCN reaction occurring within the last 10 years: Unknown If all of the above answers are "NO", then may proceed with Cephalosporin use.   Clindamycin/lincomycin Rash, Other (See Comments)   "turns me purple" "turns me orange"      Medication List    STOP taking these medications   ibuprofen 400 MG tablet Commonly known as:  ADVIL,MOTRIN   pravastatin 40 MG tablet Commonly known as:  PRAVACHOL     TAKE these medications   allopurinol 300 MG tablet Commonly known as:  ZYLOPRIM Take 1 tablet (300 mg total) by mouth daily as needed (gout).   ALPRAZolam 0.5 MG tablet Commonly known as:  XANAX Take 1 tablet (0.5 mg total) by mouth at bedtime as needed for anxiety.   AMBULATORY NON FORMULARY MEDICATION BiPAP machine @ 15/12 DX: OSA DX Code:   amphetamine-dextroamphetamine 20 MG tablet Commonly known as:  ADDERALL Take 1 tablet (20 mg total) by mouth daily.   aspirin 325 MG tablet Take 1 tablet (325 mg total) by mouth daily.   clotrimazole-betamethasone cream Commonly known as:  LOTRISONE Apply topically 2 (two) times daily. Apply to effected area   desonide 0.05 % cream Commonly known as:  DESOWEN Apply 1 application topically 2 (two) times daily.    diclofenac sodium 1 % Gel Commonly known as:  VOLTAREN Apply 4 g topically 4 (four) times daily. to the right lateral hip   FREESTYLE LITE test strip Generic drug:  glucose blood 1 each by Other route as directed.   gabapentin 100 MG capsule Commonly known as:  NEURONTIN Take 2 capsules (200 mg total) by mouth 3 (three) times daily.   insulin glargine 100 UNIT/ML injection Commonly known as:  LANTUS Inject 10 Units into the skin at bedtime.   insulin regular human CONCENTRATED 500 UNIT/ML injection Commonly known as:  HUMULIN R Inject 0.1 mLs (50 Units total) into the skin 2 (two) times daily with a meal.   lactulose 10 GM/15ML solution Commonly known as:  CHRONULAC Take 15 mLs (10 g total) by mouth daily. Start taking on:  06/02/2018   lamoTRIgine 25 MG tablet Commonly known as:  LAMICTAL Take 2 tablets (50 mg total) by mouth 2 (two) times daily.   lidocaine 5 % Commonly known as:  LIDODERM Place 1 patch onto the skin daily. Remove & Discard patch within 12 hours or as directed by MD   liver oil-zinc oxide 40 % ointment Commonly known as:  DESITIN Apply topically 3 (three) times daily as needed for irritation.   Lurasidone HCl 60 MG Tabs Take 1 tablet (60 mg total) by mouth daily.   meloxicam 15 MG tablet Commonly known as:  MOBIC Take 1 tablet (15 mg total) by mouth daily. Start taking on:  06/02/2018   neomycin-bacitracin-polymyxin ointment Commonly  known as:  NEOSPORIN Apply topically 2 (two) times daily.   nystatin cream Commonly known as:  MYCOSTATIN Apply topically 2 (two) times daily.   omeprazole 40 MG capsule Commonly known as:  PRILOSEC Take 1 capsule (40 mg total) by mouth daily.   tamsulosin 0.4 MG Caps capsule Commonly known as:  FLOMAX Take 1 capsule (0.4 mg total) by mouth daily.       Discharge Assessment: Vitals:   06/01/18 0447 06/01/18 1309  BP: (!) 143/56 (!) 143/48  Pulse: 61 (!) 59  Resp: 20 16  Temp: 98 F (36.7 C) 98.7 F  (37.1 C)  SpO2: 99% 99%   Skin clean, dry and intact without evidence of skin break down, no evidence of skin tears noted. IV catheter discontinued intact. Site without signs and symptoms of complications - no redness or edema noted at insertion site, patient denies c/o pain - only slight tenderness at site.  Dressing with slight pressure applied.  D/c Instructions-Education: Discharge instructions given to patient/family with verbalized understanding. D/c education completed with patient/family including follow up instructions, medication list, d/c activities limitations if indicated, with other d/c instructions as indicated by MD Provided to EMS.  Patient instructed to return to ED, call 911, or call MD for any changes in condition.    Eda Keys, RN 06/01/2018 4:11 PM

## 2018-06-01 NOTE — Progress Notes (Addendum)
Pt continues to have swelling around his penis/glans. Pt's son would like a urologist to see pt. Paged Dr Jerelyn Charles with same.   MD placed orders. Paged MD again with additional request for son to speak with MD.

## 2018-06-01 NOTE — Clinical Social Work Placement (Signed)
   CLINICAL SOCIAL WORK PLACEMENT  NOTE  Date:  06/01/2018  Patient Details  Name: [name] Day MRN: [id number] Date of Birth: 12/24/1940  Clinical Social Work is seeking post-discharge placement for this patient at the [place] level of care (*CSW will initial, date and re-position this form in  chart as items are completed):  Yes   Patient/family provided with [facility] Work Department's list of facilities offering this level of care within the geographic area requested by the patient (or if unable, by the patient's family).  Yes   Patient/family informed of their freedom to choose among providers that offer the needed level of care, that participate in Medicare, Medicaid or managed care program needed by the patient, have an available bed and are willing to accept the patient.  Yes   Patient/family informed of [facility]'s ownership interest in [facility] and [facility], as well as of the fact that they are under no obligation to receive care at these facilities.  PASRR submitted to EDS on 05/28/18     PASRR number received on 06/01/18     Existing PASRR number confirmed on       FL2 transmitted to all facilities in geographic area requested by pt/family on 05/28/18     FL2 transmitted to all facilities within larger geographic area on 06/01/18     Patient informed that his/her managed care company has contracts with or will negotiate with certain facilities, including the following:        Yes   Patient/family informed of bed offers received.  Patient chooses bed at [facility])     Physician recommends and patient chooses bed at [facility])    Patient to be transferred to [facility]) on 06/01/18.  Patient to be transferred to facility by (EMS)     Patient family notified on 06/01/18 of transfer.  Name of family member notified:  Shela Leff MSW,LcSW DP)     PHYSICIAN       Additional Comment:     _______________________________________________ Shela Leff, LCSW 06/01/2018, 2:14 PM

## 2018-06-01 NOTE — Progress Notes (Signed)
Inpatient Diabetes Program Recommendations  AACE/ADA: New Consensus Statement on Inpatient Glycemic Control (2015)  Target Ranges:  Prepandial:   less than 140 mg/dL      Peak postprandial:   less than 180 mg/dL (1-2 hours)      Critically ill patients:  140 - 180 mg/dL   Results for ASWAD, WANDREY (MRN [id number]) as of 06/01/2018 10:35  Ref. Range 05/31/2018 08:01 05/31/2018 12:34 05/31/2018 16:35 05/31/2018 22:45  Glucose-Capillary Latest Ref Range: 70 - 99 mg/dL 138 (H)  2 units NOVOLOG  205 (H)  5 units NOVOLOG  210 (H)  5 units NOVOLOG  220 (H)       Home DM Meds: Lantus 10 units QHS       U-500 Concentrated Insulin 50 units BID (NOT taking)  Current Orders: Novolog Moderate Correction Scale/ SSI (0-15 units) TID AC + HS     MD- Please consider starting Novolog Meal Coverage for this patient:  Novolog 3 units TID with meals  (Please add the following Hold Parameters: Hold if pt eats <50% of meal, Hold if pt NPO)     --Will follow patient during hospitalization--  Wyn Quaker RN, MSN, CDE Diabetes Coordinator Inpatient Glycemic Control Team Team Pager: [phone] (8a-5p)

## 2018-06-01 NOTE — Progress Notes (Signed)
Physical Therapy Treatment Patient Details Name: [name] Day MRN: [id number] DOB: [birth year] Today's Date: 06/01/2018    History of Present Illness  [name] Day  is a 77 y.o. 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  Patient was found to be more weak according to the family.  He usually uses a walker to ambulate at the facility.  Patient has generalized fatigue.  He was evaluated in the emergency room hemoglobin is 9.5.  Usually baseline hemoglobin is around 11.5-12.  Patient is on aspirin as well as Motrin at the facility.  Patient states he had a colonoscopy done in February which was a normal study.  Stool guaiac was positive in the emergency room.  Initially was also worked up for stroke in the emergency room with a CT head which showed no acute abnormality except for old strokes.     PT Comments    Pt awake and ready for session this am.  To edge of bed with HOB raised.  Pt with increased time and use of rail but able to get to edge with supervision.  Stood with verbal cues for hand placements and multiple attempts.  Stood with min a x 2.  He was able to increase his ambulation distance to 100' x 2 with walker and min a x 2 with wheelchair follow from son.  While gait has overall improved, he presents with significant lateral sway, occasional post/ant sway and general unsteadiness.  A rest break was needed for fatigue.  He remains unsafe with ambulation and should have +2 assist at all times with gait for pt and staff safety.  He does continue to have variable mobility levels - son stated he was helping him to commode last night and he buckled but was able to safely get him to the bed.  SNF remains appropriate upon discharge.   Follow Up Recommendations  SNF     Equipment Recommendations  None recommended by PT;Other (comment)    Recommendations for Other Services       Precautions / Restrictions Precautions Precautions: Fall Restrictions Weight Bearing Restrictions: No    Mobility  Bed Mobility Overal bed mobility: Needs Assistance Bed Mobility: Supine to Sit     Supine to sit: Min assist;HOB elevated        Transfers   Equipment used: Rolling walker (2 wheeled) Transfers: Sit to/from Stand Sit to Stand: Min assist;+2 safety/equipment         General transfer comment: cues for hand placements  Ambulation/Gait Ambulation/Gait assistance: Min assist;+2 physical assistance Gait Distance (Feet): 100 Feet Assistive device: Rolling walker (2 wheeled) Gait Pattern/deviations: Step-through pattern;Drifts right/left;Decreased step length - right;Decreased step length - left Gait velocity: decreased   General Gait Details: significant lateral and occasionally post/ant sway   Stairs             Wheelchair Mobility    Modified Rankin (Stroke Patients Only)       Balance Overall balance assessment: Needs assistance Sitting-balance support: No upper extremity supported Sitting balance-Leahy Scale: Good     Standing balance support: Bilateral upper extremity supported Standing balance-Leahy Scale: Poor Standing balance comment: generally unsteady  Cognition Arousal/Alertness: Awake/alert Behavior During Therapy: WFL for tasks assessed/performed Overall Cognitive Status: Within Functional Limits for tasks assessed                                        Exercises      General Comments        Pertinent Vitals/Pain Pain Assessment: No/denies pain    Home Living                      Prior Function            PT Goals (current goals can now be found in the care plan section) Progress towards PT goals: Progressing toward goals     Frequency    Min 2X/week      PT Plan Current plan remains appropriate    Co-evaluation              AM-PAC PT "6 Clicks" Daily Activity  Outcome Measure  Difficulty turning over in bed (including adjusting bedclothes, sheets and blankets)?: A Little Difficulty moving from lying on back to sitting on the side of the bed? : A Little Difficulty sitting down on and standing up from a chair with arms (e.g., wheelchair, bedside commode, etc,.)?: Unable Help needed moving to and from a bed to chair (including a wheelchair)?: A Lot Help needed walking in hospital room?: A Lot Help needed climbing 3-5 steps with a railing? : Total 6 Click Score: 12    End of Session Equipment Utilized During Treatment: Gait belt Activity Tolerance: Patient tolerated treatment well Patient left: in chair;with chair alarm set;with call bell/phone within reach;with family/visitor present Nurse Communication: Mobility status       Time: 0950-1006 PT Time Calculation (min) (ACUTE ONLY): 16 min  Charges:  $Gait Training: 8-22 mins                     [name] Day, PTA 06/01/18, 10:20 AM

## 2018-06-01 NOTE — Clinical Social Work Note (Signed)
Patient's pasrr has been received. Patient to transfer to [facility] today. Discharge information sent. Patient's son, [name], is aware. Patient will transport via EMS. Shela Leff MSW,LCSW [phone]

## 2018-06-01 NOTE — Progress Notes (Signed)
Report called to [place] at [place]. Questions asked and answered. EMS called. D/C summary provided to EMS.

## 2018-06-01 NOTE — Discharge Summary (Signed)
[place] [facility] [place] NAME: [name] Day    MR#:  [id number]  DATE OF BIRTH:  1940-10-15  DATE OF ADMISSION:  05/26/2018 ADMITTING PHYSICIAN: Saundra Shelling, MD  DATE OF DISCHARGE: No discharge date for patient encounter.  PRIMARY CARE PHYSICIAN: Idelle Crouch, MD    ADMISSION DIAGNOSIS:  Weakness [R53.1] Gastrointestinal hemorrhage, unspecified gastrointestinal hemorrhage type [K92.2] Anemia, unspecified type [D64.9]  DISCHARGE DIAGNOSIS:  Active Problems:   Anemia   SECONDARY DIAGNOSIS:   Past Medical History:  Diagnosis Date  . Anemia   . Bipolar 1 disorder ([name])   . Carotid artery occlusion   . Dementia   . Diabetes mellitus without complication ([name])   . Diabetic peripheral neuropathy ([name]) 03/03/2018  . Gait abnormality 02/12/2017  . Left-sided Bell's palsy 03/03/2018  . Memory disorder 02/12/2017  . TIA (transient ischemic attack)     HOSPITAL COURSE:   77 year old elderly male patient from [facility] assisted living facility with history of CVA, Bell's palsy, diabetes mellitus type 2,, carotid artery occlusion presented to the emergency room for weakness and fatigue  *Acute encephalopathy Resolved Most likely due to multifactorial process of obstructive sleep apnea, hyperammonemia, and medication side effect-refuses to wear hospital CPAP, home CPAP is dysfunctional Previous history of stroke unable to have MRI Neurologydid see patient while in house  ABG shows no evidence of CO2 retention Elevated ammonia level resolved with lactulose, patient continues to refuse CPAP-son is very much aware as this is a chronic issue for the patient for the last 5 years per son, aspiration/fall precautions while in house  *Anemia with guaiac positive stool Hemoglobin is 10/stable Gastroenterology input appreciated-no intervention recommended  *History of CVA and Bell's palsy Stable Repeat CT head no evidence of  stroke 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 and MRI of the brain cannot be done Statin discontinued due to elevated ammonia as per recommendation per neurology  *Chronic diabetes mellitus type 2  Stable Sliding scale insulin with Accu-Cheks per routine   *Obstructive sleep apnea  Patient refusing hospital CPAP, home CPAP device is dysfunctional -son to try to fix Continue Rx HS and prn  *Acute on chronic noncompliance with medical management The importance of compliance was again reiterated to the patient as well as the patient's son   DISCHARGE CONDITIONS:   stable  CONSULTS OBTAINED:  Treatment Team:  Alexis Goodell, MD Abbie Sons, MD  DRUG ALLERGIES:   Allergies  Allergen Reactions  . Lisinopril Other (See Comments)    REACTION: Cough  . Penicillins Other (See Comments)    Reaction: unknown Has patient had a PCN reaction causing immediate rash, facial/tongue/throat swelling, SOB or lightheadedness with hypotension: Unknown Has patient had a PCN reaction causing severe rash involving mucus membranes or skin necrosis: Unknown Has patient had a PCN reaction that required hospitalization: Unknown Has patient had a PCN reaction occurring within the last 10 years: Unknown If all of the above answers are "NO", then may proceed with Cephalosporin use.   . Clindamycin/Lincomycin Rash and Other (See Comments)    "turns me purple" "turns me orange"    DISCHARGE MEDICATIONS:   Allergies as of 06/01/2018      Reactions   Lisinopril Other (See Comments)   REACTION: Cough   Penicillins Other (See Comments)   Reaction: unknown Has patient had a PCN reaction causing immediate rash, facial/tongue/throat swelling, SOB or lightheadedness with hypotension: Unknown Has patient  had a PCN reaction causing severe rash involving mucus membranes or skin necrosis: Unknown Has patient had a PCN reaction that required hospitalization: Unknown Has  patient had a PCN reaction occurring within the last 10 years: Unknown If all of the above answers are "NO", then may proceed with Cephalosporin use.   Clindamycin/lincomycin Rash, Other (See Comments)   "turns me purple" "turns me orange"      Medication List    STOP taking these medications   ibuprofen 400 MG tablet Commonly known as:  ADVIL,MOTRIN   pravastatin 40 MG tablet Commonly known as:  PRAVACHOL     TAKE these medications   allopurinol 300 MG tablet Commonly known as:  ZYLOPRIM Take 1 tablet (300 mg total) by mouth daily as needed (gout).   ALPRAZolam 0.5 MG tablet Commonly known as:  XANAX Take 1 tablet (0.5 mg total) by mouth at bedtime as needed for anxiety.   AMBULATORY NON FORMULARY MEDICATION BiPAP machine @ 15/12 DX: OSA DX Code:   amphetamine-dextroamphetamine 20 MG tablet Commonly known as:  ADDERALL Take 1 tablet (20 mg total) by mouth daily.   aspirin 325 MG tablet Take 1 tablet (325 mg total) by mouth daily.   clotrimazole-betamethasone cream Commonly known as:  LOTRISONE Apply topically 2 (two) times daily. Apply to effected area   desonide 0.05 % cream Commonly known as:  DESOWEN Apply 1 application topically 2 (two) times daily.   diclofenac sodium 1 % Gel Commonly known as:  VOLTAREN Apply 4 g topically 4 (four) times daily. to the right lateral hip   FREESTYLE LITE test strip Generic drug:  glucose blood 1 each by Other route as directed.   gabapentin 100 MG capsule Commonly known as:  NEURONTIN Take 2 capsules (200 mg total) by mouth 3 (three) times daily.   insulin glargine 100 UNIT/ML injection Commonly known as:  LANTUS Inject 10 Units into the skin at bedtime.   insulin regular human CONCENTRATED 500 UNIT/ML injection Commonly known as:  HUMULIN R Inject 0.1 mLs (50 Units total) into the skin 2 (two) times daily with a meal.   lactulose 10 GM/15ML solution Commonly known as:  CHRONULAC Take 15 mLs (10 g total) by mouth  daily. Start taking on:  06/02/2018   lamoTRIgine 25 MG tablet Commonly known as:  LAMICTAL Take 2 tablets (50 mg total) by mouth 2 (two) times daily.   lidocaine 5 % Commonly known as:  LIDODERM Place 1 patch onto the skin daily. Remove & Discard patch within 12 hours or as directed by MD   liver oil-zinc oxide 40 % ointment Commonly known as:  DESITIN Apply topically 3 (three) times daily as needed for irritation.   Lurasidone HCl 60 MG Tabs Take 1 tablet (60 mg total) by mouth daily.   meloxicam 15 MG tablet Commonly known as:  MOBIC Take 1 tablet (15 mg total) by mouth daily. Start taking on:  06/02/2018   neomycin-bacitracin-polymyxin ointment Commonly known as:  NEOSPORIN Apply topically 2 (two) times daily.   nystatin cream Commonly known as:  MYCOSTATIN Apply topically 2 (two) times daily.   omeprazole 40 MG capsule Commonly known as:  PRILOSEC Take 1 capsule (40 mg total) by mouth daily.   tamsulosin 0.4 MG Caps capsule Commonly known as:  FLOMAX Take 1 capsule (0.4 mg total) by mouth daily.        DISCHARGE INSTRUCTIONS:   If you experience worsening of your admission symptoms, develop shortness of breath, life threatening  emergency, suicidal or homicidal thoughts you must seek medical attention immediately by calling 911 or calling your MD immediately  if symptoms less severe.  You Must read complete instructions/literature along with all the possible adverse reactions/side effects for all the Medicines you take and that have been prescribed to you. Take any new Medicines after you have completely understood and accept all the possible adverse reactions/side effects.   Please note  You were cared for by a hospitalist during your hospital stay. If you have any questions about your discharge medications or the care you received while you were in the hospital after you are discharged, you can call the unit and asked to speak with the hospitalist on call if the  hospitalist that took care of you is not available. Once you are discharged, your primary care physician will handle any further medical issues. Please note that NO REFILLS for any discharge medications will be authorized once you are discharged, as it is imperative that you return to your primary care physician (or establish a relationship with a primary care physician if you do not have one) for your aftercare needs so that they can reassess your need for medications and monitor your lab values.    Today   CHIEF COMPLAINT:   Chief Complaint  Patient presents with  . Weakness    HISTORY OF PRESENT ILLNESS:   77 y.o. male with a known history of CVA in the past, left-sided Bell's palsy left lower extremity weakness from prior CVA, dementia, carotid artery occlusion who is a resident of [facility] assisted living facility was brought to the emergency room today for generalized weakness.  Patient was found to be more weak according to the family.  He usually uses a walker to ambulate at the facility.  Patient has generalized fatigue.  He was evaluated in the emergency room hemoglobin is 9.5.  Usually baseline hemoglobin is around 11.5-12.  Patient is on aspirin as well as Motrin at the facility.  Patient states he had a colonoscopy done in February which was a normal study.  Stool guaiac was positive in the emergency room.  Initially was also worked up for stroke in the emergency room with a CT head which showed no acute abnormality except for old strokes.   VITAL SIGNS:  Blood pressure (!) 143/48, pulse (!) 59, temperature 98.7 F (37.1 C), temperature source Oral, resp. rate 16, height 5\' 10"  (1.778 m), weight 113.4 kg, SpO2 99 %.  I/O:    Intake/Output Summary (Last 24 hours) at 06/01/2018 1354 Last data filed at 06/01/2018 0646 Gross per 24 hour  Intake 0 ml  Output 1750 ml  Net -1750 ml    PHYSICAL EXAMINATION:  GENERAL:  77 y.o.-year-old patient lying in the bed with no acute  distress.  EYES: Pupils equal, round, reactive to light and accommodation. No scleral icterus. Extraocular muscles intact.  HEENT: Head atraumatic, normocephalic. Oropharynx and nasopharynx clear.  NECK:  Supple, no jugular venous distention. No thyroid enlargement, no tenderness.  LUNGS: Normal breath sounds bilaterally, no wheezing, rales,rhonchi or crepitation. No use of accessory muscles of respiration.  CARDIOVASCULAR: S1, S2 normal. No murmurs, rubs, or gallops.  ABDOMEN: Soft, non-tender, non-distended. Bowel sounds present. No organomegaly or mass.  EXTREMITIES: No pedal edema, cyanosis, or clubbing.  NEUROLOGIC: Cranial nerves II through XII are intact. Muscle strength 5/5 in all extremities. Sensation intact. Gait not checked.  PSYCHIATRIC: The patient is alert and oriented x 3.  SKIN: No obvious rash,  lesion, or ulcer.   DATA REVIEW:   CBC Recent Labs  Lab 05/28/18 0640  WBC 4.0  HGB 10.1*  HCT 30.0*  PLT 101*    Chemistries  Recent Labs  Lab 05/26/18 1111  05/28/18 0640  NA 137   < > 140  K 4.1   < > 4.0  CL 105   < > 107  CO2 26   < > 24  GLUCOSE 349*   < > 157*  BUN 14   < > 13  CREATININE 0.68   < > 0.74  CALCIUM 8.7*   < > 8.6*  AST 42*  --   --   ALT 20  --   --   ALKPHOS 80  --   --   BILITOT 2.1*  --   --    < > = values in this interval not displayed.    Cardiac Enzymes Recent Labs  Lab 05/26/18 1111  TROPONINI <0.03    Microbiology Results  Results for orders placed or performed during the hospital encounter of 05/26/18  MRSA PCR Screening     Status: None   Collection Time: 05/27/18  6:58 AM  Result Value Ref Range Status   MRSA by PCR NEGATIVE NEGATIVE Final    Comment:        The GeneXpert MRSA Assay (FDA approved for NASAL specimens only), is one component of a comprehensive MRSA colonization surveillance program. It is not intended to diagnose MRSA infection nor to guide or monitor treatment for MRSA infections. Performed at  [facility], [address], [place], [place] [address]     RADIOLOGY:  No results found.  EKG:   Orders placed or performed during the hospital encounter of 05/26/18  . EKG 12-Lead  . EKG 12-Lead  . ED EKG  . ED EKG      Management plans discussed with the patient, family and they are in agreement.  CODE STATUS:     Code Status Orders  (From admission, onward)         Start     Ordered   05/26/18 1706  Do not attempt resuscitation (DNR)  Continuous    Question Answer Comment  In the event of cardiac or respiratory ARREST Do not call a "code blue"   In the event of cardiac or respiratory ARREST Do not perform Intubation, CPR, defibrillation or ACLS   In the event of cardiac or respiratory ARREST Use medication by any route, position, wound care, and other measures to relive pain and suffering. May use oxygen, suction and manual treatment of airway obstruction as needed for comfort.      05/26/18 1705        Code Status History    Date Active Date Inactive Code Status Order ID Comments User Context   04/25/2018 1126 04/27/2018 2050 DNR [id number]  Bettey Costa, MD Inpatient   04/25/2018 0051 04/25/2018 1126 Full Code [id number]  Lance Coon, MD Inpatient    Advance Directive Documentation     Most Recent Value  Type of Advance Directive  Out of facility DNR (pink MOST or yellow form)  Pre-existing out of facility DNR order (yellow form or pink MOST form)  -  "MOST" Form in Place?  -      TOTAL TIME TAKING CARE OF THIS PATIENT: 45 minutes.    Avel Peace Arnoldo Hildreth M.D on 06/01/2018 at 1:54 PM  Between 7am to 6pm - Pager - [phone]  After 6pm go  to [email or web address] - password [email or web address] [place] [facility]  Office  [phone]  CC: Primary care physician; Idelle Crouch, MD   Note: This dictation was prepared with Dragon dictation along with smaller phrase technology. Any transcriptional errors that result from this process are  unintentional.

## 2018-06-01 NOTE — Progress Notes (Signed)
[place] at [place] NAME: [name] Day    MR#:  [id number]  DATE OF BIRTH:  Nov 22, 1940  SUBJECTIVE:  CHIEF COMPLAINT:   Chief Complaint  Patient presents with  . Weakness  Patient is without complaint, son at the bedside, awaiting placement REVIEW OF SYSTEMS:  CONSTITUTIONAL: No fever, fatigue or weakness.  EYES: No blurred or double vision.  EARS, NOSE, AND THROAT: No tinnitus or ear pain.  RESPIRATORY: No cough, shortness of breath, wheezing or hemoptysis.  CARDIOVASCULAR: No chest pain, orthopnea, edema.  GASTROINTESTINAL: No nausea, vomiting, diarrhea or abdominal pain.  GENITOURINARY: No dysuria, hematuria.  ENDOCRINE: No polyuria, nocturia,  HEMATOLOGY: No anemia, easy bruising or bleeding SKIN: No rash or lesion. MUSCULOSKELETAL: No joint pain or arthritis.   NEUROLOGIC: No tingling, numbness, weakness.  PSYCHIATRY: No anxiety or depression.   ROS  DRUG ALLERGIES:   Allergies  Allergen Reactions  . Lisinopril Other (See Comments)    REACTION: Cough  . Penicillins Other (See Comments)    Reaction: unknown Has patient had a PCN reaction causing immediate rash, facial/tongue/throat swelling, SOB or lightheadedness with hypotension: Unknown Has patient had a PCN reaction causing severe rash involving mucus membranes or skin necrosis: Unknown Has patient had a PCN reaction that required hospitalization: Unknown Has patient had a PCN reaction occurring within the last 10 years: Unknown If all of the above answers are "NO", then may proceed with Cephalosporin use.   . Clindamycin/Lincomycin Rash and Other (See Comments)    "turns me purple" "turns me orange"    VITALS:  Blood pressure (!) 143/56, pulse 61, temperature 98 F (36.7 C), temperature source Oral, resp. rate 20, height 5\' 10"  (1.778 m), weight 113.4 kg, SpO2 99 %.  PHYSICAL EXAMINATION:  GENERAL:  77 y.o.-year-old patient lying in the bed with no acute  distress.  EYES: Pupils equal, round, reactive to light and accommodation. No scleral icterus. Extraocular muscles intact.  HEENT: Head atraumatic, normocephalic. Oropharynx and nasopharynx clear.  NECK:  Supple, no jugular venous distention. No thyroid enlargement, no tenderness.  LUNGS: Normal breath sounds bilaterally, no wheezing, rales,rhonchi or crepitation. No use of accessory muscles of respiration.  CARDIOVASCULAR: S1, S2 normal. No murmurs, rubs, or gallops.  ABDOMEN: Soft, nontender, nondistended. Bowel sounds present. No organomegaly or mass.  EXTREMITIES: No pedal edema, cyanosis, or clubbing.  NEUROLOGIC: Cranial nerves II through XII are intact. Muscle strength 5/5 in all extremities. Sensation intact. Gait not checked.  PSYCHIATRIC: The patient is alert and oriented x 3.  SKIN: No obvious rash, lesion, or ulcer.   Physical Exam LABORATORY PANEL:   CBC Recent Labs  Lab 05/28/18 0640  WBC 4.0  HGB 10.1*  HCT 30.0*  PLT 101*   ------------------------------------------------------------------------------------------------------------------  Chemistries  Recent Labs  Lab 05/26/18 1111  05/28/18 0640  NA 137   < > 140  K 4.1   < > 4.0  CL 105   < > 107  CO2 26   < > 24  GLUCOSE 349*   < > 157*  BUN 14   < > 13  CREATININE 0.68   < > 0.74  CALCIUM 8.7*   < > 8.6*  AST 42*  --   --   ALT 20  --   --   ALKPHOS 80  --   --   BILITOT 2.1*  --   --    < > = values in this interval not displayed.   ------------------------------------------------------------------------------------------------------------------  Cardiac Enzymes Recent Labs  Lab 05/26/18 1111  TROPONINI <0.03   ------------------------------------------------------------------------------------------------------------------  RADIOLOGY:  No results found.  ASSESSMENT AND PLAN:  77 year old elderly male patient from [facility] assisted living facility with history of CVA, Bell's palsy,  diabetes mellitus type 2,, carotid artery occlusion presented to the emergency room for weakness and fatigue  *Acute encephalopathy Resolved Most likely due to multifactorial process of obstructive sleep apnea, hyperammonemia, and medication side effect-refuses to wear hospital CPAP, home CPAP is dysfunctional Previous history of stroke unable to have MRI Neurology did see patient while in house  ABG shows no evidence of CO2 retention Elevated ammonia level resolved with lactulose, patient continues to refuse CPAP-son is very much aware as this is a chronic issue for the patient for the last 5 years per son, aspiration/fall precautions while in house  *Anemia with guaiac positive stool Hemoglobin is 10/stable Gastroenterology input appreciated-no intervention recommended  *History of CVA and Bell's palsy Stable Repeat CT head no evidence of stroke 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 and MRI of the brain cannot be done Statin discontinued due to elevated ammonia as per recommendation per neurology  *Chronic diabetes mellitus type 2  Stable Sliding scale insulin with Accu-Cheks per routine   *Obstructive sleep apnea  Patient refusing hospital CPAP, home CPAP device is dysfunctional -son to try to fix later today We will encourage patient to use hospital CPAP   *Acute on chronic noncompliance with medical management The importance of compliance was again reiterated to the patient as well as the patient's son  Disposition to skilled nursing facility once cleared by insurance   All the records are reviewed and case discussed with Care Management/Social Workerr. Management plans discussed with the patient, family and they are in agreement.  CODE STATUS: dnr  TOTAL TIME TAKING CARE OF THIS PATIENT: 40 minutes.   POSSIBLE D/C IN 2 DAYS, DEPENDING ON CLINICAL CONDITION.   Avel Peace Salary M.D on 06/01/2018   Between 7am to 6pm - Pager -  [phone]  After 6pm go to [email or web address] - password [email or web address] [place] [facility]  Office  [phone]  CC: Primary care physician; Idelle Crouch, MD  Note: This dictation was prepared with Dragon dictation along with smaller phrase technology. Any transcriptional errors that result from this process are unintentional.

## 2018-06-01 NOTE — Clinical Social Work Note (Signed)
Continuing to wait on Level 2 pasrr. MD aware. Shela Leff MSW,LCSW [phone]

## 2018-06-02 ENCOUNTER — Encounter: Payer: Self-pay | Admitting: Urology

## 2018-06-02 ENCOUNTER — Ambulatory Visit: Payer: Medicare Other

## 2018-06-02 ENCOUNTER — Telehealth: Payer: Self-pay | Admitting: Urology

## 2018-06-02 ENCOUNTER — Ambulatory Visit (INDEPENDENT_AMBULATORY_CARE_PROVIDER_SITE_OTHER): Payer: Medicare Other | Admitting: Urology

## 2018-06-02 VITALS — BP 133/61 | HR 70 | Ht 69.0 in | Wt 264.0 lb

## 2018-06-02 DIAGNOSIS — N481 Balanitis: Secondary | ICD-10-CM

## 2018-06-02 DIAGNOSIS — N401 Enlarged prostate with lower urinary tract symptoms: Secondary | ICD-10-CM

## 2018-06-02 DIAGNOSIS — N138 Other obstructive and reflux uropathy: Secondary | ICD-10-CM | POA: Diagnosis not present

## 2018-06-02 DIAGNOSIS — K7290 Hepatic failure, unspecified without coma: Secondary | ICD-10-CM | POA: Insufficient documentation

## 2018-06-02 NOTE — Telephone Encounter (Signed)
Pt was supposed to get a RX for cream and to increase his FloMax.  Pt said it needs to be called in to nursing home [facility])

## 2018-06-03 ENCOUNTER — Encounter: Payer: Self-pay | Admitting: Adult Health

## 2018-06-03 ENCOUNTER — Non-Acute Institutional Stay (SKILLED_NURSING_FACILITY): Payer: Medicare Other | Admitting: Adult Health

## 2018-06-03 ENCOUNTER — Encounter: Payer: Self-pay | Admitting: Urology

## 2018-06-03 DIAGNOSIS — E1165 Type 2 diabetes mellitus with hyperglycemia: Secondary | ICD-10-CM

## 2018-06-03 DIAGNOSIS — IMO0002 Reserved for concepts with insufficient information to code with codable children: Secondary | ICD-10-CM

## 2018-06-03 DIAGNOSIS — E118 Type 2 diabetes mellitus with unspecified complications: Secondary | ICD-10-CM | POA: Diagnosis not present

## 2018-06-03 MED ORDER — TAMSULOSIN HCL 0.4 MG PO CAPS: 0.8000 mg | ORAL_CAPSULE | Freq: Every day | ORAL | 0 refills | Status: DC

## 2018-06-03 MED ORDER — NYSTATIN-TRIAMCINOLONE 100000-0.1 UNIT/GM-% EX CREA: 1.0000 "application " | TOPICAL_CREAM | Freq: Two times a day (BID) | CUTANEOUS | 0 refills | Status: DC

## 2018-06-03 NOTE — Telephone Encounter (Signed)
Ben notifiedthe YP'P for patients have been sent to [facility].

## 2018-06-03 NOTE — Progress Notes (Signed)
Location:   [facility] Room Number: [place] of Service:  SNF (31)   CODE STATUS: DNR  Allergies  Allergen Reactions  . Lisinopril Other (See Comments)    REACTION: Cough  . Penicillins Other (See Comments)    Reaction: unknown Has patient had a PCN reaction causing immediate rash, facial/tongue/throat swelling, SOB or lightheadedness with hypotension: Unknown Has patient had a PCN reaction causing severe rash involving mucus membranes or skin necrosis: Unknown Has patient had a PCN reaction that required hospitalization: Unknown Has patient had a PCN reaction occurring within the last 10 years: Unknown If all of the above answers are "NO", then may proceed with Cephalosporin use.   . Clindamycin/Lincomycin Rash and Other (See Comments)    "turns me purple" "turns me orange"    Chief Complaint  Patient presents with  . Acute Visit    DM    HPI:  His cbg readings are elevated. He is concerned that he has experienced a hypoglycemic episode; however; his since of time is not correct that incident occurred several weeks back. He is willing to take his insulin as ordered; if his cbg is being monitored. I have reassured him that the staff will monitor him. He denies any excessive thirst or hunger. He denies any anxiety.    Past Medical History:  Diagnosis Date  . Anemia   . Bipolar 1 disorder ([place])   . Carotid artery occlusion   . Dementia ([place])   . Diabetes mellitus without complication ([place])   . Diabetic peripheral neuropathy ([place]) 03/03/2018  . Gait abnormality 02/12/2017  . Left-sided Bell's palsy 03/03/2018  . Memory disorder 02/12/2017  . TIA (transient ischemic attack)     Past Surgical History:  Procedure Laterality Date  . COLONOSCOPY WITH PROPOFOL N/A 02/25/2017   Procedure: COLONOSCOPY WITH PROPOFOL;  Surgeon: Manya Silvas, MD;  Location: [facility] ENDOSCOPY;  Service: Endoscopy;  Laterality: N/A;  . ESOPHAGOGASTRODUODENOSCOPY (EGD)  WITH PROPOFOL N/A 02/25/2017   Procedure: ESOPHAGOGASTRODUODENOSCOPY (EGD) WITH PROPOFOL;  Surgeon: Manya Silvas, MD;  Location: [facility] ENDOSCOPY;  Service: Endoscopy;  Laterality: N/A;  . NASAL SINUS SURGERY    . PACEMAKER PLACEMENT    . TONSILLECTOMY    . TONSILLECTOMY      Social History   Socioeconomic History  . Marital status: Widowed    Spouse name: Not on file  . Number of children: 2  . Years of education: 37  . Highest education level: Not on file  Occupational History  . Occupation: Retired  Scientific laboratory technician  . Financial resource strain: Not on file  . Food insecurity:    Worry: Never true    Inability: Never true  . Transportation needs:    Medical: No    Non-medical: No  Tobacco Use  . Smoking status: Former Smoker    Packs/day: 1.00    Types: Cigarettes, Cigars  . Smokeless tobacco: Never Used  Substance and Sexual Activity  . Alcohol use: Yes    Comment: occasional  . Drug use: No  . Sexual activity: Not Currently  Lifestyle  . Physical activity:    Days per week: 1 day    Minutes per session: 10 min  . Stress: Not at all  Relationships  . Social connections:    Talks on phone: Patient refused    Gets together: Patient refused    Attends religious service: Patient refused    Active member of club or organization: Patient refused  Attends meetings of clubs or organizations: Patient refused    Relationship status: Patient refused  . Intimate partner violence:    Fear of current or ex partner: Patient refused    Emotionally abused: Patient refused    Physically abused: Patient refused    Forced sexual activity: Patient refused  Other Topics Concern  . Not on file  Social History Narrative   Lives with [name]   Caffeine use: caffeine free soda daily   Right handed   Family History  Problem Relation Age of Onset  . Allergies Mother   . Diabetes Father   . Diabetes Sister       VITAL SIGNS BP (!) 156/49   Pulse (!) 59   Temp  98.2 F (36.8 C)   Resp 18   Ht 5\' 9"  (1.753 m)   Wt 246 lb 1.6 oz (111.6 kg)   SpO2 97%   BMI 36.34 kg/m   Outpatient Encounter Medications as of 06/03/2018  Medication Sig  . allopurinol (ZYLOPRIM) 300 MG tablet Take 1 tablet (300 mg total) by mouth daily as needed (gout).  . ALPRAZolam (XANAX) 0.5 MG tablet Take 1 tablet (0.5 mg total) by mouth at bedtime as needed for anxiety.  . AMBULATORY NON FORMULARY MEDICATION BiPAP machine @ 15/12 DX: OSA DX Code:  . amphetamine-dextroamphetamine (ADDERALL) 20 MG tablet Take 1 tablet (20 mg total) by mouth daily.  . Continuous Blood Gluc Sensor (DEXCOM G6 SENSOR) MISC Use 1 each every 30 (thirty) days  . Continuous Blood Gluc Transmit (DEXCOM G6 TRANSMITTER) MISC Use 1 Device 4 (four) times daily  . diclofenac sodium (VOLTAREN) 1 % GEL Apply 4 g topically 4 (four) times daily. to the right lateral hip  . FREESTYLE LITE test strip 1 each by Other route as directed.  . gabapentin (NEURONTIN) 100 MG capsule Take 2 capsules (200 mg total) by mouth 3 (three) times daily.  . insulin glargine (LANTUS) 100 UNIT/ML injection Inject 10 Units into the skin at bedtime.  . insulin regular human CONCENTRATED (HUMULIN R) 500 UNIT/ML injection Inject 0.1 mLs (50 Units total) into the skin 2 (two) times daily with a meal.  . lactulose (CHRONULAC) 10 GM/15ML solution Take 15 mLs (10 g total) by mouth daily.  Marland Kitchen lamoTRIgine (LAMICTAL) 25 MG tablet Take 2 tablets (50 mg total) by mouth 2 (two) times daily.  Marland Kitchen lidocaine (LIDODERM) 5 % Place 1 patch onto the skin daily. Remove & Discard patch within 12 hours or as directed by MD  . liver oil-zinc oxide (DESITIN) 40 % ointment Apply topically 3 (three) times daily as needed for irritation.  . Lurasidone HCl (LATUDA) 60 MG TABS Take 1 tablet (60 mg total) by mouth daily.  . meloxicam (MOBIC) 15 MG tablet Take 1 tablet (15 mg total) by mouth daily.  . NON FORMULARY Diet Type: NCS  . nystatin cream (MYCOSTATIN) Apply  topically 2 (two) times daily.  Marland Kitchen omeprazole (PRILOSEC) 40 MG capsule Take 1 capsule (40 mg total) by mouth daily.  . tamsulosin (FLOMAX) 0.4 MG CAPS capsule Take 2 capsules (0.8 mg total) by mouth daily.   No facility-administered encounter medications on file as of 06/03/2018.      SIGNIFICANT DIAGNOSTIC EXAMS  LABS REVIEWED; PREVIOUS:   04-25-18: hgb a1c 9.1; chol 152; ldl 96; trig 103; hdl 35   NO NEW LABS.    Review of Systems  Constitutional: Negative for malaise/fatigue.  Respiratory: Negative for cough and shortness of breath.  Cardiovascular: Negative for chest pain, palpitations and leg swelling.  Gastrointestinal: Negative for abdominal pain, constipation and heartburn.  Musculoskeletal: Negative for back pain, joint pain and myalgias.  Skin: Negative.   Neurological: Negative for dizziness.  Psychiatric/Behavioral: The patient is not nervous/anxious.     Physical Exam  Constitutional: He appears well-developed and well-nourished. No distress.  Obese   Neck: No thyromegaly present.  Cardiovascular: Normal rate, regular rhythm, normal heart sounds and intact distal pulses.  Pulmonary/Chest: Effort normal and breath sounds normal. No respiratory distress.  Abdominal: Soft. Bowel sounds are normal. He exhibits no distension. There is no tenderness.  Musculoskeletal: He exhibits no edema.  Is able to move all extremities   Lymphadenopathy:    He has no cervical adenopathy.  Neurological: He is alert.  Skin: Skin is warm and dry. He is not diaphoretic.  Psychiatric: He has a normal mood and affect.     ASSESSMENT/ PLAN:  TODAY:   1.  Type II diabetes mellitus with manifestations uncontrolled: cbgs are all elevated: hgb a1c 9.1; will continue asa 325 mg daily  humulin R 50 units twice daily and will increase lantus to 15 units nightly     MD is aware of resident's narcotic use and is in agreement with current plan of care. We will attempt to wean resident as  apropriate   Ok Edwards NP [facility]  Contact [phone] Monday through Friday 8am- 5pm  After hours call [phone]

## 2018-06-03 NOTE — Telephone Encounter (Signed)
Patient's son Boen Sterbenz [phone]) called the office this morning.    The patient's medications (Flomax and Mycolog II) need to be cancelled with [facility].  Patient is at [facility] facility [phone]) and needs to have all medications sent to the facility.    Please confirm with the patient's son when this has been completed.

## 2018-06-03 NOTE — Progress Notes (Signed)
06/02/2018 6:31 AM   [name] Day [birth year] [id number]  Referring provider: Idelle Crouch, MD [place] [place], [place] [address]  Chief Complaint  Patient presents with  . Groin Swelling  . Groin Pain    HPI: 77 year old male presents for evaluation of penile pain/inflammation and lower urinary tract symptoms.  He has a long history of recurrent balanitis which she attributes to a fungal infection he "picked up in [place] 30-40 years ago".  He was hospitalized on 05/26/2018 for anemia and a lower GI bleed.  He states during this hospitalization he had worsening lower urinary tract symptoms and urinary incontinence and developed a fungal infection in the groin areas and pain and swelling of his glans.  He was started on nystatin for his groin rash which is improving.  He complains of persistent pain and swelling of the penis.  He is not circumcised.  He is currently on tamsulosin 0.4 mg.  He does complain of urinary hesitancy and decreased force and caliber of his urinary stream.   PMH: Past Medical History:  Diagnosis Date  . Anemia   . Bipolar 1 disorder ([place])   . Carotid artery occlusion   . Dementia ([place])   . Diabetes mellitus without complication ([place])   . Diabetic peripheral neuropathy ([place]) 03/03/2018  . Gait abnormality 02/12/2017  . Left-sided Bell's palsy 03/03/2018  . Memory disorder 02/12/2017  . TIA (transient ischemic attack)     Surgical History: Past Surgical History:  Procedure Laterality Date  . COLONOSCOPY WITH PROPOFOL N/A 02/25/2017   Procedure: COLONOSCOPY WITH PROPOFOL;  Surgeon: Manya Silvas, MD;  Location: [facility] ENDOSCOPY;  Service: Endoscopy;  Laterality: N/A;  . ESOPHAGOGASTRODUODENOSCOPY (EGD) WITH PROPOFOL N/A 02/25/2017   Procedure: ESOPHAGOGASTRODUODENOSCOPY (EGD) WITH PROPOFOL;  Surgeon: Manya Silvas, MD;  Location: [facility] ENDOSCOPY;  Service: Endoscopy;  Laterality: N/A;  . NASAL SINUS SURGERY    .  PACEMAKER PLACEMENT    . TONSILLECTOMY    . TONSILLECTOMY      Home Medications:  Allergies as of 06/02/2018      Reactions   Lisinopril Other (See Comments)   REACTION: Cough   Penicillins Other (See Comments)   Reaction: unknown Has patient had a PCN reaction causing immediate rash, facial/tongue/throat swelling, SOB or lightheadedness with hypotension: Unknown Has patient had a PCN reaction causing severe rash involving mucus membranes or skin necrosis: Unknown Has patient had a PCN reaction that required hospitalization: Unknown Has patient had a PCN reaction occurring within the last 10 years: Unknown If all of the above answers are "NO", then may proceed with Cephalosporin use.   Clindamycin/lincomycin Rash, Other (See Comments)   "turns me purple" "turns me orange"      Medication List        Accurate as of 06/02/18 11:59 PM. Always use your most recent med list.          allopurinol 300 MG tablet Commonly known as:  ZYLOPRIM Take 1 tablet (300 mg total) by mouth daily as needed (gout).   ALPRAZolam 0.5 MG tablet Commonly known as:  XANAX Take 1 tablet (0.5 mg total) by mouth at bedtime as needed for anxiety.   AMBULATORY NON FORMULARY MEDICATION BiPAP machine @ 15/12 DX: OSA DX Code:   amphetamine-dextroamphetamine 20 MG tablet Commonly known as:  ADDERALL Take 1 tablet (20 mg total) by mouth daily.   aspirin 325 MG tablet Take 1 tablet (325 mg total) by mouth daily.  clotrimazole-betamethasone cream Commonly known as:  LOTRISONE Apply topically 2 (two) times daily. Apply to effected area   desonide 0.05 % cream Commonly known as:  DESOWEN Apply 1 application topically 2 (two) times daily.   DEXCOM G6 SENSOR Misc Use 1 each every 30 (thirty) days   DEXCOM G6 TRANSMITTER Misc Use 1 Device 4 (four) times daily   diclofenac sodium 1 % Gel Commonly known as:  VOLTAREN Apply 4 g topically 4 (four) times daily. to the right lateral hip   FREESTYLE  LITE test strip Generic drug:  glucose blood 1 each by Other route as directed.   gabapentin 100 MG capsule Commonly known as:  NEURONTIN Take 2 capsules (200 mg total) by mouth 3 (three) times daily.   insulin glargine 100 UNIT/ML injection Commonly known as:  LANTUS Inject 10 Units into the skin at bedtime.   insulin regular human CONCENTRATED 500 UNIT/ML injection Commonly known as:  HUMULIN R Inject 0.1 mLs (50 Units total) into the skin 2 (two) times daily with a meal.   lactulose 10 GM/15ML solution Commonly known as:  CHRONULAC Take 15 mLs (10 g total) by mouth daily.   lamoTRIgine 25 MG tablet Commonly known as:  LAMICTAL Take 2 tablets (50 mg total) by mouth 2 (two) times daily.   lidocaine 5 % Commonly known as:  LIDODERM Place 1 patch onto the skin daily. Remove & Discard patch within 12 hours or as directed by MD   liver oil-zinc oxide 40 % ointment Commonly known as:  DESITIN Apply topically 3 (three) times daily as needed for irritation.   Lurasidone HCl 60 MG Tabs Take 1 tablet (60 mg total) by mouth daily.   meloxicam 15 MG tablet Commonly known as:  MOBIC Take 1 tablet (15 mg total) by mouth daily.   neomycin-bacitracin-polymyxin ointment Commonly known as:  NEOSPORIN Apply topically 2 (two) times daily.   nystatin cream Commonly known as:  MYCOSTATIN Apply topically 2 (two) times daily.   omeprazole 40 MG capsule Commonly known as:  PRILOSEC Take 1 capsule (40 mg total) by mouth daily.   tamsulosin 0.4 MG Caps capsule Commonly known as:  FLOMAX Take 1 capsule (0.4 mg total) by mouth daily.       Allergies:  Allergies  Allergen Reactions  . Lisinopril Other (See Comments)    REACTION: Cough  . Penicillins Other (See Comments)    Reaction: unknown Has patient had a PCN reaction causing immediate rash, facial/tongue/throat swelling, SOB or lightheadedness with hypotension: Unknown Has patient had a PCN reaction causing severe rash  involving mucus membranes or skin necrosis: Unknown Has patient had a PCN reaction that required hospitalization: Unknown Has patient had a PCN reaction occurring within the last 10 years: Unknown If all of the above answers are "NO", then may proceed with Cephalosporin use.   . Clindamycin/Lincomycin Rash and Other (See Comments)    "turns me purple" "turns me orange"    Family History: Family History  Problem Relation Age of Onset  . Allergies Mother   . Diabetes Father   . Diabetes Sister     Social History:  reports that he has quit smoking. His smoking use included cigarettes and cigars. He smoked 1.00 pack per day. He has never used smokeless tobacco. He reports that he drinks alcohol. He reports that he does not use drugs.  ROS: UROLOGY Frequent Urination?: No Hard to postpone urination?: No Burning/pain with urination?: No Get up at night to urinate?: No  Leakage of urine?: No Urine stream starts and stops?: No Trouble starting stream?: Yes Do you have to strain to urinate?: No Blood in urine?: No Urinary tract infection?: No Sexually transmitted disease?: No Injury to kidneys or bladder?: No Painful intercourse?: No Weak stream?: No Erection problems?: No Penile pain?: No  Gastrointestinal Nausea?: No Vomiting?: No Indigestion/heartburn?: No Diarrhea?: No Constipation?: No  Constitutional Fever: No Night sweats?: No Weight loss?: No Fatigue?: No  Skin Skin rash/lesions?: Yes Itching?: No  Eyes Blurred vision?: No Double vision?: No  Ears/Nose/Throat Sore throat?: No Sinus problems?: No  Hematologic/Lymphatic Swollen glands?: No Easy bruising?: No  Cardiovascular Leg swelling?: No Chest pain?: No  Respiratory Cough?: No Shortness of breath?: No  Endocrine Excessive thirst?: No  Musculoskeletal Back pain?: No Joint pain?: No  Neurological Headaches?: No Dizziness?: No  Psychologic Depression?: No Anxiety?: No  Physical  Exam: BP 133/61 (BP Location: Left Arm, Patient Position: Sitting, Cuff Size: Large)   Pulse 70   Ht 5\' 9"  (1.753 m)   Wt 264 lb (119.7 kg)   BMI 38.99 kg/m   Constitutional:  Alert and oriented, No acute distress. HEENT: [place] AT, moist mucus membranes.  Trachea midline, no masses. Cardiovascular: No clubbing, cyanosis, or edema. Respiratory: Normal respiratory effort, no increased work of breathing. GI: Abdomen is soft, nontender, nondistended, no abdominal masses GU: No CVA tenderness.  Faint bilateral erythematous rash in groin crease.  Phallus uncircumcised.  No paraphimosis.  Foreskin is tight but retractable.  Mild erythema glands and edema inner prepuce. Lymph: No cervical or inguinal lymphadenopathy. Skin: No rashes, bruises or suspicious lesions. Neurologic: Grossly intact, no focal deficits, moving all 4 extremities. Psychiatric: Normal mood and affect.   Assessment & Plan:   77 year old male with recurrent balanitis.  Rx nystatin/triamcinolone was sent to pharmacy.  He has a long history of recurrent balanitis and I did discuss dorsal slit in the future.  He is not interested in circumcision.  He has no orthostatic symptoms with tamsulosin 0.4 mg.  He is currently only ambulatory with assistance on a walker.  Will increase his tamsulosin briefly to 0.8 mg and he was cautioned on the possibility of orthostatic symptoms.   Abbie Sons, [place] [address] [place], [place] [address] [phone]

## 2018-06-04 ENCOUNTER — Non-Acute Institutional Stay (SKILLED_NURSING_FACILITY): Payer: Medicare Other | Admitting: Adult Health

## 2018-06-04 ENCOUNTER — Encounter: Payer: Self-pay | Admitting: Adult Health

## 2018-06-04 DIAGNOSIS — E118 Type 2 diabetes mellitus with unspecified complications: Secondary | ICD-10-CM

## 2018-06-04 DIAGNOSIS — F319 Bipolar disorder, unspecified: Secondary | ICD-10-CM

## 2018-06-04 DIAGNOSIS — IMO0002 Reserved for concepts with insufficient information to code with codable children: Secondary | ICD-10-CM

## 2018-06-04 DIAGNOSIS — Z8673 Personal history of transient ischemic attack (TIA), and cerebral infarction without residual deficits: Secondary | ICD-10-CM

## 2018-06-04 DIAGNOSIS — E1165 Type 2 diabetes mellitus with hyperglycemia: Secondary | ICD-10-CM | POA: Diagnosis not present

## 2018-06-04 NOTE — Progress Notes (Signed)
[facility] Number: [id number] Place of Service:  SNF (31) Provider:  Durenda Age, NP  Patient Care Team: Idelle Crouch, MD as PCP - General (Internal Medicine)  Extended Emergency Contact Information Primary Emergency Contact: Del[place], [place] Phone: [phone] Relation: Son Secondary Emergency Contact: Remi Deter [place] Phone: [phone] Relation: Son  Code Status:  DNR  Goals of care: Advanced Directive information Advanced Directives 06/04/2018  Does Patient Have a Medical Advance Directive? Yes  Type of Advance Directive Out of facility DNR (pink MOST or yellow form)  Does patient want to make changes to medical advance directive? Yes (Inpatient - patient requests chaplain consult to change a medical advance directive)  Copy of [place] in Chart? -  Would patient like information on creating a medical advance directive? -  Pre-existing out of facility DNR order (yellow form or pink MOST form) -     Chief Complaint  Patient presents with  . Acute Visit    Patient is seen for diabetes management    HPI:  Pt is a 77 y.o. male seen today for acute visit for diabetic medication management.  He is a short-term rehabilitation resident at [facility].  He has a PMH of bipolar disorder, dementia, diabetes mellitus with peripheral neuropathy, left-sided Bell's palsy, and TIA. He stated that he does not want to take Humulin R U-500 50 units BID. He said that he is scared that he might have low blood sugar. He has been refusing Humulin R U-500.  CBGs 166, 353, 359, 316, 323.   Past Medical History:  Diagnosis Date  . Anemia   . Bipolar 1 disorder ([place])   . Carotid artery occlusion   . Dementia ([place])   . Diabetes mellitus without complication ([place])   . Diabetic peripheral neuropathy ([place]) 03/03/2018  . Gait abnormality 02/12/2017  . Left-sided Bell's palsy  03/03/2018  . Memory disorder 02/12/2017  . TIA (transient ischemic attack)    Past Surgical History:  Procedure Laterality Date  . COLONOSCOPY WITH PROPOFOL N/A 02/25/2017   Procedure: COLONOSCOPY WITH PROPOFOL;  Surgeon: Manya Silvas, MD;  Location: [facility] ENDOSCOPY;  Service: Endoscopy;  Laterality: N/A;  . ESOPHAGOGASTRODUODENOSCOPY (EGD) WITH PROPOFOL N/A 02/25/2017   Procedure: ESOPHAGOGASTRODUODENOSCOPY (EGD) WITH PROPOFOL;  Surgeon: Manya Silvas, MD;  Location: [facility] ENDOSCOPY;  Service: Endoscopy;  Laterality: N/A;  . NASAL SINUS SURGERY    . PACEMAKER PLACEMENT    . TONSILLECTOMY    . TONSILLECTOMY      Allergies  Allergen Reactions  . Lisinopril Other (See Comments)    REACTION: Cough  . Penicillins Other (See Comments)    Reaction: unknown Has patient had a PCN reaction causing immediate rash, facial/tongue/throat swelling, SOB or lightheadedness with hypotension: Unknown Has patient had a PCN reaction causing severe rash involving mucus membranes or skin necrosis: Unknown Has patient had a PCN reaction that required hospitalization: Unknown Has patient had a PCN reaction occurring within the last 10 years: Unknown If all of the above answers are "NO", then may proceed with Cephalosporin use.   . Clindamycin/Lincomycin Rash and Other (See Comments)    "turns me purple" "turns me orange"    Outpatient Encounter Medications as of 06/04/2018  Medication Sig  . allopurinol (ZYLOPRIM) 300 MG tablet Take 1 tablet (300 mg total) by mouth daily as needed (gout).  . ALPRAZolam (XANAX) 0.5 MG tablet Take 1 tablet (0.5 mg total) by  mouth at bedtime as needed for anxiety.  . AMBULATORY NON FORMULARY MEDICATION BiPAP machine @ 15/12 DX: OSA DX Code:  . amphetamine-dextroamphetamine (ADDERALL) 20 MG tablet Take 1 tablet (20 mg total) by mouth daily.  Marland Kitchen aspirin 325 MG tablet Take 325 mg by mouth daily.  . diclofenac sodium (VOLTAREN) 1 % GEL Apply 4 g topically daily. Apply to  right hip for pain  . gabapentin (NEURONTIN) 100 MG capsule Take 2 capsules (200 mg total) by mouth 3 (three) times daily.  . insulin aspart (NOVOLOG) 100 UNIT/ML injection Inject 2-15 Units into the skin 3 (three) times daily before meals. SSI:  70-100 = 0 units, 121-150 = 2 units, 151-200 = 3 units, 201-250 = 5 units, 251-300 = 8 units, 301-350 = 11 units, 351-400 = 15 units, >400 call provider.  . insulin aspart (NOVOLOG) 100 UNIT/ML injection Inject 2-5 Units into the skin at bedtime. SSI:  201-250 = 2 units, 251-300 = 3 units, 301-350 = 4 units, 351-400 = 5 units, >400 call provider  . insulin glargine (LANTUS) 100 UNIT/ML injection Inject 15 Units into the skin at bedtime.   Marland Kitchen lactulose (CHRONULAC) 10 GM/15ML solution Take 15 mLs (10 g total) by mouth daily.  Marland Kitchen lamoTRIgine (LAMICTAL) 25 MG tablet Take 2 tablets (50 mg total) by mouth 2 (two) times daily.  Marland Kitchen lidocaine (LIDODERM) 5 % Place 1 patch onto the skin daily. Remove & Discard patch within 12 hours or as directed by MD  . liver oil-zinc oxide (DESITIN) 40 % ointment Apply topically 3 (three) times daily as needed for irritation.  . Lurasidone HCl (LATUDA) 60 MG TABS Take 1 tablet (60 mg total) by mouth daily.  . meloxicam (MOBIC) 15 MG tablet Take 1 tablet (15 mg total) by mouth daily.  . NON FORMULARY Diet Type: NCS  . nystatin cream (MYCOSTATIN) Apply topically 2 (two) times daily.  Marland Kitchen omeprazole (PRILOSEC) 40 MG capsule Take 1 capsule (40 mg total) by mouth daily.  . tamsulosin (FLOMAX) 0.4 MG CAPS capsule Take 2 capsules (0.8 mg total) by mouth daily.  . [DISCONTINUED] diclofenac sodium (VOLTAREN) 1 % GEL Apply 4 g topically 4 (four) times daily. to the right lateral hip  . Continuous Blood Gluc Sensor (DEXCOM G6 SENSOR) MISC Use 1 each every 30 (thirty) days  . Continuous Blood Gluc Transmit (DEXCOM G6 TRANSMITTER) MISC Use 1 Device 4 (four) times daily  . FREESTYLE LITE test strip 1 each by Other route as directed. (Patient not  taking: Reported on 06/04/2018)  . [DISCONTINUED] insulin regular human CONCENTRATED (HUMULIN R) 500 UNIT/ML injection Inject 0.1 mLs (50 Units total) into the skin 2 (two) times daily with a meal.   No facility-administered encounter medications on file as of 06/04/2018.     Review of Systems  GENERAL: No change in appetite, no fatigue, no weight changes, no fever, chills or weakness MOUTH and THROAT: Denies oral discomfort, gingival pain or bleeding RESPIRATORY: no cough, SOB, DOE, wheezing, hemoptysis CARDIAC: No chest pain, or palpitations GI: No abdominal pain, diarrhea, constipation, heart burn, nausea or vomiting GU: Denies dysuria, frequency, hematuria, incontinence, or discharge PSYCHIATRIC: Denies feelings of depression or anxiety. No report of hallucinations, insomnia, paranoia, or agitation   Immunization History  Administered Date(s) Administered  . Influenza Split 06/21/2014  . Influenza Whole 06/01/2008  . Influenza, High Dose Seasonal PF 05/27/2018  . Influenza-Unspecified 06/17/2017  . Pneumococcal Polysaccharide-23 09/01/2008, 06/21/2014   Pertinent  Health Maintenance Due  Topic Date Due  .  FOOT EXAM  06/06/2019 (Originally 07/15/1951)  . OPHTHALMOLOGY EXAM  06/06/2019 (Originally 07/15/1951)  . URINE MICROALBUMIN  06/06/2019 (Originally 07/15/1951)  . PNA vac Low Risk Adult (2 of 2 - PCV13) 06/06/2019 (Originally 06/22/2015)  . HEMOGLOBIN A1C  10/26/2018  . INFLUENZA VACCINE  Completed     Vitals:   06/04/18 1158  BP: 124/70  Pulse: 75  Resp: 20  Temp: 97.7 F (36.5 C)  TempSrc: Oral  SpO2: 99%  Weight: 246 lb 1.6 oz (111.6 kg)  Height: 5\' 9"  (1.753 m)   Body mass index is 36.34 kg/m.  Physical Exam  GENERAL APPEARANCE: Well nourished. In no acute distress. Obese SKIN:  Skin is warm and dry.  EYES:  left ptosis MOUTH and THROAT: Lips are without lesions. Oral mucosa is moist and without lesions. Tongue is normal in shape, size, and color and  without lesions RESPIRATORY: Breathing is even & unlabored, BS CTAB CARDIAC: RRR, no murmur,no extra heart sounds, LLE 2+ and RLE trace edema, left chest pacemaker GI: Abdomen soft, normal BS, no masses, no tenderness EXTREMITIES:  Able to move X 4 exttremities NEUROLOGICAL: There is no tremor. Speech is clear. Left side weakness. PSYCHIATRIC: Alert and oriented X 3. Affect and behavior are appropriate   Labs reviewed: Recent Labs    05/26/18 1111 05/27/18 0605 05/28/18 0640  NA 137 142 140  K 4.1 3.8 4.0  CL 105 108 107  CO2 26 26 24   GLUCOSE 349* 140* 157*  BUN 14 11 13   CREATININE 0.68 0.85 0.74  CALCIUM 8.7* 8.7* 8.6*   Recent Labs    02/04/18 2235 04/24/18 1756 05/26/18 1111  AST 35 55* 42*  ALT 20 16 20   ALKPHOS 95 73 80  BILITOT 1.9* 3.3* 2.1*  PROT 7.0 6.8 5.8*  ALBUMIN 3.8 4.0 3.3*   Recent Labs    04/24/18 1756 05/26/18 1111  05/27/18 0605 05/27/18 1106 05/28/18 0640  WBC 5.3 3.6*  --   --   --  4.0  NEUTROABS  --  2.2  --   --   --   --   HGB 11.6* 9.5*   < > 10.2* 10.0* 10.1*  HCT 34.6* 29.0*   < > 30.9* 30.0* 30.0*  MCV 68.8* 68.5*  --   --   --  67.5*  PLT 105* 104*  --   --   --  101*   < > = values in this interval not displayed.   Lab Results  Component Value Date   TSH 3.982 02/04/2018   Lab Results  Component Value Date   HGBA1C 9.1 (H) 04/25/2018   Lab Results  Component Value Date   CHOL 152 04/25/2018   HDL 35 (L) 04/25/2018   LDLCALC 96 04/25/2018   TRIG 103 04/25/2018   CHOLHDL 4.3 04/25/2018    Significant Diagnostic Results in last 30 days:  Dg Chest 2 View  Result Date: 05/26/2018 CLINICAL DATA:  Bilateral leg weakness EXAM: CHEST - 2 VIEW COMPARISON:  04/24/2016 FINDINGS: Cardiac shadow remains enlarged. Pacing device is now seen in satisfactory position. Mild central vascular congestion is noted without interstitial edema. No sizable infiltrate or effusion is noted. No bony abnormality is seen. IMPRESSION: Mild  vascular congestion without interstitial edema. Electronically Signed   By: Inez Catalina M.D.   On: 05/26/2018 11:48   Ct Head Wo Contrast  Result Date: 05/27/2018 CLINICAL DATA:  Generalized weakness.  Possible stroke. EXAM: CT HEAD WITHOUT CONTRAST TECHNIQUE: Contiguous axial  images were obtained from the base of the skull through the vertex without intravenous contrast. COMPARISON:  CT scan of May 26, 2018. FINDINGS: Brain: Mild diffuse cortical atrophy is noted. Mild chronic ischemic white matter disease is noted. No mass effect or midline shift is noted. Ventricular size is within normal limits. There is no evidence of mass lesion, hemorrhage or acute infarction. Vascular: No hyperdense vessel or unexpected calcification. Skull: Normal. Negative for fracture or focal lesion. Sinuses/Orbits: Stable mucosal thickening and opacification of right maxillary and ethmoid sinuses. Other: None. IMPRESSION: Mild diffuse cortical atrophy. Mild chronic ischemic white matter disease. No acute intracranial abnormality seen. Electronically Signed   By: Marijo Conception, M.D.   On: 05/27/2018 09:54   Ct Head Wo Contrast  Result Date: 05/26/2018 CLINICAL DATA:  Leg weakness with history of remote stroke EXAM: CT HEAD WITHOUT CONTRAST TECHNIQUE: Contiguous axial images were obtained from the base of the skull through the vertex without intravenous contrast. COMPARISON:  04/24/2018 FINDINGS: Brain: Chronic atrophic and ischemic changes are identified. The previously seen hypodensity within the right periventricular white matter is again identified consistent with prior lacunar infarct. Scattered lacunar infarcts are noted within the basal ganglia bilaterally similar to that seen on the prior exam. No findings to suggest acute hemorrhage, acute infarction or space-occupying mass lesion are noted. Vascular: No hyperdense vessel or unexpected calcification. Skull: Normal. Negative for fracture or focal lesion.  Sinuses/Orbits: Orbits are within normal limits. The paranasal sinuses demonstrate opacification of the right maxillary antrum. Other: None IMPRESSION: Chronic atrophic and ischemic changes. No acute infarct or hemorrhage is seen. Chronic appearing changes in the right maxillary antrum. Electronically Signed   By: Inez Catalina M.D.   On: 05/26/2018 11:47    Assessment/Plan  1. Type II diabetes mellitus with manifestations, uncontrolled (HCC) -will discontinue Humulin R U-500, will start Novolog sliding scale TID and HS, continue Lantus 100 units/ml inject 15 units SQ Q HS Lab Results  Component Value Date   HGBA1C 9.1 (H) 04/25/2018    2. Bipolar 1 disorder (HCC) -  Mood is stable, continue Latuda 60 mg 1 tab daily, Lamotrigine 25 mg 2 tabs = 50 mg BID, Alprazolam 0.5 mg 1 tab Q HS PRN   3. History of CVA (cerebrovascular accident) - stable, continue ASA 325 mg daily    Family/ staff Communication: Discussed plan of care with patient.  Labs/tests ordered:  None  Goals of care:  Short-term rehabilitation.   Durenda Age, NP [facility] [phone] (Monday-Friday 8:00 a.m. - 5:00 p.m.) [phone] (after hours)

## 2018-06-09 ENCOUNTER — Ambulatory Visit: Payer: Medicare Other

## 2018-06-10 ENCOUNTER — Encounter: Payer: Self-pay | Admitting: Adult Health

## 2018-06-10 ENCOUNTER — Other Ambulatory Visit: Payer: Self-pay

## 2018-06-10 ENCOUNTER — Non-Acute Institutional Stay (SKILLED_NURSING_FACILITY): Payer: Medicare Other | Admitting: Adult Health

## 2018-06-10 DIAGNOSIS — E1165 Type 2 diabetes mellitus with hyperglycemia: Secondary | ICD-10-CM

## 2018-06-10 DIAGNOSIS — E785 Hyperlipidemia, unspecified: Secondary | ICD-10-CM

## 2018-06-10 DIAGNOSIS — IMO0002 Reserved for concepts with insufficient information to code with codable children: Secondary | ICD-10-CM

## 2018-06-10 DIAGNOSIS — E118 Type 2 diabetes mellitus with unspecified complications: Secondary | ICD-10-CM

## 2018-06-10 DIAGNOSIS — F0280 Dementia in other diseases classified elsewhere without behavioral disturbance: Secondary | ICD-10-CM

## 2018-06-10 DIAGNOSIS — G309 Alzheimer's disease, unspecified: Secondary | ICD-10-CM

## 2018-06-10 DIAGNOSIS — E1169 Type 2 diabetes mellitus with other specified complication: Secondary | ICD-10-CM | POA: Diagnosis not present

## 2018-06-10 DIAGNOSIS — F0150 Vascular dementia without behavioral disturbance: Secondary | ICD-10-CM

## 2018-06-10 MED ORDER — AMPHETAMINE-DEXTROAMPHETAMINE 20 MG PO TABS: 20.0000 mg | ORAL_TABLET | Freq: Every day | ORAL | 0 refills | Status: DC

## 2018-06-10 MED ORDER — ALPRAZOLAM 0.5 MG PO TABS: 0.5000 mg | ORAL_TABLET | Freq: Every evening | ORAL | 0 refills | Status: DC | PRN

## 2018-06-10 NOTE — Telephone Encounter (Signed)
Discharged with patient 

## 2018-06-10 NOTE — Progress Notes (Signed)
[facility] Room Number: [place] of Service:  SNF (31)    CODE STATUS: DNR  Allergies  Allergen Reactions  . Lisinopril Other (See Comments)    REACTION: Cough  . Penicillins Other (See Comments)    Reaction: unknown Has patient had a PCN reaction causing immediate rash, facial/tongue/throat swelling, SOB or lightheadedness with hypotension: Unknown Has patient had a PCN reaction causing severe rash involving mucus membranes or skin necrosis: Unknown Has patient had a PCN reaction that required hospitalization: Unknown Has patient had a PCN reaction occurring within the last 10 years: Unknown If all of the above answers are "NO", then may proceed with Cephalosporin use.   . Clindamycin/Lincomycin Rash and Other (See Comments)    "turns me purple" "turns me orange"    Chief Complaint  Patient presents with  . Discharge Note    Discharging on 06/11/18    HPI:  He is being discharged; either to home or assisted living. He will need home health for pt/ot/rn/cna/sw. He will need his prescriptions written and will need to follow up with his medical provider. He is unable to live at home alone. He will not need any dme.    Past Medical History:  Diagnosis Date  . Anemia   . Bipolar 1 disorder ([place])   . Carotid artery occlusion   . Dementia ([employer])   . Diabetes mellitus without complication ([place])   . Diabetic peripheral neuropathy ([place]) 03/03/2018  . Gait abnormality 02/12/2017  . Left-sided Bell's palsy 03/03/2018  . Memory disorder 02/12/2017  . TIA (transient ischemic attack)     Past Surgical History:  Procedure Laterality Date  . COLONOSCOPY WITH PROPOFOL N/A 02/25/2017   Procedure: COLONOSCOPY WITH PROPOFOL;  Surgeon: Manya Silvas, MD;  Location: [facility] ENDOSCOPY;  Service: Endoscopy;  Laterality: N/A;  . ESOPHAGOGASTRODUODENOSCOPY (EGD) WITH PROPOFOL N/A 02/25/2017   Procedure: ESOPHAGOGASTRODUODENOSCOPY (EGD) WITH PROPOFOL;   Surgeon: Manya Silvas, MD;  Location: [facility] ENDOSCOPY;  Service: Endoscopy;  Laterality: N/A;  . NASAL SINUS SURGERY    . PACEMAKER PLACEMENT    . TONSILLECTOMY    . TONSILLECTOMY      Social History   Socioeconomic History  . Marital status: Widowed    Spouse name: Not on file  . Number of children: 2  . Years of education: 24  . Highest education level: Not on file  Occupational History  . Occupation: Retired  Scientific laboratory technician  . Financial resource strain: Not on file  . Food insecurity:    Worry: Never true    Inability: Never true  . Transportation needs:    Medical: No    Non-medical: No  Tobacco Use  . Smoking status: Former Smoker    Packs/day: 1.00    Types: Cigarettes, Cigars  . Smokeless tobacco: Never Used  Substance and Sexual Activity  . Alcohol use: Yes    Comment: occasional  . Drug use: No  . Sexual activity: Not Currently  Lifestyle  . Physical activity:    Days per week: 1 day    Minutes per session: 10 min  . Stress: Not at all  Relationships  . Social connections:    Talks on phone: Patient refused    Gets together: Patient refused    Attends religious service: Patient refused    Active member of club or organization: Patient refused    Attends meetings of clubs or organizations: Patient refused    Relationship status:  Patient refused  . Intimate partner violence:    Fear of current or ex partner: Patient refused    Emotionally abused: Patient refused    Physically abused: Patient refused    Forced sexual activity: Patient refused  Other Topics Concern  . Not on file  Social History Narrative   Lives with [name]   Caffeine use: caffeine free soda daily   Right handed   Family History  Problem Relation Age of Onset  . Allergies Mother   . Diabetes Father   . Diabetes Sister     VITAL SIGNS BP (!) 142/51   Pulse 61   Temp 97.6 F (36.4 C)   Resp 20   Ht 5\' 9"  (1.753 m)   Wt 258 lb 9.6 oz (117.3 kg)   SpO2 99%    BMI 38.19 kg/m   Patient's Medications  New Prescriptions   No medications on file  Previous Medications   ALLOPURINOL (ZYLOPRIM) 300 MG TABLET    Take 1 tablet (300 mg total) by mouth daily as needed (gout).   ALPRAZOLAM (XANAX) 0.5 MG TABLET    Take 1 tablet (0.5 mg total) by mouth at bedtime as needed for anxiety.   AMBULATORY NON FORMULARY MEDICATION    BiPAP machine @ 15/12 DX: OSA DX Code:   AMPHETAMINE-DEXTROAMPHETAMINE (ADDERALL) 20 MG TABLET    Take 1 tablet (20 mg total) by mouth daily.   ASPIRIN 325 MG TABLET    Take 325 mg by mouth daily.   CONTINUOUS BLOOD GLUC SENSOR (DEXCOM G6 SENSOR) MISC    Use 1 each every 30 (thirty) days   CONTINUOUS BLOOD GLUC TRANSMIT (DEXCOM G6 TRANSMITTER) MISC    Use 1 Device 4 (four) times daily   DICLOFENAC SODIUM (VOLTAREN) 1 % GEL    Apply 4 g topically daily. Apply to right hip for pain   FREESTYLE LITE TEST STRIP    1 each by Other route as directed.   GABAPENTIN (NEURONTIN) 100 MG CAPSULE    Take 2 capsules (200 mg total) by mouth 3 (three) times daily.   INSULIN ASPART (NOVOLOG) 100 UNIT/ML INJECTION    Inject 30 Units into the skin 3 (three) times daily before meals.    INSULIN GLARGINE (LANTUS) 100 UNIT/ML INJECTION    Inject 20 Units into the skin at bedtime.    LACTULOSE (CHRONULAC) 10 GM/15ML SOLUTION    Take 15 mLs (10 g total) by mouth daily.   LAMOTRIGINE (LAMICTAL) 25 MG TABLET    Take 2 tablets (50 mg total) by mouth 2 (two) times daily.   LIDOCAINE (LIDODERM) 5 %    Place 1 patch onto the skin daily. Remove & Discard patch within 12 hours or as directed by MD   LIVER OIL-ZINC OXIDE (DESITIN) 40 % OINTMENT    Apply topically 3 (three) times daily as needed for irritation.   LURASIDONE HCL (LATUDA) 60 MG TABS    Take 1 tablet (60 mg total) by mouth daily.   MELOXICAM (MOBIC) 15 MG TABLET    Take 1 tablet (15 mg total) by mouth daily.   NON FORMULARY    Diet Type: NCS   NYSTATIN CREAM (MYCOSTATIN)    Apply topically 2 (two) times  daily.   OMEPRAZOLE (PRILOSEC) 40 MG CAPSULE    Take 1 capsule (40 mg total) by mouth daily.   TAMSULOSIN (FLOMAX) 0.4 MG CAPS CAPSULE    Take 0.4 mg by mouth 2 (two) times daily.  Modified Medications  No medications on file  Discontinued Medications   INSULIN ASPART (NOVOLOG) 100 UNIT/ML INJECTION    Inject 2-5 Units into the skin at bedtime. SSI:  201-250 = 2 units, 251-300 = 3 units, 301-350 = 4 units, 351-400 = 5 units, >400 call provider   TAMSULOSIN (FLOMAX) 0.4 MG CAPS CAPSULE    Take 2 capsules (0.8 mg total) by mouth daily.     SIGNIFICANT DIAGNOSTIC EXAMS   LABS REVIEWED; PREVIOUS:   04-25-18: hgb a1c 9.1; chol 152; ldl 96; trig 103; hdl 35   NO NEW LABS.   Review of Systems  Constitutional: Negative for malaise/fatigue.  Respiratory: Negative for cough and shortness of breath.   Cardiovascular: Negative for chest pain, palpitations and leg swelling.  Gastrointestinal: Negative for abdominal pain, constipation and heartburn.  Musculoskeletal: Negative for back pain, joint pain and myalgias.  Skin: Negative.   Neurological: Negative for dizziness.  Psychiatric/Behavioral: The patient is not nervous/anxious.       Physical Exam  Constitutional: He appears well-developed and well-nourished. No distress.  Obese   Neck: No thyromegaly present.  Cardiovascular: Normal rate, regular rhythm, normal heart sounds and intact distal pulses.  Pulmonary/Chest: Effort normal and breath sounds normal. No respiratory distress.  Abdominal: Soft. Bowel sounds are normal. He exhibits no distension. There is no tenderness.  Musculoskeletal: Normal range of motion. He exhibits no edema.  Lymphadenopathy:    He has no cervical adenopathy.  Neurological: He is alert.  Skin: Skin is warm and dry. He is not diaphoretic.  Psychiatric: He has a normal mood and affect.    ASSESSMENT/ PLAN:  Patient is being discharged with the following home health services:  Pt/ot/rn/cna/sw: to  evaluate and treat as indicated for gait balance; strength; adl training; medication management; adl care; community resources   Patient is being discharged with the following durable medical equipment:    Patient has been advised to f/u with their PCP in 1-2 weeks to bring them up to date on their rehab stay.  Social services at facility was responsible for arranging this appointment.  Pt was provided with a 30 day supply of prescriptions for medications and refills must be obtained from their PCP.  For controlled substances, a more limited supply may be provided adequate until PCP appointment only.  His cbgs remain elevated will increase lantus to 25 units nightly   A 30 day supply of his prescription medications as listed above #10 xanax 0.5 mg tabs #15 adderall   Time spent with patient: 40 minutes: discussed medications; home health needs and expectations. Verbalized understanding.    Ok Edwards NP [facility]  Contact [phone] Monday through Friday 8am- 5pm  After hours call [phone]

## 2018-06-12 ENCOUNTER — Other Ambulatory Visit
Admission: RE | Admit: 2018-06-12 | Discharge: 2018-06-12 | Disposition: A | Discharge status: Home / Self Care | Payer: Medicare Other | Source: Ambulatory Visit | Attending: Internal Medicine | Admitting: Internal Medicine

## 2018-06-12 DIAGNOSIS — R410 Disorientation, unspecified: Secondary | ICD-10-CM | POA: Diagnosis present

## 2018-06-12 LAB — AMMONIA: AMMONIA: 58 umol/L — AB (ref 9–35)

## 2018-06-12 LAB — CO2, TOTAL: CO2: 26 mmol/L (ref 22–32)

## 2018-06-16 ENCOUNTER — Other Ambulatory Visit
Admission: RE | Admit: 2018-06-16 | Discharge: 2018-06-16 | Disposition: A | Discharge status: Home / Self Care | Payer: Medicare Other | Source: Ambulatory Visit | Attending: Internal Medicine | Admitting: Internal Medicine

## 2018-06-16 ENCOUNTER — Other Ambulatory Visit: Payer: Self-pay

## 2018-06-16 ENCOUNTER — Encounter: Payer: Self-pay | Admitting: Adult Health

## 2018-06-16 ENCOUNTER — Non-Acute Institutional Stay (SKILLED_NURSING_FACILITY): Payer: Medicare Other | Admitting: Adult Health

## 2018-06-16 DIAGNOSIS — R400 Somnolence: Secondary | ICD-10-CM

## 2018-06-16 DIAGNOSIS — G309 Alzheimer's disease, unspecified: Secondary | ICD-10-CM | POA: Diagnosis present

## 2018-06-16 DIAGNOSIS — E7220 Disorder of urea cycle metabolism, unspecified: Secondary | ICD-10-CM

## 2018-06-16 LAB — COMPREHENSIVE METABOLIC PANEL
ALBUMIN: 3.6 g/dL (ref 3.5–5.0)
ALT: 19 U/L (ref 0–44)
AST: 29 U/L (ref 15–41)
Alkaline Phosphatase: 80 U/L (ref 38–126)
Anion gap: 9 (ref 5–15)
BILIRUBIN TOTAL: 2.5 mg/dL — AB (ref 0.3–1.2)
BUN: 14 mg/dL (ref 8–23)
CALCIUM: 9.2 mg/dL (ref 8.9–10.3)
CO2: 28 mmol/L (ref 22–32)
CREATININE: 0.91 mg/dL (ref 0.61–1.24)
Chloride: 99 mmol/L (ref 98–111)
GFR calc Af Amer: >60 mL/min (ref >60)
GFR calc non Af Amer: >60 mL/min (ref >60)
GLUCOSE: 166 mg/dL — AB (ref 70–99)
POTASSIUM: 4 mmol/L (ref 3.5–5.1)
Sodium: 136 mmol/L (ref 135–145)
TOTAL PROTEIN: 6.8 g/dL (ref 6.5–8.1)

## 2018-06-16 LAB — AMMONIA
Ammonia: 108 umol/L — ABNORMAL HIGH (ref 9–35)
Ammonia: 36 umol/L — ABNORMAL HIGH (ref 9–35)

## 2018-06-16 MED ORDER — AMPHETAMINE-DEXTROAMPHETAMINE 10 MG PO TABS: 10.0000 mg | ORAL_TABLET | Freq: Every day | ORAL | 0 refills | Status: DC

## 2018-06-16 NOTE — Progress Notes (Signed)
Location:   [facility] Room Number: [place] of Service:  SNF (31)   CODE STATUS: DNR  Allergies  Allergen Reactions  . Lisinopril Other (See Comments)    REACTION: Cough  . Penicillins Other (See Comments)    Reaction: unknown Has patient had a PCN reaction causing immediate rash, facial/tongue/throat swelling, SOB or lightheadedness with hypotension: Unknown Has patient had a PCN reaction causing severe rash involving mucus membranes or skin necrosis: Unknown Has patient had a PCN reaction that required hospitalization: Unknown Has patient had a PCN reaction occurring within the last 10 years: Unknown If all of the above answers are "NO", then may proceed with Cephalosporin use.   . Clindamycin/Lincomycin Rash and Other (See Comments)    "turns me purple" "turns me orange"    Chief Complaint  Patient presents with  . Acute Visit    Lab Work    HPI:  His family has been concerned about his level of alertness. His family is worried about his elevated ammonia level. Is was 58; repeated this am at 36. There are no nursing reports of any changes in mentation. There are no reports of any changes in appetite. He is having less than 4 bowel movements per day. His family would like for his adderall to be stopped. I did explain to his family that weaning off is the safest way of getting him off this medication. They have agreed.    Past Medical History:  Diagnosis Date  . Anemia   . Bipolar 1 disorder ([place])   . Carotid artery occlusion   . Dementia ([place])   . Diabetes mellitus without complication ([place])   . Diabetic peripheral neuropathy ([place]) 03/03/2018  . Gait abnormality 02/12/2017  . Left-sided Bell's palsy 03/03/2018  . Memory disorder 02/12/2017  . TIA (transient ischemic attack)     Past Surgical History:  Procedure Laterality Date  . COLONOSCOPY WITH PROPOFOL N/A 02/25/2017   Procedure: COLONOSCOPY WITH PROPOFOL;  Surgeon: Manya Silvas,  MD;  Location: [facility] ENDOSCOPY;  Service: Endoscopy;  Laterality: N/A;  . ESOPHAGOGASTRODUODENOSCOPY (EGD) WITH PROPOFOL N/A 02/25/2017   Procedure: ESOPHAGOGASTRODUODENOSCOPY (EGD) WITH PROPOFOL;  Surgeon: Manya Silvas, MD;  Location: [facility] ENDOSCOPY;  Service: Endoscopy;  Laterality: N/A;  . NASAL SINUS SURGERY    . PACEMAKER PLACEMENT    . TONSILLECTOMY    . TONSILLECTOMY      Social History   Socioeconomic History  . Marital status: Widowed    Spouse name: Not on file  . Number of children: 2  . Years of education: 43  . Highest education level: Not on file  Occupational History  . Occupation: Retired  Scientific laboratory technician  . Financial resource strain: Not on file  . Food insecurity:    Worry: Never true    Inability: Never true  . Transportation needs:    Medical: No    Non-medical: No  Tobacco Use  . Smoking status: Former Smoker    Packs/day: 1.00    Types: Cigarettes, Cigars  . Smokeless tobacco: Never Used  Substance and Sexual Activity  . Alcohol use: Yes    Comment: occasional  . Drug use: No  . Sexual activity: Not Currently  Lifestyle  . Physical activity:    Days per week: 1 day    Minutes per session: 10 min  . Stress: Not at all  Relationships  . Social connections:    Talks on phone: Patient refused  Gets together: Patient refused    Attends religious service: Patient refused    Active member of club or organization: Patient refused    Attends meetings of clubs or organizations: Patient refused    Relationship status: Patient refused  . Intimate partner violence:    Fear of current or ex partner: Patient refused    Emotionally abused: Patient refused    Physically abused: Patient refused    Forced sexual activity: Patient refused  Other Topics Concern  . Not on file  Social History Narrative   Lives with [name]   Caffeine use: caffeine free soda daily   Right handed   Family History  Problem Relation Age of Onset  . Allergies  Mother   . Diabetes Father   . Diabetes Sister       VITAL SIGNS BP (!) 154/68   Pulse 92   Temp 98.6 F (37 C)   Resp 18   Ht 5\' 9"  (1.753 m)   Wt 253 lb 1.6 oz (114.8 kg)   SpO2 96%   BMI 37.38 kg/m   Outpatient Encounter Medications as of 06/16/2018  Medication Sig  . allopurinol (ZYLOPRIM) 300 MG tablet Take 1 tablet (300 mg total) by mouth daily as needed (gout).  . AMBULATORY NON FORMULARY MEDICATION BiPAP machine @ 15/12 DX: OSA DX Code:  . amphetamine-dextroamphetamine (ADDERALL) 20 MG tablet Take 1 tablet (20 mg total) by mouth daily.  Marland Kitchen aspirin 325 MG tablet Take 325 mg by mouth daily.  . Continuous Blood Gluc Sensor (DEXCOM G6 SENSOR) MISC Use 1 each every 30 (thirty) days  . Continuous Blood Gluc Transmit (DEXCOM G6 TRANSMITTER) MISC Use 1 Device 4 (four) times daily  . diclofenac sodium (VOLTAREN) 1 % GEL Apply 4 g topically daily. Apply to right hip for pain  . FREESTYLE LITE test strip 1 each by Other route as directed.  . gabapentin (NEURONTIN) 100 MG capsule Take 2 capsules (200 mg total) by mouth 3 (three) times daily.  . insulin aspart (NOVOLOG) 100 UNIT/ML injection Inject 30 Units into the skin 3 (three) times daily before meals.   . insulin glargine (LANTUS) 100 UNIT/ML injection Inject 30 Units into the skin at bedtime.   Marland Kitchen lactulose (CHRONULAC) 10 GM/15ML solution Take 15 mLs (10 g total) by mouth daily.  Marland Kitchen lamoTRIgine (LAMICTAL) 25 MG tablet Take 2 tablets (50 mg total) by mouth 2 (two) times daily.  Marland Kitchen lidocaine (LIDODERM) 5 % Place 1 patch onto the skin daily. Remove & Discard patch within 12 hours or as directed by MD  . liver oil-zinc oxide (DESITIN) 40 % ointment Apply topically 3 (three) times daily as needed for irritation.  . Lurasidone HCl (LATUDA) 60 MG TABS Take 1 tablet (60 mg total) by mouth daily.  . meloxicam (MOBIC) 15 MG tablet Take 1 tablet (15 mg total) by mouth daily.  . NON FORMULARY Diet Type: NCS  . nystatin cream (MYCOSTATIN)  Apply topically 2 (two) times daily.  Marland Kitchen omeprazole (PRILOSEC) 40 MG capsule Take 1 capsule (40 mg total) by mouth daily.  . tamsulosin (FLOMAX) 0.4 MG CAPS capsule Take 0.4 mg by mouth 2 (two) times daily.  . [DISCONTINUED] ALPRAZolam (XANAX) 0.5 MG tablet Take 1 tablet (0.5 mg total) by mouth at bedtime as needed for anxiety. (Patient not taking: Reported on 06/16/2018)   No facility-administered encounter medications on file as of 06/16/2018.      SIGNIFICANT DIAGNOSTIC EXAMS   LABS REVIEWED; PREVIOUS:   04-25-18:  hgb a1c 9.1; chol 152; ldl 96; trig 103; hdl 35   TODAY ;  06-12-18: ammonia:  58 06-16-18: glucose 166; bun 14; creat 0.91; k+ 4.0; na++ 136; ca 9.2; total bili: 2.5  albumin 3.6 ammonia 108  Repeated ammonia 36   Review of Systems  Constitutional: Negative for malaise/fatigue.  Respiratory: Negative for cough and shortness of breath.   Cardiovascular: Negative for chest pain, palpitations and leg swelling.  Gastrointestinal: Negative for abdominal pain, constipation and heartburn.  Musculoskeletal: Negative for back pain, joint pain and myalgias.  Skin: Negative.   Neurological: Negative for dizziness.  Psychiatric/Behavioral: The patient is not nervous/anxious.     Physical Exam  Constitutional: He appears well-developed and well-nourished. No distress.  obese  Neck: No thyromegaly present.  Cardiovascular: Normal rate, regular rhythm, normal heart sounds and intact distal pulses.  Pulmonary/Chest: Effort normal and breath sounds normal. No respiratory distress.  Abdominal: Soft. Bowel sounds are normal. He exhibits no distension. There is no tenderness.  Musculoskeletal: Normal range of motion. He exhibits no edema.  Lymphadenopathy:    He has no cervical adenopathy.  Neurological: He is alert.  Skin: Skin is warm and dry. He is not diaphoretic.  Psychiatric: He has a normal mood and affect.      ASSESSMENT/ PLAN:  TODAY:   1. Somnolence 2.  Hyperammoniemia  Will continue his current lactulose dose of 15 cc daily  Will lower his adderall to 10 mg daily for 5 days then stop.    MD is aware of resident's narcotic use and is in agreement with current plan of care. We will attempt to wean resident as apropriate   Ok Edwards NP [facility] Adult [facility]  Contact [phone] Monday through Friday 8am- 5pm  After hours call [phone]

## 2018-06-16 NOTE — Telephone Encounter (Signed)
Rx sent to [facility] phone : [phone] , fax : [phone]  

## 2018-06-17 ENCOUNTER — Telehealth: Payer: Self-pay | Admitting: Urology

## 2018-06-17 NOTE — Telephone Encounter (Signed)
Patient needs Rx for nystatin/triamcinolone sent to the [facility] pharmacy.  Patient was seen on 06/02/18 and was given a hand written Rx.  He needs the Rx sent to the pharmacy as soon as possible.   Patient can be reached at [phone].

## 2018-06-18 MED ORDER — NYSTATIN-TRIAMCINOLONE 100000-0.1 UNIT/GM-% EX CREA: 1.0000 "application " | TOPICAL_CREAM | Freq: Two times a day (BID) | CUTANEOUS | 0 refills | Status: DC

## 2018-06-18 NOTE — Telephone Encounter (Signed)
Cream was sent to pharmacy.

## 2018-06-21 ENCOUNTER — Other Ambulatory Visit
Admission: RE | Admit: 2018-06-21 | Discharge: 2018-06-21 | Disposition: A | Discharge status: Home / Self Care | Payer: Medicare Other | Source: Skilled Nursing Facility | Attending: Adult Health | Admitting: Adult Health

## 2018-06-21 DIAGNOSIS — F3177 Bipolar disorder, in partial remission, most recent episode mixed: Secondary | ICD-10-CM | POA: Insufficient documentation

## 2018-06-21 LAB — AMMONIA: AMMONIA: 111 umol/L — AB (ref 9–35)

## 2018-06-24 ENCOUNTER — Encounter: Payer: Self-pay | Admitting: Podiatry

## 2018-06-24 ENCOUNTER — Ambulatory Visit (INDEPENDENT_AMBULATORY_CARE_PROVIDER_SITE_OTHER): Payer: Medicare Other | Admitting: Podiatry

## 2018-06-24 VITALS — BP 152/69 | HR 95

## 2018-06-24 DIAGNOSIS — M79676 Pain in unspecified toe(s): Secondary | ICD-10-CM | POA: Diagnosis not present

## 2018-06-24 DIAGNOSIS — B351 Tinea unguium: Secondary | ICD-10-CM

## 2018-06-24 DIAGNOSIS — E1142 Type 2 diabetes mellitus with diabetic polyneuropathy: Secondary | ICD-10-CM

## 2018-06-28 DIAGNOSIS — E7220 Disorder of urea cycle metabolism, unspecified: Secondary | ICD-10-CM | POA: Insufficient documentation

## 2018-07-01 ENCOUNTER — Other Ambulatory Visit
Admission: RE | Admit: 2018-07-01 | Discharge: 2018-07-01 | Disposition: A | Discharge status: Home / Self Care | Payer: Medicare Other | Source: Ambulatory Visit | Attending: "Endocrinology | Admitting: "Endocrinology

## 2018-07-01 DIAGNOSIS — Z794 Long term (current) use of insulin: Secondary | ICD-10-CM | POA: Diagnosis present

## 2018-07-01 DIAGNOSIS — E1165 Type 2 diabetes mellitus with hyperglycemia: Secondary | ICD-10-CM | POA: Insufficient documentation

## 2018-07-01 LAB — AMMONIA: AMMONIA: 45 umol/L — AB (ref 9–35)

## 2018-07-01 NOTE — Progress Notes (Signed)
Subjective:  Patient ID: [name] Day, male    DOB: Aug 03, 1941,  MRN: [id number]  Chief Complaint  Patient presents with  . Nail Problem    right foot 3rd toenail; pt stated, "always jamming it, sore around nail bed; been about 2wks; type 2 diabetic (sugar-250 A1c-7.9 2wks)"    77 y.o. male presents  for diabetic foot care. Last AMBS was 250. Reports numbness and tingling in their feet. Denies cramping in legs and thighs.  Review of Systems: Negative except as noted in the HPI. Denies N/V/F/Ch.  Past Medical History:  Diagnosis Date  . Anemia   . Bipolar 1 disorder ([place])   . Carotid artery occlusion   . Dementia ([place])   . Diabetes mellitus without complication ([place])   . Diabetic peripheral neuropathy ([place]) 03/03/2018  . Gait abnormality 02/12/2017  . Left-sided Bell's palsy 03/03/2018  . Memory disorder 02/12/2017  . TIA (transient ischemic attack)     Current Outpatient Medications:  .  allopurinol (ZYLOPRIM) 300 MG tablet, Take 1 tablet (300 mg total) by mouth daily as needed (gout)., Disp: 30 tablet, Rfl: 0 .  AMBULATORY NON FORMULARY MEDICATION, BiPAP machine @ 15/12 DX: OSA DX Code:, Disp: 1 each, Rfl: 0 .  amphetamine-dextroamphetamine (ADDERALL) 10 MG tablet, Take 1 tablet (10 mg total) by mouth daily., Disp: 5 tablet, Rfl: 0 .  aspirin 325 MG tablet, Take 325 mg by mouth daily., Disp: , Rfl:  .  Continuous Blood Gluc Sensor (DEXCOM G6 SENSOR) MISC, Use 1 each every 30 (thirty) days, Disp: , Rfl:  .  Continuous Blood Gluc Transmit (DEXCOM G6 TRANSMITTER) MISC, Use 1 Device 4 (four) times daily, Disp: , Rfl:  .  diclofenac sodium (VOLTAREN) 1 % GEL, Apply 4 g topically daily. Apply to right hip for pain, Disp: , Rfl:  .  FREESTYLE LITE test strip, 1 each by Other route as directed., Disp: 100 each, Rfl: 0 .  gabapentin (NEURONTIN) 100 MG capsule, Take 2 capsules (200 mg total) by mouth 3 (three) times daily., Disp: 180 capsule, Rfl: 0 .  insulin aspart (NOVOLOG) 100  UNIT/ML injection, Inject 30 Units into the skin 3 (three) times daily before meals. , Disp: , Rfl:  .  insulin glargine (LANTUS) 100 UNIT/ML injection, Inject 30 Units into the skin at bedtime. , Disp: , Rfl:  .  lactulose (CHRONULAC) 10 GM/15ML solution, Take 15 mLs (10 g total) by mouth daily., Disp: 240 mL, Rfl: 0 .  lamoTRIgine (LAMICTAL) 25 MG tablet, Take 2 tablets (50 mg total) by mouth 2 (two) times daily., Disp: 180 tablet, Rfl: 0 .  lidocaine (LIDODERM) 5 %, Place 1 patch onto the skin daily. Remove & Discard patch within 12 hours or as directed by MD, Disp: 30 patch, Rfl: 0 .  liver oil-zinc oxide (DESITIN) 40 % ointment, Apply topically 3 (three) times daily as needed for irritation., Disp: 56.7 g, Rfl: 0 .  Lurasidone HCl (LATUDA) 60 MG TABS, Take 1 tablet (60 mg total) by mouth daily., Disp: 30 tablet, Rfl: 0 .  meloxicam (MOBIC) 15 MG tablet, Take 1 tablet (15 mg total) by mouth daily., Disp: 30 tablet, Rfl: 0 .  NON FORMULARY, Diet Type: NCS, Disp: , Rfl:  .  nystatin cream (MYCOSTATIN), Apply topically 2 (two) times daily., Disp: 30 g, Rfl: 0 .  nystatin-triamcinolone (MYCOLOG II) cream, Apply 1 application topically 2 (two) times daily., Disp: 30 g, Rfl: 0 .  omeprazole (PRILOSEC) 40 MG capsule, Take 1 capsule (  40 mg total) by mouth daily., Disp: 30 capsule, Rfl: 0 .  tamsulosin (FLOMAX) 0.4 MG CAPS capsule, Take 0.4 mg by mouth 2 (two) times daily., Disp: , Rfl:   Social History   Tobacco Use  Smoking Status Former Smoker  . Packs/day: 1.00  . Types: Cigarettes, Cigars  Smokeless Tobacco Never Used    Allergies  Allergen Reactions  . Lisinopril Other (See Comments)    REACTION: Cough  . Penicillins Other (See Comments)    Reaction: unknown Has patient had a PCN reaction causing immediate rash, facial/tongue/throat swelling, SOB or lightheadedness with hypotension: Unknown Has patient had a PCN reaction causing severe rash involving mucus membranes or skin necrosis:  Unknown Has patient had a PCN reaction that required hospitalization: Unknown Has patient had a PCN reaction occurring within the last 10 years: Unknown If all of the above answers are "NO", then may proceed with Cephalosporin use.   . Clindamycin/Lincomycin Rash and Other (See Comments)    "turns me purple" "turns me orange"   Objective:   Vitals:   06/24/18 1106  BP: (!) 152/69  Pulse: 95   There is no height or weight on file to calculate BMI. Constitutional Well developed. Well nourished.  Vascular Dorsalis pedis pulses present 2+ bilaterally  Posterior tibial pulses absent bilaterally  Pedal hair growth diminished. Capillary refill normal to all digits.  No cyanosis or clubbing noted.  Neurologic Normal speech. Oriented to person, place, and time. Epicritic sensation to light touch grossly present bilaterally. Protective sensation with 5.07 monofilament  absent bilaterally. Vibratory sensation present bilaterally.  Dermatologic Nails elongated, thickened, dystrophic. No open wounds. No skin lesions.  Orthopedic: Normal joint ROM without pain or crepitus bilaterally. No visible deformities. No bony tenderness.   Assessment:   1. DM type 2 with diabetic peripheral neuropathy ([place])   2. Onychomycosis    Plan:  Patient was evaluated and treated and all questions answered.  Diabetes with DPN, Onychomycosis -Educated on diabetic footcare. Diabetic risk level 1 -Nails x10 debrided sharply and manually with large nail nipper and rotary burr.   Procedure: Nail Debridement Rationale: Patient meets criteria for routine foot care due to DPN Type of Debridement: manual, sharp debridement. Instrumentation: Nail nipper, rotary burr. Number of Nails: 10  Return in about 3 months (around 09/24/2018) for Diabetic Foot Care.

## 2018-07-06 ENCOUNTER — Other Ambulatory Visit: Payer: Self-pay | Admitting: Internal Medicine

## 2018-07-06 ENCOUNTER — Telehealth: Payer: Self-pay | Admitting: Urology

## 2018-07-06 ENCOUNTER — Encounter: Payer: Self-pay | Admitting: Urology

## 2018-07-06 ENCOUNTER — Ambulatory Visit (INDEPENDENT_AMBULATORY_CARE_PROVIDER_SITE_OTHER): Payer: Medicare Other | Admitting: Urology

## 2018-07-06 VITALS — BP 110/59 | HR 59

## 2018-07-06 DIAGNOSIS — R339 Retention of urine, unspecified: Secondary | ICD-10-CM

## 2018-07-06 DIAGNOSIS — K7682 Hepatic encephalopathy: Secondary | ICD-10-CM

## 2018-07-06 DIAGNOSIS — K7290 Hepatic failure, unspecified without coma: Secondary | ICD-10-CM

## 2018-07-06 DIAGNOSIS — E7220 Disorder of urea cycle metabolism, unspecified: Secondary | ICD-10-CM

## 2018-07-06 LAB — BLADDER SCAN AMB NON-IMAGING

## 2018-07-06 MED ORDER — TAMSULOSIN HCL 0.4 MG PO CAPS: 0.4000 mg | ORAL_CAPSULE | Freq: Two times a day (BID) | ORAL | 0 refills | Status: DC

## 2018-07-06 NOTE — Telephone Encounter (Signed)
Pt's daughter brought pt in for appt today w/[facility].  They wanted to increase his Flomax.  She thought he was taking once per day, but he is taking it twice per day.  [facility] wanted it increased, so she wants to know what they need to increase it to.  Please call Heather [phone]

## 2018-07-06 NOTE — Progress Notes (Signed)
07/06/2018 4:03 PM   [name] Day 04-Oct-1940 [id number]  Referring provider: Idelle Crouch, MD [facility] [place], [place] [address]  Chief Complaint  Patient presents with  . Urinary Retention    HPI: 77 year old male initially seen 06/02/2018 and follow-up after hospitalization.  He had a catheter placed for urinary retention which he was unable to tolerate.  They were going to increase his tamsulosin at his last visit however currently only taking 0.4 mg daily.  He has had hesitancy and decreased force and caliber of his urinary stream.  PVR by bladder scan today for and 50 mL.   PMH: Past Medical History:  Diagnosis Date  . Anemia   . Bipolar 1 disorder ([place])   . Carotid artery occlusion   . Dementia ([place])   . Diabetes mellitus without complication ([place])   . Diabetic peripheral neuropathy ([place]) 03/03/2018  . Gait abnormality 02/12/2017  . Left-sided Bell's palsy 03/03/2018  . Memory disorder 02/12/2017  . TIA (transient ischemic attack)     Surgical History: Past Surgical History:  Procedure Laterality Date  . COLONOSCOPY WITH PROPOFOL N/A 02/25/2017   Procedure: COLONOSCOPY WITH PROPOFOL;  Surgeon: Manya Silvas, MD;  Location: [facility] ENDOSCOPY;  Service: Endoscopy;  Laterality: N/A;  . ESOPHAGOGASTRODUODENOSCOPY (EGD) WITH PROPOFOL N/A 02/25/2017   Procedure: ESOPHAGOGASTRODUODENOSCOPY (EGD) WITH PROPOFOL;  Surgeon: Manya Silvas, MD;  Location: [facility] ENDOSCOPY;  Service: Endoscopy;  Laterality: N/A;  . NASAL SINUS SURGERY    . PACEMAKER PLACEMENT    . TONSILLECTOMY    . TONSILLECTOMY      Home Medications:  Allergies as of 07/06/2018      Reactions   Lisinopril Other (See Comments)   REACTION: Cough   Penicillins Other (See Comments)   Reaction: unknown Has patient had a PCN reaction causing immediate rash, facial/tongue/throat swelling, SOB or lightheadedness with hypotension: Unknown Has patient had a PCN reaction  causing severe rash involving mucus membranes or skin necrosis: Unknown Has patient had a PCN reaction that required hospitalization: Unknown Has patient had a PCN reaction occurring within the last 10 years: Unknown If all of the above answers are "NO", then may proceed with Cephalosporin use.   Clindamycin/lincomycin Rash, Other (See Comments)   "turns me purple" "turns me orange"      Medication List        Accurate as of 07/06/18  4:03 PM. Always use your most recent med list.          allopurinol 300 MG tablet Commonly known as:  ZYLOPRIM Take 1 tablet (300 mg total) by mouth daily as needed (gout).   AMBULATORY NON FORMULARY MEDICATION BiPAP machine @ 15/12 DX: OSA DX Code:   amphetamine-dextroamphetamine 10 MG tablet Commonly known as:  ADDERALL Take 1 tablet (10 mg total) by mouth daily.   aspirin 325 MG tablet Take 325 mg by mouth daily.   DEXCOM G6 SENSOR Misc Use 1 each every 30 (thirty) days   DEXCOM G6 TRANSMITTER Misc Use 1 Device 4 (four) times daily   diclofenac sodium 1 % Gel Commonly known as:  VOLTAREN Apply 4 g topically daily. Apply to right hip for pain   FREESTYLE LITE test strip Generic drug:  glucose blood 1 each by Other route as directed.   gabapentin 100 MG capsule Commonly known as:  NEURONTIN Take 2 capsules (200 mg total) by mouth 3 (three) times daily.   insulin glargine 100 UNIT/ML injection Commonly known as:  LANTUS Inject 30 Units into the skin at bedtime.   lactulose 10 GM/15ML solution Commonly known as:  CHRONULAC Take 15 mLs (10 g total) by mouth daily.   lamoTRIgine 25 MG tablet Commonly known as:  LAMICTAL Take 2 tablets (50 mg total) by mouth 2 (two) times daily.   lidocaine 5 % Commonly known as:  LIDODERM Place 1 patch onto the skin daily. Remove & Discard patch within 12 hours or as directed by MD   liver oil-zinc oxide 40 % ointment Commonly known as:  DESITIN Apply topically 3 (three) times daily as  needed for irritation.   Lurasidone HCl 60 MG Tabs Take 1 tablet (60 mg total) by mouth daily.   meloxicam 15 MG tablet Commonly known as:  MOBIC Take 1 tablet (15 mg total) by mouth daily.   metFORMIN 500 MG 24 hr tablet Commonly known as:  GLUCOPHAGE-XR Take by mouth.   MULTI-VITAMINS Tabs Take by mouth.   NON FORMULARY Diet Type: NCS   NOVOLOG 100 UNIT/ML injection Generic drug:  insulin aspart Inject 30 Units into the skin 3 (three) times daily before meals.   nystatin cream Commonly known as:  MYCOSTATIN Apply topically 2 (two) times daily.   nystatin-triamcinolone cream Commonly known as:  MYCOLOG II Apply 1 application topically 2 (two) times daily.   omeprazole 40 MG capsule Commonly known as:  PRILOSEC Take 1 capsule (40 mg total) by mouth daily.   tamsulosin 0.4 MG Caps capsule Commonly known as:  FLOMAX Take 0.4 mg by mouth 2 (two) times daily.   Vitamin D3 1000 units Caps Take by mouth.   Zinc Gluconate 100 MG Tabs Take by mouth.       Allergies:  Allergies  Allergen Reactions  . Lisinopril Other (See Comments)    REACTION: Cough  . Penicillins Other (See Comments)    Reaction: unknown Has patient had a PCN reaction causing immediate rash, facial/tongue/throat swelling, SOB or lightheadedness with hypotension: Unknown Has patient had a PCN reaction causing severe rash involving mucus membranes or skin necrosis: Unknown Has patient had a PCN reaction that required hospitalization: Unknown Has patient had a PCN reaction occurring within the last 10 years: Unknown If all of the above answers are "NO", then may proceed with Cephalosporin use.   . Clindamycin/Lincomycin Rash and Other (See Comments)    "turns me purple" "turns me orange"    Family History: Family History  Problem Relation Age of Onset  . Allergies Mother   . Diabetes Father   . Diabetes Sister     Social History:  reports that he has quit smoking. His smoking use  included cigarettes and cigars. He smoked 1.00 pack per day. He has never used smokeless tobacco. He reports that he drinks alcohol. He reports that he does not use drugs.  ROS: UROLOGY Frequent Urination?: No Hard to postpone urination?: No Burning/pain with urination?: No Get up at night to urinate?: Yes Leakage of urine?: No Urine stream starts and stops?: No Trouble starting stream?: Yes Do you have to strain to urinate?: Yes Blood in urine?: No Urinary tract infection?: No Sexually transmitted disease?: No Injury to kidneys or bladder?: No Painful intercourse?: No Weak stream?: No Erection problems?: No Penile pain?: No  Gastrointestinal Nausea?: No Vomiting?: No Indigestion/heartburn?: No Diarrhea?: No Constipation?: No  Constitutional Fever: No Night sweats?: No Weight loss?: No Fatigue?: No  Skin Skin rash/lesions?: No Itching?: No  Eyes Blurred vision?: No Double vision?: No  Ears/Nose/Throat Sore  throat?: No Sinus problems?: No  Hematologic/Lymphatic Swollen glands?: No Easy bruising?: No  Cardiovascular Leg swelling?: No Chest pain?: No  Respiratory Cough?: No Shortness of breath?: No  Endocrine Excessive thirst?: No  Musculoskeletal Back pain?: No Joint pain?: No  Neurological Headaches?: No Dizziness?: No  Psychologic Depression?: No Anxiety?: No  Physical Exam: BP (!) 110/59 (BP Location: Left Arm, Patient Position: Sitting, Cuff Size: Large)   Pulse (!) 59   Constitutional:  Alert and oriented, No acute distress. HEENT: [place] AT, moist mucus membranes.  Trachea midline, no masses. Cardiovascular: No clubbing, cyanosis, or edema. Respiratory: Normal respiratory effort, no increased work of breathing. GI: Abdomen is soft, nontender, nondistended, no abdominal masses GU: No CVA tenderness Lymph: No cervical or inguinal lymphadenopathy. Skin: No rashes, bruises or suspicious lesions. Neurologic: Grossly intact, no focal  deficits, moving all 4 extremities. Psychiatric: Normal mood and affect.   Assessment & Plan:   77 year old male with incomplete bladder emptying/urinary retention.  He declined Foley catheter placement and wanted to try and maximize medical therapy.  Will briefly increase his tamsulosin to 0.8 mg daily.  He is scheduled for an abdominal ultrasound on 11/13 and will review for evaluation of bladder emptying and hydronephrosis.   Abbie Sons, [place] [address], [place], [place] [address] [phone]

## 2018-07-08 NOTE — Telephone Encounter (Signed)
Patient is already taking Flomax 2 times daily. Please advise

## 2018-07-09 NOTE — Telephone Encounter (Signed)
Will see what his abdominal ultrasound shows and if not emptying any better would recommend Foley catheter placement.

## 2018-07-11 ENCOUNTER — Encounter: Payer: Self-pay | Admitting: Urology

## 2018-07-13 NOTE — Telephone Encounter (Signed)
Patient's son notified and voiced understanding.  

## 2018-07-14 ENCOUNTER — Other Ambulatory Visit: Payer: Self-pay | Admitting: Internal Medicine

## 2018-07-14 ENCOUNTER — Ambulatory Visit
Admission: RE | Admit: 2018-07-14 | Discharge: 2018-07-14 | Disposition: A | Discharge status: Home / Self Care | Payer: Medicare Other | Source: Ambulatory Visit | Attending: Internal Medicine | Admitting: Internal Medicine

## 2018-07-14 DIAGNOSIS — K766 Portal hypertension: Secondary | ICD-10-CM

## 2018-07-14 DIAGNOSIS — K7682 Hepatic encephalopathy: Secondary | ICD-10-CM

## 2018-07-14 DIAGNOSIS — R161 Splenomegaly, not elsewhere classified: Secondary | ICD-10-CM | POA: Diagnosis not present

## 2018-07-14 DIAGNOSIS — E7220 Disorder of urea cycle metabolism, unspecified: Secondary | ICD-10-CM | POA: Diagnosis not present

## 2018-07-14 DIAGNOSIS — K7290 Hepatic failure, unspecified without coma: Secondary | ICD-10-CM

## 2018-07-14 DIAGNOSIS — N281 Cyst of kidney, acquired: Secondary | ICD-10-CM | POA: Insufficient documentation

## 2018-07-14 DIAGNOSIS — N2889 Other specified disorders of kidney and ureter: Secondary | ICD-10-CM | POA: Insufficient documentation

## 2018-07-19 ENCOUNTER — Ambulatory Visit
Admission: RE | Admit: 2018-07-19 | Discharge: 2018-07-19 | Disposition: A | Discharge status: Home / Self Care | Payer: Medicare Other | Source: Ambulatory Visit | Attending: Internal Medicine | Admitting: Internal Medicine

## 2018-07-19 DIAGNOSIS — K766 Portal hypertension: Secondary | ICD-10-CM | POA: Insufficient documentation

## 2018-07-19 DIAGNOSIS — K7682 Hepatic encephalopathy: Secondary | ICD-10-CM

## 2018-07-19 DIAGNOSIS — K8020 Calculus of gallbladder without cholecystitis without obstruction: Secondary | ICD-10-CM | POA: Insufficient documentation

## 2018-07-19 DIAGNOSIS — K5730 Diverticulosis of large intestine without perforation or abscess without bleeding: Secondary | ICD-10-CM | POA: Diagnosis not present

## 2018-07-19 DIAGNOSIS — N2889 Other specified disorders of kidney and ureter: Secondary | ICD-10-CM | POA: Insufficient documentation

## 2018-07-19 DIAGNOSIS — K7290 Hepatic failure, unspecified without coma: Secondary | ICD-10-CM | POA: Insufficient documentation

## 2018-07-19 HISTORY — DX: Malignant (primary) neoplasm, unspecified: C80.1

## 2018-07-19 HISTORY — DX: Essential (primary) hypertension: I10

## 2018-07-19 MED ORDER — IOPAMIDOL (ISOVUE-300) INJECTION 61%
100.0000 mL | Freq: Once | INTRAVENOUS | Status: AC | PRN
  Administered 2018-07-19: 100 mL via INTRAVENOUS

## 2018-07-20 ENCOUNTER — Encounter: Payer: Self-pay | Admitting: Urology

## 2018-07-20 ENCOUNTER — Ambulatory Visit (INDEPENDENT_AMBULATORY_CARE_PROVIDER_SITE_OTHER): Payer: Medicare Other | Admitting: Urology

## 2018-07-20 ENCOUNTER — Other Ambulatory Visit: Payer: Self-pay

## 2018-07-20 VITALS — Wt 248.0 lb

## 2018-07-20 DIAGNOSIS — N401 Enlarged prostate with lower urinary tract symptoms: Secondary | ICD-10-CM

## 2018-07-20 DIAGNOSIS — N2889 Other specified disorders of kidney and ureter: Secondary | ICD-10-CM

## 2018-07-20 DIAGNOSIS — N138 Other obstructive and reflux uropathy: Secondary | ICD-10-CM | POA: Diagnosis not present

## 2018-07-20 DIAGNOSIS — R339 Retention of urine, unspecified: Secondary | ICD-10-CM

## 2018-07-20 NOTE — Progress Notes (Signed)
07/20/2018 9:31 AM   [name] Day Jan 19, 1941 [id number]  Referring provider: Idelle Crouch, MD [name] Day, [name] Day  CC: 7 cm enhancing right renal mass  HPI: I saw [name] Day in urology clinic today in for discussion of new 7 cm enhancing right renal mass found on CT 07/19/2018.  He was previously followed by my partner [name] Day for elevated PVR intermittently requiring Foley placement.  Patient has since refused catheter placement.  He feels he is voiding "okay", and denies any gross hematuria.  He is on Flomax 0.8 mg nightly.  Renal ultrasound was ordered in early November to evaluate for hydronephrosis.  No hydronephrosis was seen, however there was a large right solid renal mass found that triggered CT scan with contrast. This was performed 07/19/2018 and showed a 7.5 cm upper and mid pole enhancing solid mass of the right kidney with central necrosis worrisome for renal cell carcinoma.  There is no evidence of metastatic disease.  Chest x-ray performed 05/26/2018 did not show any evidence of metastatic disease in the chest.  There is no prior cross-sectional imaging to review.  The patient has numerous co-morbidities including Alzheimer's and vascular dementia, cirrhosis, history of stroke (most recently in August 2019) with residual weakness requiring a walker, bipolar disorder, coronary artery disease, sleep apnea (non-compliant with CPAP), and insulin-dependent diabetes.  He lives with his son and daughter-in-law.  He denies any gross hematuria or flank pain.  He has had 10 pounds of weight loss over the last few months, however this has been intentional. Renal function is normal with creatinine of 0.9, eGFR > 60.  He denies fevers, chills, night sweats, or bone pain.  He denies any prior abdominal surgeries.  His only anticoagulant is 325 mg daily aspirin, however his family reports they have an appointment with cardiology  today to consider starting additional anticoagulation.   PMH: Past Medical History:  Diagnosis Date  . Anemia   . Bipolar 1 disorder ([place])   . Cancer ([place])   . Carotid artery occlusion   . Dementia ([place])   . Diabetes mellitus without complication ([place])   . Diabetic peripheral neuropathy ([place]) 03/03/2018  . Gait abnormality 02/12/2017  . Hypertension   . Left-sided Bell's palsy 03/03/2018  . Memory disorder 02/12/2017  . TIA (transient ischemic attack)     Surgical History: Past Surgical History:  Procedure Laterality Date  . COLONOSCOPY WITH PROPOFOL N/A 02/25/2017   Procedure: COLONOSCOPY WITH PROPOFOL;  Surgeon: Manya Silvas, MD;  Location: [facility] ENDOSCOPY;  Service: Endoscopy;  Laterality: N/A;  . ESOPHAGOGASTRODUODENOSCOPY (EGD) WITH PROPOFOL N/A 02/25/2017   Procedure: ESOPHAGOGASTRODUODENOSCOPY (EGD) WITH PROPOFOL;  Surgeon: Manya Silvas, MD;  Location: [facility] ENDOSCOPY;  Service: Endoscopy;  Laterality: N/A;  . NASAL SINUS SURGERY    . PACEMAKER PLACEMENT    . TONSILLECTOMY    . TONSILLECTOMY     Allergies:  Allergies  Allergen Reactions  . Lisinopril Other (See Comments)    REACTION: Cough  . Penicillins Other (See Comments)    Reaction: unknown Has patient had a PCN reaction causing immediate rash, facial/tongue/throat swelling, SOB or lightheadedness with hypotension: Unknown Has patient had a PCN reaction causing severe rash involving mucus membranes or skin necrosis: Unknown Has patient had a PCN reaction that required hospitalization: Unknown Has patient had a PCN reaction occurring within the last 10 years: Unknown If all of the above answers are "NO", then may proceed with Cephalosporin  use.   . Clindamycin/Lincomycin Rash and Other (See Comments)    "turns me purple" "turns me orange"    Family History: Family History  Problem Relation Age of Onset  . Allergies Mother   . Diabetes Father   . Diabetes Sister     Social History:  reports that he  has quit smoking. His smoking use included cigarettes and cigars. He smoked 1.00 pack per day. He has never used smokeless tobacco. He reports that he drinks alcohol. He reports that he does not use drugs.  ROS: Please see flowsheet from today's date for complete review of systems.  Physical Exam: Wt 248 lb (112.5 kg)   BMI 36.62 kg/m    Constitutional:  Alert and oriented, ambulates with walker Cardiovascular: No clubbing, cyanosis, or edema. Respiratory: Normal respiratory effort, no increased work of breathing. GI: Abdomen is soft, nontender, nondistended, no abdominal masses GU: No CVA tenderness, phallus with mild phimosis and some irritation of the glans, widely patent meatus Lymph: No cervical or inguinal lymphadenopathy. Skin: No rashes, bruises or suspicious lesions. Neurologic: Grossly intact, no focal deficits, moving all 4 extremities. Psychiatric: Normal mood and affect.  Laboratory Data: 06/16/2018 Calcium 9.2  Alk phos 80 Creatinine 0.91, eGFR> 60 Ammonia 108 Bilirubin 2.5 Hematocrit 30 Platelets 101  Pertinent Imaging: I have personally reviewed the CT abdomen pelvis with contrast.  Right mid and upper pole central renal mass with necrotic appearing center.  Worrisome for renal cell carcinoma.  No evidence of metastatic disease on CT or recent chest x-ray.  Assessment & Plan:   In summary, [name] Day is a 77 year old frail appearing male with numerous co-morbidities including cirrhosis, vascular disease, history of stroke, dementia, and insulin-dependent diabetes who presents with incidental finding of an asymptomatic 7.5 cm right central enhancing renal mass worrisome for renal cell carcinoma.  I had a long conversation today with the patient and his family about these findings.  We discussed that without a biopsy, we cannot know 100% what this is, however I would estimate a greater than 95% chance of malignancy, and most likely would be renal cell carcinoma.  I  discussed standard treatment would be radical nephrectomy with ideally a laparoscopic or robotic approach, however his numerous co-morbidities would put him at very high risk for complications.  His estimated life expectancy would certainly be less than 5 to 10 years.  In elderly patients with co-morbidities, renal mass biopsy can be a good option to obtain pathology and potentially predict prognosis and provide further information on the course of his disease if we pursued active surveillance.  We also discussed possibilities of metastatic spread or invasion into the collecting system resulting in hematuria and/or flank pain.  I encouraged them to seek a second opinion at [facility] urology.  I would certainly lean towards biopsy and active surveillance in the setting of his numerous co-morbidities.  Please note 25 minutes were spent with the patient and his family, greater than 50% were spent in direct face-to-face patient education and counseling regarding renal mass, renal cell carcinoma, and treatment options.  Billey Co, [facility] [address], [place], [place] [address] [phone]

## 2018-07-31 ENCOUNTER — Emergency Department
Admission: EM | Admit: 2018-07-31 | Discharge: 2018-08-01 | Disposition: A | Discharge status: Home / Self Care | Payer: Medicare Other | Attending: Student in an Organized Health Care Education/Training Program | Admitting: Student in an Organized Health Care Education/Training Program

## 2018-07-31 ENCOUNTER — Other Ambulatory Visit: Payer: Self-pay

## 2018-07-31 ENCOUNTER — Emergency Department: Payer: Medicare Other

## 2018-07-31 DIAGNOSIS — R4182 Altered mental status, unspecified: Secondary | ICD-10-CM | POA: Diagnosis not present

## 2018-07-31 DIAGNOSIS — F0390 Unspecified dementia without behavioral disturbance: Secondary | ICD-10-CM | POA: Diagnosis not present

## 2018-07-31 DIAGNOSIS — E1140 Type 2 diabetes mellitus with diabetic neuropathy, unspecified: Secondary | ICD-10-CM | POA: Diagnosis not present

## 2018-07-31 DIAGNOSIS — N183 Chronic kidney disease, stage 3 (moderate): Secondary | ICD-10-CM | POA: Diagnosis not present

## 2018-07-31 DIAGNOSIS — Z8673 Personal history of transient ischemic attack (TIA), and cerebral infarction without residual deficits: Secondary | ICD-10-CM | POA: Diagnosis not present

## 2018-07-31 DIAGNOSIS — N390 Urinary tract infection, site not specified: Secondary | ICD-10-CM

## 2018-07-31 DIAGNOSIS — E1122 Type 2 diabetes mellitus with diabetic chronic kidney disease: Secondary | ICD-10-CM | POA: Insufficient documentation

## 2018-07-31 DIAGNOSIS — Z950 Presence of cardiac pacemaker: Secondary | ICD-10-CM | POA: Diagnosis not present

## 2018-07-31 DIAGNOSIS — R2681 Unsteadiness on feet: Secondary | ICD-10-CM | POA: Diagnosis not present

## 2018-07-31 DIAGNOSIS — Z87891 Personal history of nicotine dependence: Secondary | ICD-10-CM | POA: Insufficient documentation

## 2018-07-31 DIAGNOSIS — Z79899 Other long term (current) drug therapy: Secondary | ICD-10-CM | POA: Insufficient documentation

## 2018-07-31 DIAGNOSIS — Z794 Long term (current) use of insulin: Secondary | ICD-10-CM | POA: Diagnosis not present

## 2018-07-31 DIAGNOSIS — I129 Hypertensive chronic kidney disease with stage 1 through stage 4 chronic kidney disease, or unspecified chronic kidney disease: Secondary | ICD-10-CM | POA: Insufficient documentation

## 2018-07-31 DIAGNOSIS — R531 Weakness: Secondary | ICD-10-CM | POA: Diagnosis present

## 2018-07-31 LAB — BLOOD GAS, VENOUS
Acid-Base Excess: 4.1 mmol/L — ABNORMAL HIGH (ref 0.0–2.0)
BICARBONATE: 28.5 mmol/L — AB (ref 20.0–28.0)
O2 Saturation: 97.7 %
PCO2 VEN: 41 mmHg — AB (ref 44.0–60.0)
PH VEN: 7.45 — AB (ref 7.250–7.430)
PO2 VEN: 95 mmHg — AB (ref 32.0–45.0)
Patient temperature: 37

## 2018-07-31 LAB — URINALYSIS, COMPLETE (UACMP) WITH MICROSCOPIC
BILIRUBIN URINE: NEGATIVE
Glucose, UA: NEGATIVE mg/dL
KETONES UR: NEGATIVE mg/dL
Nitrite: NEGATIVE
Protein, ur: NEGATIVE mg/dL
Specific Gravity, Urine: 1.015 (ref 1.005–1.030)
pH: 5 (ref 5.0–8.0)

## 2018-07-31 LAB — CBC WITH DIFFERENTIAL/PLATELET
ABS IMMATURE GRANULOCYTES: 0 10*3/uL (ref 0.00–0.07)
Basophils Absolute: 0 10*3/uL (ref 0.0–0.1)
Basophils Relative: 1 %
Eosinophils Absolute: 0.1 10*3/uL (ref 0.0–0.5)
Eosinophils Relative: 1 %
HCT: 35.3 % — ABNORMAL LOW (ref 39.0–52.0)
Hemoglobin: 11.3 g/dL — ABNORMAL LOW (ref 13.0–17.0)
IMMATURE GRANULOCYTES: 0 %
LYMPHS PCT: 25 %
Lymphs Abs: 1.1 10*3/uL (ref 0.7–4.0)
MCH: 21.5 pg — AB (ref 26.0–34.0)
MCHC: 32 g/dL (ref 30.0–36.0)
MCV: 67.1 fL — AB (ref 80.0–100.0)
MONO ABS: 0.6 10*3/uL (ref 0.1–1.0)
Monocytes Relative: 15 %
NEUTROS ABS: 2.4 10*3/uL (ref 1.7–7.7)
NEUTROS PCT: 58 %
PLATELETS: 105 10*3/uL — AB (ref 150–400)
RBC: 5.26 MIL/uL (ref 4.22–5.81)
RDW: 13.6 % (ref 11.5–15.5)
WBC: 4.2 10*3/uL (ref 4.0–10.5)
nRBC: 0 % (ref 0.0–0.2)

## 2018-07-31 LAB — COMPREHENSIVE METABOLIC PANEL
ALT: 26 U/L (ref 0–44)
ANION GAP: 9 (ref 5–15)
AST: 46 U/L — ABNORMAL HIGH (ref 15–41)
Albumin: 3.7 g/dL (ref 3.5–5.0)
Alkaline Phosphatase: 97 U/L (ref 38–126)
BUN: 18 mg/dL (ref 8–23)
CHLORIDE: 104 mmol/L (ref 98–111)
CO2: 25 mmol/L (ref 22–32)
CREATININE: 0.78 mg/dL (ref 0.61–1.24)
Calcium: 9.2 mg/dL (ref 8.9–10.3)
Glucose, Bld: 211 mg/dL — ABNORMAL HIGH (ref 70–99)
POTASSIUM: 3.7 mmol/L (ref 3.5–5.1)
SODIUM: 138 mmol/L (ref 135–145)
Total Bilirubin: 1.4 mg/dL — ABNORMAL HIGH (ref 0.3–1.2)
Total Protein: 6.5 g/dL (ref 6.5–8.1)

## 2018-07-31 LAB — AMMONIA: Ammonia: 47 umol/L — ABNORMAL HIGH (ref 9–35)

## 2018-07-31 MED ORDER — SODIUM CHLORIDE 0.9 % IV BOLUS
500.0000 mL | Freq: Once | INTRAVENOUS | Status: AC
  Administered 2018-07-31: 500 mL via INTRAVENOUS

## 2018-07-31 MED ORDER — LACTULOSE 10 GM/15ML PO SOLN
30.0000 g | Freq: Once | ORAL | Status: AC
  Administered 2018-08-01: 30 g via ORAL
  Filled 2018-07-31: qty 60

## 2018-07-31 NOTE — ED Notes (Signed)
Pt is back from medical imaging. 

## 2018-07-31 NOTE — ED Triage Notes (Signed)
Pt is brought in via EMS, due to feeling weak in his legs pt denies any chest pain or SOB. Pts son stated that last time pt was like this his ammonia level was elevated but he has a stroke before as well.

## 2018-07-31 NOTE — ED Notes (Signed)
Pt is able to swallow without difficulty.

## 2018-07-31 NOTE — ED Notes (Signed)
Pt going to medical imaging.   

## 2018-07-31 NOTE — ED Notes (Signed)
Pt is on the toilet at this time trying to provide a urine sample.

## 2018-07-31 NOTE — Discharge Instructions (Addendum)
Follow up with Dr. Doy Hutching.  Return for any additional questions or concerns.  Results for orders placed or performed during the hospital encounter of 07/31/18  CBC with Differential/Platelet  Result Value Ref Range   WBC 4.2 4.0 - 10.5 K/uL   RBC 5.26 4.22 - 5.81 MIL/uL   Hemoglobin 11.3 (L) 13.0 - 17.0 g/dL   HCT 35.3 (L) 39.0 - 52.0 %   MCV 67.1 (L) 80.0 - 100.0 fL   MCH 21.5 (L) 26.0 - 34.0 pg   MCHC 32.0 30.0 - 36.0 g/dL   RDW 13.6 11.5 - 15.5 %   Platelets 105 (L) 150 - 400 K/uL   nRBC 0.0 0.0 - 0.2 %   Neutrophils Relative % 58 %   Neutro Abs 2.4 1.7 - 7.7 K/uL   Lymphocytes Relative 25 %   Lymphs Abs 1.1 0.7 - 4.0 K/uL   Monocytes Relative 15 %   Monocytes Absolute 0.6 0.1 - 1.0 K/uL   Eosinophils Relative 1 %   Eosinophils Absolute 0.1 0.0 - 0.5 K/uL   Basophils Relative 1 %   Basophils Absolute 0.0 0.0 - 0.1 K/uL   Immature Granulocytes 0 %   Abs Immature Granulocytes 0.00 0.00 - 0.07 K/uL   Schistocytes PRESENT    Basophilic Stippling PRESENT    Target Cells PRESENT    Ovalocytes PRESENT   Comprehensive metabolic panel  Result Value Ref Range   Sodium 138 135 - 145 mmol/L   Potassium 3.7 3.5 - 5.1 mmol/L   Chloride 104 98 - 111 mmol/L   CO2 25 22 - 32 mmol/L   Glucose, Bld 211 (H) 70 - 99 mg/dL   BUN 18 8 - 23 mg/dL   Creatinine, Ser 0.78 0.61 - 1.24 mg/dL   Calcium 9.2 8.9 - 10.3 mg/dL   Total Protein 6.5 6.5 - 8.1 g/dL   Albumin 3.7 3.5 - 5.0 g/dL   AST 46 (H) 15 - 41 U/L   ALT 26 0 - 44 U/L   Alkaline Phosphatase 97 38 - 126 U/L   Total Bilirubin 1.4 (H) 0.3 - 1.2 mg/dL   GFR calc non Af Amer >60 >60 mL/min   GFR calc Af Amer >60 >60 mL/min   Anion gap 9 5 - 15  Ammonia  Result Value Ref Range   Ammonia 47 (H) 9 - 35 umol/L  Blood gas, venous  Result Value Ref Range   pH, Ven 7.45 (H) 7.250 - 7.430   pCO2, Ven 41 (L) 44.0 - 60.0 mmHg   pO2, Ven 95.0 (H) 32.0 - 45.0 mmHg   Bicarbonate 28.5 (H) 20.0 - 28.0 mmol/L   Acid-Base Excess 4.1 (H) 0.0 -  2.0 mmol/L   O2 Saturation 97.7 %   Patient temperature 37.0    Collection site VENOUS    Sample type VENOUS   Urinalysis, Complete w Microscopic  Result Value Ref Range   Color, Urine AMBER (A) YELLOW   APPearance CLEAR (A) CLEAR   Specific Gravity, Urine 1.015 1.005 - 1.030   pH 5.0 5.0 - 8.0   Glucose, UA NEGATIVE NEGATIVE mg/dL   Hgb urine dipstick MODERATE (A) NEGATIVE   Bilirubin Urine NEGATIVE NEGATIVE   Ketones, ur NEGATIVE NEGATIVE mg/dL   Protein, ur NEGATIVE NEGATIVE mg/dL   Nitrite NEGATIVE NEGATIVE   Leukocytes, UA SMALL (A) NEGATIVE   RBC / HPF 0-5 0 - 5 RBC/hpf   WBC, UA 11-20 0 - 5 WBC/hpf   Bacteria, UA RARE (A) NONE  SEEN   Squamous Epithelial / LPF 0-5 0 - 5   Mucus PRESENT    Dg Chest 2 View  Result Date: 07/31/2018 CLINICAL DATA:  Weakness. EXAM: CHEST - 2 VIEW COMPARISON:  May 26, 2018 FINDINGS: Stable pacemaker and cardiomegaly. The hila, mediastinum, lungs, and pleura are unremarkable. IMPRESSION: No active cardiopulmonary disease. Electronically Signed   By: Dorise Bullion III M.D   On: 07/31/2018 21:46   Ct Head Wo Contrast  Result Date: 07/31/2018 CLINICAL DATA:  Weakness. EXAM: CT HEAD WITHOUT CONTRAST TECHNIQUE: Contiguous axial images were obtained from the base of the skull through the vertex without intravenous contrast. COMPARISON:  05/27/2018 FINDINGS: Brain: The ventricles and cisterns are within normal. There is mild age related atrophic change. There is mild chronic ischemic microvascular disease. Prominent perivascular spaces inferior to the lentiform nucleus. Old small infarct adjacent over the right periventricular white matter. No mass, mass effect or midline shift. No acute hemorrhage. No evidence of acute infarction. Vascular: No hyperdense vessel or unexpected calcification. Skull: Normal. Negative for fracture or focal lesion. Sinuses/Orbits: Sinuses are partially visualized with moderate opacification over the right ethmoid air  cells. Partially visualized orbits are within normal. Other: None. IMPRESSION: No acute findings. 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 periventricular infarct. Chronic sinus inflammatory change. Electronically Signed   By: Marin Olp M.D.   On: 07/31/2018 21:13   US Abdomen Complete  Result Date: 07/14/2018 CLINICAL DATA:  Hepatic encephalopathy syndrome, hyperammonemia EXAM: ABDOMEN ULTRASOUND COMPLETE COMPARISON:  None FINDINGS: Gallbladder: Gallbladder filled with shadowing calculi. Suboptimal assessment of wall thickness. No gross gallbladder wall thickening or pericholecystic fluid. No sonographic Murphy sign. Common bile duct: Diameter: 2 mm diameter, normal Liver: Coarsened suspect mildly increased hepatic echogenicity. No focal hepatic mass lesion. Hepatic margins are minimally irregular, cannot exclude cirrhosis. Portal vein is patent on color Doppler imaging with normal direction of blood flow towards the liver. IVC: Normal appearance Pancreas: Tail incompletely seen, visualized portion normal appearance. Spleen: 15.4 cm length with calculated volume of 1060 mL, enlarged. No focal abnormalities. Right Kidney: Length: 13.6 cm. Increased cortical echogenicity. Normal cortical thickness. Complex mass at lateral RIGHT kidney 5.6 x 5.1 x 4.3 cm question neoplasm. No hydronephrosis. Left Kidney: Length: 14.9 cm. Cyst at upper pole 3.9 x 3.0 x 2.8 cm. Normal cortical thickness. Increased cortical echogenicity. No hydronephrosis Abdominal aorta: Normal caliber, suboptimally visualized. Other findings: No free fluid. IMPRESSION: Gallbladder filled with shadowing calculi. Coarsened increased hepatic echogenicity which can be seen with cirrhosis and certain infiltrative disorders. Splenomegaly. Solid-appearing RIGHT renal mass 5.6 cm greatest size concerning for renal neoplasm; recommend follow-up MR assessment to exclude renal neoplasm. LEFT renal cyst. Electronically Signed    By: Lavonia Dana M.D.   On: 07/14/2018 15:24   Ct Abdomen Pelvis W Contrast  Result Date: 07/19/2018 CLINICAL DATA:  Cirrhosis. Hepatic cephalopathy syndrome. Renal mass on recent ultrasound. EXAM: CT ABDOMEN AND PELVIS WITH CONTRAST TECHNIQUE: Multidetector CT imaging of the abdomen and pelvis was performed using the standard protocol following bolus administration of intravenous contrast. CONTRAST:  178mL ISOVUE-300 IOPAMIDOL (ISOVUE-300) INJECTION 61% COMPARISON:  Ultrasound on 07/14/2018 FINDINGS: Lower Chest: 5 mm noncalcified pulmonary nodule in right middle lobe. Hepatobiliary: Hepatic cirrhosis demonstrated. No liver masses are identified. Portal and hepatic veins are patent. Gallbladder is filled with calcified gallstones. No evidence of cholecystitis or biliary ductal dilatation. Pancreas:  No mass or inflammatory changes. Spleen: Moderate splenomegaly, consistent with portal venous hypertension. Adrenals/Urinary Tract: Normal  adrenal glands. Several small left renal cysts are noted. A solid mass with central necrosis is seen in the upper and midpole of the right kidney measuring 7.4 x 6.2 cm. This is consistent with renal cell carcinoma. No evidence of ureteral calculi or hydronephrosis. Unremarkable unopacified urinary bladder. Stomach/Bowel: No evidence of obstruction, inflammatory process or abnormal fluid collections. Normal appendix visualized. Diverticulosis is seen mainly involving the sigmoid colon, however there is no evidence of diverticulitis. Vascular/Lymphatic: No pathologically enlarged lymph nodes. No abdominal aortic aneurysm. Recanalization of paraumbilical veins, consistent with portal venous hypertension. Reproductive:  No mass or other significant abnormality. Other:  No evidence of ascites. Musculoskeletal:  No suspicious bone lesions identified. IMPRESSION: 7.4 cm right renal mass with central necrosis, consistent with renal cell carcinoma. No evidence of abdominal or pelvic  metastatic disease. 5 mm indeterminate pulmonary nodule in right middle lobe. Consider chest CT without contrast for further evaluation. Hepatic cirrhosis and findings of portal venous hypertension. No evidence of hepatocellular carcinoma. Cholelithiasis.  No radiographic evidence of cholecystitis. Colonic diverticulosis, without radiographic evidence of diverticulitis. Electronically Signed   By: Earle Gell M.D.   On: 07/19/2018 12:20

## 2018-07-31 NOTE — ED Provider Notes (Signed)
[facility] Emergency Department Provider Note    First MD Initiated Contact with Patient 07/31/18 2018     (approximate)  I have reviewed the triage vital signs and the nursing notes.   HISTORY  Chief Complaint Altered Mental Status    HPI [name] Day is a 77 y.o. male extensive past medical history presents the ER due to feeling weak and being unsteady on his feet.  Patient with history of cirrhosis as well as recent stroke.  Is also had multiple medication changes over the past several weeks.  Denies any chest pain.  No leg pain.  States that he has had some noncompliance with his lactulose.   Past Medical History:  Diagnosis Date  . Anemia   . Bipolar 1 disorder ([place])   . Cancer ([place])   . Carotid artery occlusion   . Dementia ([place])   . Diabetes mellitus without complication ([place])   . Diabetic peripheral neuropathy ([place]) 03/03/2018  . Gait abnormality 02/12/2017  . Hypertension   . Left-sided Bell's palsy 03/03/2018  . Memory disorder 02/12/2017  . TIA (transient ischemic attack)    Family History  Problem Relation Age of Onset  . Allergies Mother   . Diabetes Father   . Diabetes Sister    Past Surgical History:  Procedure Laterality Date  . COLONOSCOPY WITH PROPOFOL N/A 02/25/2017   Procedure: COLONOSCOPY WITH PROPOFOL;  Surgeon: Manya Silvas, MD;  Location: [facility] ENDOSCOPY;  Service: Endoscopy;  Laterality: N/A;  . ESOPHAGOGASTRODUODENOSCOPY (EGD) WITH PROPOFOL N/A 02/25/2017   Procedure: ESOPHAGOGASTRODUODENOSCOPY (EGD) WITH PROPOFOL;  Surgeon: Manya Silvas, MD;  Location: [facility] ENDOSCOPY;  Service: Endoscopy;  Laterality: N/A;  . NASAL SINUS SURGERY    . PACEMAKER PLACEMENT    . TONSILLECTOMY    . TONSILLECTOMY     Patient Active Problem List   Diagnosis Date Noted  . Hyperammonemia ([place]) 06/28/2018  . Hepatic failure, unspecified without coma ([place]) 06/02/2018  . Anemia 05/26/2018  . Dyslipidemia associated with type 2  diabetes mellitus ([place]) 05/22/2018  . GERD without esophagitis 05/22/2018  . Somnolence 05/22/2018  . Bipolar 1 disorder ([place]) 05/12/2018  . Acute right hip pain 04/28/2018  . Frequent falls 04/24/2018  . Expressive aphasia 04/24/2018  . Left-sided Bell's palsy 03/03/2018  . Diabetic peripheral neuropathy ([place]) 03/03/2018  . Abnormal LFTs 09/08/2017  . 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 ED (erectile dysfunction) 09/08/2017  . Environmental allergies 09/08/2017  . Nephrolithiasis 09/08/2017  . Non-cardiac chest pain 09/08/2017  . Rectal bleeding 09/08/2017  . Sinusitis 09/08/2017  . Varicose veins of leg with swelling, bilateral 06/12/2017  . Venous ulcer ([place]) 05/18/2017  . Chronic venous insufficiency 05/18/2017  . Memory disorder 02/12/2017  . Gait abnormality 02/12/2017  . CKD stage 3 due to type 2 diabetes mellitus ([place]) 02/06/2017  . Hyperlipidemia 02/06/2017  . Swelling of limb 02/06/2017  . Lymphedema 02/06/2017  . Heme positive stool 12/04/2016  . 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 Cardiac pacemaker in situ 04/27/2016  . AV heart block 04/24/2016  . Hyperglycemia 04/24/2016  . Generalized weakness 04/24/2016  . Syncope and collapse 04/24/2016  . Metal bone fixation hardware in place 04/21/2016  . Bilateral carotid artery stenosis 02/05/2015  . OSA (obstructive sleep apnea) 02/05/2015  . Hyperlipidemia due to type 2 diabetes mellitus ([place]) 02/05/2015  . Benign essential hypertension 01/19/2015  . Hypertension associated with diabetes ([place]) 01/19/2015  . B12 deficiency 05/23/2014  . 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 Long-term insulin use ([place]) 05/22/2014  . Type II diabetes mellitus with manifestations, uncontrolled ([place]) 05/22/2014  . Aortic stenosis, moderate 11/14/2013  . ANEMIA-NOS 03/29/2007  . Anxiety 03/29/2007  . DEPRESSION 03/29/2007  . ALLERGIC RHINITIS 03/29/2007  . ASTHMA 03/29/2007  . Osteoarthritis 03/29/2007  .  NEPHROLITHIASIS, HX OF 03/29/2007      Prior to Admission medications   Medication Sig Start Date End Date Taking? Authorizing Provider  allopurinol (ZYLOPRIM) 300 MG tablet Take 1 tablet (300 mg total) by mouth daily as needed (gout). 05/12/18 05/12/19  Medina-Vargas, Monina C, NP  AMBULATORY NON FORMULARY MEDICATION BiPAP machine @ 15/12 DX: OSA DX Code: 06/01/18   Salary, Avel Peace, MD  amphetamine-dextroamphetamine (ADDERALL) 10 MG tablet Take 1 tablet (10 mg total) by mouth daily. 06/16/18   Gerlene Fee, NP  aspirin 325 MG tablet Take 325 mg by mouth daily.    [provider]  Cholecalciferol (VITAMIN D3) 1000 units CAPS Take by mouth.    [provider]  Continuous Blood Gluc Sensor (DEXCOM G6 SENSOR) MISC Use 1 each every 30 (thirty) days 05/18/18   [provider]  Continuous Blood Gluc Transmit (DEXCOM G6 TRANSMITTER) MISC Use 1 Device 4 (four) times daily 05/18/18   [provider]  diclofenac sodium (VOLTAREN) 1 % GEL Apply 4 g topically daily. Apply to right hip for pain    [provider]  FREESTYLE LITE test strip 1 each by Other route as directed. 05/12/18   Medina-Vargas, Monina C, NP  gabapentin (NEURONTIN) 100 MG capsule Take 2 capsules (200 mg total) by mouth 3 (three) times daily. 05/12/18 05/12/19  Medina-Vargas, Monina C, NP  insulin aspart (NOVOLOG) 100 UNIT/ML injection Inject 30 Units into the skin 3 (three) times daily before meals.  06/08/18   [provider]  insulin glargine (LANTUS) 100 UNIT/ML injection Inject 30 Units into the skin at bedtime.  06/12/18   [provider]  lactulose (CHRONULAC) 10 GM/15ML solution Take 15 mLs (10 g total) by mouth daily. 06/02/18   Salary, Avel Peace, MD  lamoTRIgine (LAMICTAL) 25 MG tablet Take 2 tablets (50 mg total) by mouth 2 (two) times daily. 05/12/18   Medina-Vargas, Monina C, NP  lidocaine (LIDODERM) 5 % Place 1 patch onto the skin daily. Remove & Discard patch within  12 hours or as directed by MD 06/01/18   Salary, Avel Peace, MD  liver oil-zinc oxide (DESITIN) 40 % ointment Apply topically 3 (three) times daily as needed for irritation. 06/01/18   Salary, Avel Peace, MD  Lurasidone HCl (LATUDA) 60 MG TABS Take 1 tablet (60 mg total) by mouth daily. 05/12/18   Medina-Vargas, Monina C, NP  meloxicam (MOBIC) 15 MG tablet Take 1 tablet (15 mg total) by mouth daily. 06/02/18   Salary, Avel Peace, MD  metFORMIN (GLUCOPHAGE-XR) 500 MG 24 hr tablet Take by mouth. 07/05/18   [provider]  Multiple Vitamin (MULTI-VITAMINS) TABS Take by mouth.    [provider]  NON FORMULARY Diet Type: NCS    [provider]  nystatin cream (MYCOSTATIN) Apply topically 2 (two) times daily. 06/01/18   Salary, Avel Peace, MD  nystatin-triamcinolone (MYCOLOG II) cream Apply 1 application topically 2 (two) times daily. 06/18/18   Stoioff, Ronda Fairly, MD  omeprazole (PRILOSEC) 40 MG capsule Take 1 capsule (40 mg total) by mouth daily. 05/12/18   Medina-Vargas, Monina C, NP  tamsulosin (FLOMAX) 0.4 MG CAPS capsule Take 1 capsule (0.4 mg  total) by mouth 2 (two) times daily. 07/06/18   Stoioff, Ronda Fairly, MD  XIFAXAN 550 MG TABS tablet Take 550 mg by mouth 2 (two) times daily. 07/22/18   [provider]  Zinc Gluconate 100 MG TABS Take by mouth.    [provider]    Allergies Lisinopril; Penicillins; and Clindamycin/lincomycin    Social History Social History   Tobacco Use  . Smoking status: Former Smoker    Packs/day: 1.00    Types: Cigarettes, Cigars  . Smokeless tobacco: Never Used  Substance Use Topics  . Alcohol use: Yes    Comment: occasional  . Drug use: No    Review of Systems Patient denies headaches, rhinorrhea, blurry vision, numbness, shortness of breath, chest pain, edema, cough, abdominal pain, nausea, vomiting, diarrhea, dysuria, fevers, rashes or hallucinations unless otherwise stated above in  HPI. ____________________________________________   PHYSICAL EXAM:  VITAL SIGNS: Vitals:   07/31/18 2300 07/31/18 2330  BP: (!) 141/66 (!) 141/47  Pulse: 66 64  Resp: 19 13  Temp:    SpO2: 100% 98%    Constitutional: Alert pleasant and cooperative.  Eyes: Conjunctivae are normal.  Head: Atraumatic. Nose: No congestion/rhinnorhea. Mouth/Throat: Mucous membranes are moist.   Neck: No stridor. Painless ROM.  Cardiovascular: Normal rate, regular rhythm. Grossly normal heart sounds.  Good peripheral circulation. Respiratory: Normal respiratory effort.  No retractions. Lungs CTAB. Gastrointestinal: Soft and nontender. No distention. No abdominal bruits. No CVA tenderness. Genitourinary: deferred Musculoskeletal: No lower extremity tenderness nor edema.  No joint effusions. Neurologic:  No asterixis, normal speech and language. No gross focal neurologic deficits are appreciated. No facial droop Skin:  Skin is warm, dry and intact. No rash noted. Psychiatric: pleasant and cooperative  ____________________________________________   LABS (all labs ordered are listed, but only abnormal results are displayed)  Results for orders placed or performed during the hospital encounter of 07/31/18 (from the past 24 hour(s))  CBC with Differential/Platelet     Status: Abnormal   Collection Time: 07/31/18  8:24 PM  Result Value Ref Range   WBC 4.2 4.0 - 10.5 K/uL   RBC 5.26 4.22 - 5.81 MIL/uL   Hemoglobin 11.3 (L) 13.0 - 17.0 g/dL   HCT 35.3 (L) 39.0 - 52.0 %   MCV 67.1 (L) 80.0 - 100.0 fL   MCH 21.5 (L) 26.0 - 34.0 pg   MCHC 32.0 30.0 - 36.0 g/dL   RDW 13.6 11.5 - 15.5 %   Platelets 105 (L) 150 - 400 K/uL   nRBC 0.0 0.0 - 0.2 %   Neutrophils Relative % 58 %   Neutro Abs 2.4 1.7 - 7.7 K/uL   Lymphocytes Relative 25 %   Lymphs Abs 1.1 0.7 - 4.0 K/uL   Monocytes Relative 15 %   Monocytes Absolute 0.6 0.1 - 1.0 K/uL   Eosinophils Relative 1 %   Eosinophils Absolute 0.1 0.0 - 0.5 K/uL    Basophils Relative 1 %   Basophils Absolute 0.0 0.0 - 0.1 K/uL   Immature Granulocytes 0 %   Abs Immature Granulocytes 0.00 0.00 - 0.07 K/uL   Schistocytes PRESENT    Basophilic Stippling PRESENT    Target Cells PRESENT    Ovalocytes PRESENT   Comprehensive metabolic panel     Status: Abnormal   Collection Time: 07/31/18  8:24 PM  Result Value Ref Range   Sodium 138 135 - 145 mmol/L   Potassium 3.7 3.5 - 5.1 mmol/L   Chloride 104 98 - 111  mmol/L   CO2 25 22 - 32 mmol/L   Glucose, Bld 211 (H) 70 - 99 mg/dL   BUN 18 8 - 23 mg/dL   Creatinine, Ser 0.78 0.61 - 1.24 mg/dL   Calcium 9.2 8.9 - 10.3 mg/dL   Total Protein 6.5 6.5 - 8.1 g/dL   Albumin 3.7 3.5 - 5.0 g/dL   AST 46 (H) 15 - 41 U/L   ALT 26 0 - 44 U/L   Alkaline Phosphatase 97 38 - 126 U/L   Total Bilirubin 1.4 (H) 0.3 - 1.2 mg/dL   GFR calc non Af Amer >60 >60 mL/min   GFR calc Af Amer >60 >60 mL/min   Anion gap 9 5 - 15  Ammonia     Status: Abnormal   Collection Time: 07/31/18  8:24 PM  Result Value Ref Range   Ammonia 47 (H) 9 - 35 umol/L  Blood gas, venous     Status: Abnormal   Collection Time: 07/31/18  8:36 PM  Result Value Ref Range   pH, Ven 7.45 (H) 7.250 - 7.430   pCO2, Ven 41 (L) 44.0 - 60.0 mmHg   pO2, Ven 95.0 (H) 32.0 - 45.0 mmHg   Bicarbonate 28.5 (H) 20.0 - 28.0 mmol/L   Acid-Base Excess 4.1 (H) 0.0 - 2.0 mmol/L   O2 Saturation 97.7 %   Patient temperature 37.0    Collection site VENOUS    Sample type VENOUS    ____________________________________________  EKG My review and personal interpretation at Time: 20:23   Indication: ams  Rate: 80  Rhythm: a-v paced Axis: left Other: paced rhythm, abn ekg ____________________________________________  RADIOLOGY  I personally reviewed all radiographic images ordered to evaluate for the above acute complaints and reviewed radiology reports and findings.  These findings were personally discussed with the patient.  Please see medical record for  radiology report.  ____________________________________________   PROCEDURES  Procedure(s) performed:  Procedures    Critical Care performed: no ____________________________________________   INITIAL IMPRESSION / ASSESSMENT AND PLAN / ED COURSE  Pertinent labs & imaging results that were available during my care of the patient were reviewed by me and considered in my medical decision making (see chart for details).   DDX: Dehydration, sepsis, pna, uti, hypoglycemia, cva, drug effect, withdrawal, encephalitis   OLA FAWVER is a 77 y.o. who presents to the ED with symptoms as described above.  Broad differential but the patient is nontoxic-appearing.  Will order CT head to exclude traumatic injury.  Doubt stroke or mass lesion.  Certainly possible metabolic process.  Blood work will be sent for the above differential.  Will check urine as well.  Doubt hepatic encephalopathy.  Patient is on a number of medications that could also be causing some confusion or unsteadiness.  Clinical Course as of Aug 01 2335  Sat Jul 31, 2018  2209 The patient's son who is a paramedic here to talk to me outside the patient's room states that he is concerned that patient may be playing up his symptoms.  He is been struggling with recently moving in with family these to be very independent.  States he is currently acting at his baseline.  Recognizes poor prognosis given the patient's multiple comorbidities.  States that he would prefer to take the patient home if at all possible.   [PR]  2331 Still awaiting urinalysis.  Will give dose of lactulose but as described above family would prefer to take patient home assuming there is no  evidence of UTI or other explanation for the patient's encephalopathy.   [PR]    Clinical Course User Index [PR] Merlyn Lot, MD     As part of my medical decision making, I reviewed the following data within the [place] notes reviewed  and incorporated, Labs reviewed, notes from prior ED visits.   ____________________________________________   FINAL CLINICAL IMPRESSION(S) / ED DIAGNOSES  Final diagnoses:  Altered mental status, unspecified altered mental status type      NEW MEDICATIONS STARTED DURING THIS VISIT:  New Prescriptions   No medications on file     Note:  This document was prepared using Dragon voice recognition software and may include unintentional dictation errors.    Merlyn Lot, MD 07/31/18 [phone]

## 2018-08-01 DIAGNOSIS — R4182 Altered mental status, unspecified: Secondary | ICD-10-CM | POA: Diagnosis not present

## 2018-08-01 MED ORDER — SODIUM CHLORIDE 0.9 % IV SOLN
1.0000 g | Freq: Once | INTRAVENOUS | Status: AC
  Administered 2018-08-01: 1 g via INTRAVENOUS
  Filled 2018-08-01: qty 10

## 2018-08-01 MED ORDER — CEPHALEXIN 500 MG PO CAPS: 500.0000 mg | ORAL_CAPSULE | Freq: Three times a day (TID) | ORAL | 0 refills | Status: AC

## 2018-08-01 MED ORDER — NYSTATIN 100000 UNIT/GM EX OINT: 1.0000 "application " | TOPICAL_OINTMENT | Freq: Two times a day (BID) | CUTANEOUS | 0 refills | Status: DC

## 2018-08-01 NOTE — ED Provider Notes (Signed)
The patient's urinalysis is equivocal but does look like probably an infection.  Culture sent.  1 g of ceftriaxone as the patient is penicillin allergic but not cephalosporin allergic.  Also give 10 days of Keflex and he will be discharged home.   Darel Hong, MD 08/01/18 [phone]

## 2018-08-02 ENCOUNTER — Other Ambulatory Visit
Admission: RE | Admit: 2018-08-02 | Discharge: 2018-08-02 | Disposition: A | Discharge status: Home / Self Care | Payer: Medicare Other | Source: Ambulatory Visit | Attending: Internal Medicine | Admitting: Internal Medicine

## 2018-08-02 ENCOUNTER — Telehealth: Payer: Self-pay | Admitting: Urology

## 2018-08-02 DIAGNOSIS — E7220 Disorder of urea cycle metabolism, unspecified: Secondary | ICD-10-CM | POA: Diagnosis present

## 2018-08-02 LAB — AMMONIA: Ammonia: 31 umol/L (ref 9–35)

## 2018-08-02 NOTE — Telephone Encounter (Signed)
Yes, ok to refill flomax

## 2018-08-02 NOTE — Telephone Encounter (Signed)
Okay to fill? 

## 2018-08-02 NOTE — Telephone Encounter (Signed)
Pt needs refill for Flomax sent to [facility].  He also needs a new prescription for Nystatin Ointment.

## 2018-08-03 LAB — URINE CULTURE: Culture: >=100000 — AB

## 2018-08-03 MED ORDER — TAMSULOSIN HCL 0.4 MG PO CAPS: 0.4000 mg | ORAL_CAPSULE | Freq: Two times a day (BID) | ORAL | 0 refills | Status: DC

## 2018-08-05 ENCOUNTER — Telehealth: Payer: Self-pay | Admitting: Urology

## 2018-08-05 NOTE — Telephone Encounter (Signed)
He cannot take 0.8mg  BID, this dose is too high and can cause dizziness, low BP, and falls. I would recommend 0.8mg  nightly.  Nickolas Madrid, MD 08/05/2018

## 2018-08-05 NOTE — Telephone Encounter (Signed)
Pt LMOM he needs refill on Flomax.  Please call pt [phone]

## 2018-08-05 NOTE — Telephone Encounter (Signed)
Patient states the prescription of Flomax you sent on 08/03/18 was for 1 tablet 2 times daily. He states it should be 2 tablets 2 times daily.

## 2018-08-06 NOTE — Telephone Encounter (Signed)
Patient notified and voiced understanding.

## 2018-09-08 ENCOUNTER — Encounter: Payer: Self-pay | Admitting: *Deleted

## 2018-09-08 ENCOUNTER — Other Ambulatory Visit: Payer: Self-pay

## 2018-09-09 NOTE — Discharge Instructions (Signed)

## 2018-09-13 ENCOUNTER — Other Ambulatory Visit: Payer: Self-pay | Admitting: Adult Health

## 2018-09-13 DIAGNOSIS — F319 Bipolar disorder, unspecified: Secondary | ICD-10-CM

## 2018-09-13 NOTE — Anesthesia Preprocedure Evaluation (Addendum)
Anesthesia Evaluation  Patient identified by MRN, date of birth, ID band Patient awake    Reviewed: Allergy & Precautions, NPO status , Patient's Chart, lab work & pertinent test results  History of Anesthesia Complications Negative for: history of anesthetic complications  Airway Mallampati: III   Neck ROM: Full    Dental  (+) Partial Upper   Pulmonary sleep apnea , former smoker,    Pulmonary exam normal breath sounds clear to auscultation       Cardiovascular hypertension, + dysrhythmias (a fib) + pacemaker (placed 2017) + Valvular Problems/Murmurs AS  Rhythm:Regular Rate:Normal + Systolic murmurs    Neuro/Psych PSYCHIATRIC DISORDERS Anxiety Depression Bipolar Disorder Dementia TIACVA (left-sided weakness; uses walker or wheelchair)    GI/Hepatic GERD  ,(+) Cirrhosis  (non alcoholic)      ,   Endo/Other  diabetes, Type 2  Renal/GU Renal disease (renal mass)     Musculoskeletal   Abdominal   Peds  Hematology  (+) Blood dyscrasia, anemia ,   Anesthesia Other Findings Cardiology note 08/31/18:  Assessment   78 y.o. male with  1. Aortic stenosis, moderate  2. Hypertension associated with diabetes (CMS-HCC)  3. Bilateral carotid artery stenosis  4. Cerebrovascular accident (CVA), unspecified mechanism (CMS-HCC)  5. Mixed dementia (CMS-HCC)  6. OSA (obstructive sleep apnea)  7. Type 2 diabetes mellitus with hyperglycemia, with long-term current use of insulin (CMS-HCC)  8. Hyperlipidemia associated with type 2 diabetes mellitus , unspecified (CMS-HCC)  9. Cardiac pacemaker in situ  10. Morbid obesity with alveolar hypoventilation (CMS-HCC)   78 year old gentleman with moderate aortic stenosis, of uncertain clinical significance with active comorbidities. Patient recently hospitalized with acute lunar infarct, acute encephalopathy, and Hemoccult positive stools with anemia. Pacemaker interrogation reveals  intermittent atrial fibrillation. Patient has chads vas score of 5, and under normal circumstances would be a candidate for chronic anticoagulation. However, patient hospitalized with anemia and Hemoccult-positive stools. Recent GI work-up including abdominal CT revealed 7.4 cm right renal mass consistent with renal cell carcinoma, referred to [facility] for biopsy which has been deferred. Patient has essential hypertension, blood pressure well controlled on current BP medications. Patient returns today, to readdress chronic anticoagulation for atrial fibrillation. We discussed in detail, the risk, benefits alternatives of chronic anticoagulation, as well as, the advantages and disadvantages of warfarin versus novel oral anticoagulants.  Plan   1. DC aspirin 2. Counseled patient about low-sodium diet 3. DASH diet printed instructions given to the patient 4. Counseled patient about low-cholesterol diet 5. Continue pravastatin for hyperlipidemia management 6. Low-fat and cholesterol diet printed instructions given to the patient 7. Defer initiating chronic anticoagulation at this time 8. Pacemaker clinic as scheduled 9. Start ferrous sulfate 325 mg daily 10. Start Eliquis 5 mg twice daily 11. Check CBC in 3 weeks 12. Return to clinic for follow-up in 3 months  No orders of the defined types were placed in this encounter.  Return in about 3 months (around 11/30/2018).  Isaias Cowman, MD PhD [facility]   Reproductive/Obstetrics                            Anesthesia Physical Anesthesia Plan  ASA: IV  Anesthesia Plan: MAC   Post-op Pain Management:    Induction: Intravenous  PONV Risk Score and Plan: 1 and Treatment may vary due to age or medical condition and TIVA  Airway Management Planned: Natural Airway  Additional Equipment:   Intra-op Plan:   Post-operative  Plan:   Informed Consent: I have reviewed the patients History and Physical, chart, labs and  discussed the procedure including the risks, benefits and alternatives for the proposed anesthesia with the patient or authorized representative who has indicated his/her understanding and acceptance.       Plan Discussed with: CRNA  Anesthesia Plan Comments: (Patient is DNR and agrees to suspend for intraoperative and perioperative period.)      Anesthesia Quick Evaluation

## 2018-09-14 ENCOUNTER — Ambulatory Visit: Payer: Medicare Other | Admitting: Podiatry

## 2018-09-15 ENCOUNTER — Encounter
Admission: RE | Disposition: A | Discharge status: Home / Self Care | Payer: Self-pay | Source: Home / Self Care | Attending: Ophthalmology

## 2018-09-15 ENCOUNTER — Ambulatory Visit: Payer: Medicare Other | Admitting: Anesthesiology

## 2018-09-15 ENCOUNTER — Ambulatory Visit
Admission: RE | Admit: 2018-09-15 | Discharge: 2018-09-15 | Disposition: A | Discharge status: Home / Self Care | Payer: Medicare Other | Attending: Ophthalmology | Admitting: Ophthalmology

## 2018-09-15 DIAGNOSIS — Z881 Allergy status to other antibiotic agents status: Secondary | ICD-10-CM | POA: Diagnosis not present

## 2018-09-15 DIAGNOSIS — Z87891 Personal history of nicotine dependence: Secondary | ICD-10-CM | POA: Insufficient documentation

## 2018-09-15 DIAGNOSIS — F319 Bipolar disorder, unspecified: Secondary | ICD-10-CM | POA: Insufficient documentation

## 2018-09-15 DIAGNOSIS — K7460 Unspecified cirrhosis of liver: Secondary | ICD-10-CM | POA: Diagnosis not present

## 2018-09-15 DIAGNOSIS — E785 Hyperlipidemia, unspecified: Secondary | ICD-10-CM | POA: Diagnosis not present

## 2018-09-15 DIAGNOSIS — Z8673 Personal history of transient ischemic attack (TIA), and cerebral infarction without residual deficits: Secondary | ICD-10-CM | POA: Diagnosis not present

## 2018-09-15 DIAGNOSIS — E1136 Type 2 diabetes mellitus with diabetic cataract: Secondary | ICD-10-CM | POA: Diagnosis not present

## 2018-09-15 DIAGNOSIS — I4891 Unspecified atrial fibrillation: Secondary | ICD-10-CM | POA: Diagnosis not present

## 2018-09-15 DIAGNOSIS — H5703 Miosis: Secondary | ICD-10-CM | POA: Insufficient documentation

## 2018-09-15 DIAGNOSIS — Z6833 Body mass index (BMI) 33.0-33.9, adult: Secondary | ICD-10-CM | POA: Diagnosis not present

## 2018-09-15 DIAGNOSIS — F0390 Unspecified dementia without behavioral disturbance: Secondary | ICD-10-CM | POA: Diagnosis not present

## 2018-09-15 DIAGNOSIS — E662 Morbid (severe) obesity with alveolar hypoventilation: Secondary | ICD-10-CM | POA: Diagnosis not present

## 2018-09-15 DIAGNOSIS — G8194 Hemiplegia, unspecified affecting left nondominant side: Secondary | ICD-10-CM | POA: Diagnosis not present

## 2018-09-15 DIAGNOSIS — Z880 Allergy status to penicillin: Secondary | ICD-10-CM | POA: Insufficient documentation

## 2018-09-15 DIAGNOSIS — Z79899 Other long term (current) drug therapy: Secondary | ICD-10-CM | POA: Diagnosis not present

## 2018-09-15 DIAGNOSIS — Z950 Presence of cardiac pacemaker: Secondary | ICD-10-CM | POA: Insufficient documentation

## 2018-09-15 DIAGNOSIS — H2512 Age-related nuclear cataract, left eye: Secondary | ICD-10-CM | POA: Diagnosis not present

## 2018-09-15 HISTORY — DX: Other specified disorders of kidney and ureter: N28.89

## 2018-09-15 HISTORY — DX: Other specified health status: Z78.9

## 2018-09-15 HISTORY — DX: Presence of cardiac pacemaker: Z95.0

## 2018-09-15 HISTORY — DX: Cerebral infarction, unspecified: I63.9

## 2018-09-15 HISTORY — DX: Sleep apnea, unspecified: G47.30

## 2018-09-15 HISTORY — DX: Presence of dental prosthetic device (complete) (partial): Z97.2

## 2018-09-15 HISTORY — PX: CATARACT EXTRACTION W/PHACO: SHX586

## 2018-09-15 HISTORY — DX: Unspecified cirrhosis of liver: K74.60

## 2018-09-15 LAB — GLUCOSE, CAPILLARY: Glucose-Capillary: 124 mg/dL — ABNORMAL HIGH (ref 70–99)

## 2018-09-15 SURGERY — PHACOEMULSIFICATION, CATARACT, WITH IOL INSERTION
Anesthesia: Monitor Anesthesia Care | Site: Eye | Laterality: Left

## 2018-09-15 MED ORDER — MOXIFLOXACIN HCL 0.5 % OP SOLN
1.0000 [drp] | OPHTHALMIC | Status: DC | PRN
  Administered 2018-09-15 (×3): 1 [drp] via OPHTHALMIC

## 2018-09-15 MED ORDER — TETRACAINE HCL 0.5 % OP SOLN
1.0000 [drp] | OPHTHALMIC | Status: DC | PRN
  Administered 2018-09-15 (×2): 1 [drp] via OPHTHALMIC

## 2018-09-15 MED ORDER — ACETAMINOPHEN 160 MG/5ML PO SOLN: 325.0000 mg | ORAL | Status: DC | PRN

## 2018-09-15 MED ORDER — ACETAMINOPHEN 325 MG PO TABS: 650.0000 mg | ORAL_TABLET | Freq: Once | ORAL | Status: DC | PRN

## 2018-09-15 MED ORDER — NA HYALUR & NA CHOND-NA HYALUR 0.4-0.35 ML IO KIT
PACK | INTRAOCULAR | Status: DC | PRN
  Administered 2018-09-15: 1 mL via INTRAOCULAR

## 2018-09-15 MED ORDER — ONDANSETRON HCL 4 MG/2ML IJ SOLN: 4.0000 mg | Freq: Once | INTRAMUSCULAR | Status: DC | PRN

## 2018-09-15 MED ORDER — LACTATED RINGERS IV SOLN: INTRAVENOUS | Status: DC

## 2018-09-15 MED ORDER — MIDAZOLAM HCL 2 MG/2ML IJ SOLN
INTRAMUSCULAR | Status: DC | PRN
  Administered 2018-09-15: 0.5 mg via INTRAVENOUS

## 2018-09-15 MED ORDER — BRIMONIDINE TARTRATE-TIMOLOL 0.2-0.5 % OP SOLN
OPHTHALMIC | Status: DC | PRN
  Administered 2018-09-15: 1 [drp] via OPHTHALMIC

## 2018-09-15 MED ORDER — EPINEPHRINE PF 1 MG/ML IJ SOLN
INTRAOCULAR | Status: DC | PRN
  Administered 2018-09-15: 77 mL via OPHTHALMIC

## 2018-09-15 MED ORDER — ARMC OPHTHALMIC DILATING DROPS
1.0000 "application " | OPHTHALMIC | Status: DC | PRN
  Administered 2018-09-15 (×3): 1 via OPHTHALMIC

## 2018-09-15 MED ORDER — CEFUROXIME OPHTHALMIC INJECTION 1 MG/0.1 ML
INJECTION | OPHTHALMIC | Status: DC | PRN
  Administered 2018-09-15: 0.1 mL via INTRACAMERAL

## 2018-09-15 MED ORDER — LIDOCAINE HCL (PF) 2 % IJ SOLN
INTRAOCULAR | Status: DC | PRN
  Administered 2018-09-15: 2 mL

## 2018-09-15 MED ORDER — FENTANYL CITRATE (PF) 100 MCG/2ML IJ SOLN
INTRAMUSCULAR | Status: DC | PRN
  Administered 2018-09-15: 50 ug via INTRAVENOUS

## 2018-09-15 SURGICAL SUPPLY — 21 items
CANNULA ANT/CHMB 27G (MISCELLANEOUS) ×1 IMPLANT
CANNULA ANT/CHMB 27GA (MISCELLANEOUS) ×3 IMPLANT
GLOVE SURG LX 7.5 STRW (GLOVE) ×2
GLOVE SURG LX STRL 7.5 STRW (GLOVE) ×1 IMPLANT
GLOVE SURG TRIUMPH 8.0 PF LTX (GLOVE) ×3 IMPLANT
GOWN STRL REUS W/ TWL LRG LVL3 (GOWN DISPOSABLE) ×2 IMPLANT
GOWN STRL REUS W/TWL LRG LVL3 (GOWN DISPOSABLE) ×4
LENS IOL TECNIS ITEC 23.0 (Intraocular Lens) ×2 IMPLANT
MARKER SKIN DUAL TIP RULER LAB (MISCELLANEOUS) ×3 IMPLANT
NDL FILTER BLUNT 18X1 1/2 (NEEDLE) ×1 IMPLANT
NEEDLE FILTER BLUNT 18X 1/2SAF (NEEDLE) ×2
NEEDLE FILTER BLUNT 18X1 1/2 (NEEDLE) ×1 IMPLANT
PACK CATARACT BRASINGTON (MISCELLANEOUS) ×3 IMPLANT
PACK EYE AFTER SURG (MISCELLANEOUS) ×3 IMPLANT
PACK OPTHALMIC (MISCELLANEOUS) ×3 IMPLANT
RING MALYGIN 7.0 (MISCELLANEOUS) ×2 IMPLANT
SYR 3ML LL SCALE MARK (SYRINGE) ×3 IMPLANT
SYR 5ML LL (SYRINGE) ×3 IMPLANT
SYR TB 1ML LUER SLIP (SYRINGE) ×3 IMPLANT
WATER STERILE IRR 500ML POUR (IV SOLUTION) ×3 IMPLANT
WIPE NON LINTING 3.25X3.25 (MISCELLANEOUS) ×3 IMPLANT

## 2018-09-15 NOTE — Anesthesia Postprocedure Evaluation (Signed)
Anesthesia Post Note  Patient: [name] Day  Procedure(s) Performed: CATARACT EXTRACTION PHACO AND INTRAOCULAR LENS PLACEMENT (IOC)  COMPLICATED LEFT DIABETIC (Left Eye)  Patient location during evaluation: PACU Anesthesia Type: MAC Level of consciousness: awake and alert, oriented and patient cooperative Pain management: pain level controlled Vital Signs Assessment: post-procedure vital signs reviewed and stable Respiratory status: spontaneous breathing, nonlabored ventilation and respiratory function stable Cardiovascular status: blood pressure returned to baseline and stable Postop Assessment: adequate PO intake Anesthetic complications: no    Darrin Nipper

## 2018-09-15 NOTE — Anesthesia Procedure Notes (Signed)
Procedure Name: MAC Date/Time: 09/15/2018 8:08 AM Performed by: Cameron Ali, CRNA Pre-anesthesia Checklist: Patient identified, Emergency Drugs available, Suction available, Timeout performed and Patient being monitored Patient Re-evaluated:Patient Re-evaluated prior to induction Oxygen Delivery Method: Nasal cannula Placement Confirmation: positive ETCO2

## 2018-09-15 NOTE — Op Note (Signed)
OPERATIVE NOTE  [name] Day [id number] 09/15/2018  PREOPERATIVE DIAGNOSIS:   Nuclear sclerotic cataract left eye with miotic pupil      H25.12   POSTOPERATIVE DIAGNOSIS:   Nuclear sclerotic cataract left eye with miotic pupil.     PROCEDURE:  Phacoemulsification with posterior chamber intraocular lens implantation of the left eye which required pupil stretching with the Malyugin pupil expansion device   LENS:   Implant Name Type Inv. Item Serial No. Manufacturer Lot No. LRB No. Used  LENS IOL DIOP 23.0 - [id number] Intraocular Lens LENS IOL DIOP 23.0 [id number]  Left 1        ULTRASOUND TIME: 19 % of 1 minutes, 10 seconds.  CDE 13.5   SURGEON:  Wyonia Hough, MD   ANESTHESIA: Topical with tetracaine drops and 2% Xylocaine jelly, augmented with 1% preservative-free intracameral lidocaine.   COMPLICATIONS:  None.   DESCRIPTION OF PROCEDURE:  The patient was identified in the holding room and transported to the operating room and placed in the supine position under the operating microscope.  The left eye was identified as the operative eye and it was prepped and draped in the usual sterile ophthalmic fashion.   A 1 millimeter clear-corneal paracentesis was made at the 1:30 position.  The anterior chamber was filled with Viscoat viscoelastic.  0.5 ml of preservative-free 1% lidocaine was injected into the anterior chamber.  A 2.4 millimeter keratome was used to make a near-clear corneal incision at the 10:30 position.  A Malyugin pupil expander was then placed through the main incision and into the anterior chamber of the eye.  The edge of the iris was secured on the lip of the pupil expander and it was released, thereby expanding the pupil to approximately 7 millimeters for completion of the cataract surgery.  Additional Viscoat was placed in the anterior chamber.  A cystotome and capsulorrhexis forceps were used to make a curvilinear capsulorrhexis.   Balanced salt  solution was used to hydrodissect and hydrodelineate the lens nucleus.   Phacoemulsification was used in stop and chop fashion to remove the lens, nucleus and epinucleus.  The remaining cortex was aspirated using the irrigation aspiration handpiece.  Additional Provisc was placed into the eye to distend the capsular bag for lens placement.  A lens was then injected into the capsular bag.  The pupil expanding ring was removed using a Kuglen hook and insertion device. The remaining viscoelastic was aspirated from the capsular bag and the anterior chamber.  The anterior chamber was filled with balanced salt solution to inflate to a physiologic pressure.   Wounds were hydrated with balanced salt solution.  The anterior chamber was inflated to a physiologic pressure with balanced salt solution.  No wound leaks were noted. Cefuroxime 0.1 ml of a 10mg /ml solution was injected into the anterior chamber for a dose of 1 mg of intracameral antibiotic at the completion of the case.   Timolol and Brimonidine drops were applied to the eye.  The patient was taken to the recovery room in stable condition without complications of anesthesia or surgery.  [name] Day 09/15/2018, 8:31 AM

## 2018-09-15 NOTE — Transfer of Care (Signed)
Immediate Anesthesia Transfer of Care Note  Patient: [name] Day  Procedure(s) Performed: CATARACT EXTRACTION PHACO AND INTRAOCULAR LENS PLACEMENT (IOC)  COMPLICATED LEFT DIABETIC (Left Eye)  Patient Location: PACU  Anesthesia Type: MAC  Level of Consciousness: awake, alert  and patient cooperative  Airway and Oxygen Therapy: Patient Spontanous Breathing and Patient connected to supplemental oxygen  Post-op Assessment: Post-op Vital signs reviewed, Patient's Cardiovascular Status Stable, Respiratory Function Stable, Patent Airway and No signs of Nausea or vomiting  Post-op Vital Signs: Reviewed and stable  Complications: No apparent anesthesia complications

## 2018-09-15 NOTE — H&P (Signed)

## 2018-09-16 ENCOUNTER — Encounter: Payer: Self-pay | Admitting: Ophthalmology

## 2018-09-16 ENCOUNTER — Ambulatory Visit: Payer: Medicare Other | Admitting: Neurology

## 2018-09-17 ENCOUNTER — Other Ambulatory Visit: Payer: Self-pay | Admitting: Adult Health

## 2018-09-17 DIAGNOSIS — F319 Bipolar disorder, unspecified: Secondary | ICD-10-CM

## 2018-09-20 ENCOUNTER — Other Ambulatory Visit: Payer: Self-pay | Admitting: Adult Health

## 2018-09-20 DIAGNOSIS — F319 Bipolar disorder, unspecified: Secondary | ICD-10-CM

## 2018-09-23 ENCOUNTER — Ambulatory Visit: Payer: Medicare Other | Admitting: Podiatry

## 2018-09-29 ENCOUNTER — Encounter: Payer: Self-pay | Admitting: Urology

## 2018-09-29 ENCOUNTER — Ambulatory Visit: Payer: Medicare Other | Admitting: Urology

## 2018-10-04 ENCOUNTER — Other Ambulatory Visit
Admission: RE | Admit: 2018-10-04 | Discharge: 2018-10-04 | Disposition: A | Discharge status: Home / Self Care | Payer: Medicare Other | Source: Ambulatory Visit | Attending: Internal Medicine | Admitting: Internal Medicine

## 2018-10-04 DIAGNOSIS — K7290 Hepatic failure, unspecified without coma: Secondary | ICD-10-CM | POA: Diagnosis present

## 2018-10-04 LAB — AMMONIA: Ammonia: 58 umol/L — ABNORMAL HIGH (ref 9–35)

## 2018-10-06 NOTE — Discharge Instructions (Signed)

## 2018-10-11 ENCOUNTER — Encounter: Payer: Self-pay | Admitting: Anesthesiology

## 2018-10-11 ENCOUNTER — Encounter: Payer: Self-pay | Admitting: *Deleted

## 2018-10-11 ENCOUNTER — Other Ambulatory Visit: Payer: Self-pay

## 2018-10-13 ENCOUNTER — Other Ambulatory Visit: Payer: Self-pay

## 2018-10-13 ENCOUNTER — Ambulatory Visit: Admission: RE | Admit: 2018-10-13 | Payer: Medicare Other | Source: Home / Self Care | Admitting: Ophthalmology

## 2018-10-13 SURGERY — PHACOEMULSIFICATION, CATARACT, WITH IOL INSERTION
Anesthesia: Topical | Laterality: Right

## 2018-10-13 MED ORDER — NYSTATIN-TRIAMCINOLONE 100000-0.1 UNIT/GM-% EX CREA: 1.0000 "application " | TOPICAL_CREAM | Freq: Two times a day (BID) | CUTANEOUS | 0 refills | Status: DC

## 2018-10-23 ENCOUNTER — Encounter
Admission: EM | Disposition: A | Discharge status: Home Health | Payer: Self-pay | Source: Home / Self Care | Attending: Internal Medicine

## 2018-10-23 ENCOUNTER — Inpatient Hospital Stay
Admission: EM | Admit: 2018-10-23 | Discharge: 2018-10-29 | DRG: 432 | Disposition: A | Discharge status: Home Health | Payer: Medicare Other | Attending: Internal Medicine | Admitting: Internal Medicine

## 2018-10-23 ENCOUNTER — Emergency Department: Payer: Medicare Other

## 2018-10-23 ENCOUNTER — Inpatient Hospital Stay: Payer: Medicare Other

## 2018-10-23 ENCOUNTER — Encounter: Payer: Self-pay | Admitting: *Deleted

## 2018-10-23 DIAGNOSIS — Z9911 Dependence on respirator [ventilator] status: Secondary | ICD-10-CM

## 2018-10-23 DIAGNOSIS — I10 Essential (primary) hypertension: Secondary | ICD-10-CM | POA: Diagnosis present

## 2018-10-23 DIAGNOSIS — Z66 Do not resuscitate: Secondary | ICD-10-CM | POA: Diagnosis present

## 2018-10-23 DIAGNOSIS — E1142 Type 2 diabetes mellitus with diabetic polyneuropathy: Secondary | ICD-10-CM | POA: Diagnosis present

## 2018-10-23 DIAGNOSIS — I482 Chronic atrial fibrillation, unspecified: Secondary | ICD-10-CM | POA: Diagnosis present

## 2018-10-23 DIAGNOSIS — K760 Fatty (change of) liver, not elsewhere classified: Secondary | ICD-10-CM | POA: Diagnosis present

## 2018-10-23 DIAGNOSIS — D62 Acute posthemorrhagic anemia: Secondary | ICD-10-CM | POA: Diagnosis present

## 2018-10-23 DIAGNOSIS — Z888 Allergy status to other drugs, medicaments and biological substances status: Secondary | ICD-10-CM

## 2018-10-23 DIAGNOSIS — I4430 Unspecified atrioventricular block: Secondary | ICD-10-CM | POA: Diagnosis present

## 2018-10-23 DIAGNOSIS — K920 Hematemesis: Secondary | ICD-10-CM | POA: Diagnosis present

## 2018-10-23 DIAGNOSIS — Z7901 Long term (current) use of anticoagulants: Secondary | ICD-10-CM

## 2018-10-23 DIAGNOSIS — Z881 Allergy status to other antibiotic agents status: Secondary | ICD-10-CM

## 2018-10-23 DIAGNOSIS — R531 Weakness: Secondary | ICD-10-CM | POA: Diagnosis present

## 2018-10-23 DIAGNOSIS — Z9119 Patient's noncompliance with other medical treatment and regimen: Secondary | ICD-10-CM

## 2018-10-23 DIAGNOSIS — Z87891 Personal history of nicotine dependence: Secondary | ICD-10-CM

## 2018-10-23 DIAGNOSIS — I8511 Secondary esophageal varices with bleeding: Secondary | ICD-10-CM | POA: Diagnosis present

## 2018-10-23 DIAGNOSIS — Z7984 Long term (current) use of oral hypoglycemic drugs: Secondary | ICD-10-CM

## 2018-10-23 DIAGNOSIS — Z7189 Other specified counseling: Secondary | ICD-10-CM | POA: Diagnosis not present

## 2018-10-23 DIAGNOSIS — R627 Adult failure to thrive: Secondary | ICD-10-CM | POA: Diagnosis present

## 2018-10-23 DIAGNOSIS — Z880 Allergy status to penicillin: Secondary | ICD-10-CM

## 2018-10-23 DIAGNOSIS — G4733 Obstructive sleep apnea (adult) (pediatric): Secondary | ICD-10-CM | POA: Diagnosis present

## 2018-10-23 DIAGNOSIS — Z8673 Personal history of transient ischemic attack (TIA), and cerebral infarction without residual deficits: Secondary | ICD-10-CM | POA: Diagnosis not present

## 2018-10-23 DIAGNOSIS — Z950 Presence of cardiac pacemaker: Secondary | ICD-10-CM | POA: Diagnosis not present

## 2018-10-23 DIAGNOSIS — K7460 Unspecified cirrhosis of liver: Secondary | ICD-10-CM | POA: Diagnosis present

## 2018-10-23 DIAGNOSIS — F0390 Unspecified dementia without behavioral disturbance: Secondary | ICD-10-CM | POA: Diagnosis present

## 2018-10-23 DIAGNOSIS — Z515 Encounter for palliative care: Secondary | ICD-10-CM | POA: Diagnosis not present

## 2018-10-23 DIAGNOSIS — Z833 Family history of diabetes mellitus: Secondary | ICD-10-CM | POA: Diagnosis not present

## 2018-10-23 DIAGNOSIS — K7290 Hepatic failure, unspecified without coma: Secondary | ICD-10-CM | POA: Diagnosis present

## 2018-10-23 DIAGNOSIS — K922 Gastrointestinal hemorrhage, unspecified: Secondary | ICD-10-CM

## 2018-10-23 DIAGNOSIS — Z9181 History of falling: Secondary | ICD-10-CM | POA: Diagnosis not present

## 2018-10-23 DIAGNOSIS — R296 Repeated falls: Secondary | ICD-10-CM | POA: Diagnosis present

## 2018-10-23 DIAGNOSIS — F319 Bipolar disorder, unspecified: Secondary | ICD-10-CM | POA: Diagnosis present

## 2018-10-23 DIAGNOSIS — Z01818 Encounter for other preprocedural examination: Secondary | ICD-10-CM

## 2018-10-23 DIAGNOSIS — K219 Gastro-esophageal reflux disease without esophagitis: Secondary | ICD-10-CM

## 2018-10-23 DIAGNOSIS — Z79899 Other long term (current) drug therapy: Secondary | ICD-10-CM

## 2018-10-23 DIAGNOSIS — W19XXXA Unspecified fall, initial encounter: Secondary | ICD-10-CM

## 2018-10-23 HISTORY — PX: ESOPHAGOGASTRODUODENOSCOPY: SHX5428

## 2018-10-23 LAB — GLUCOSE, CAPILLARY
Glucose-Capillary: 193 mg/dL — ABNORMAL HIGH (ref 70–99)
Glucose-Capillary: 184 mg/dL — ABNORMAL HIGH (ref 70–99)
Glucose-Capillary: 163 mg/dL — ABNORMAL HIGH (ref 70–99)
Glucose-Capillary: 134 mg/dL — ABNORMAL HIGH (ref 70–99)

## 2018-10-23 LAB — CBC WITH DIFFERENTIAL/PLATELET
Abs Immature Granulocytes: 0.02 10*3/uL (ref 0.00–0.07)
Basophils Absolute: 0 10*3/uL (ref 0.0–0.1)
Basophils Relative: 0 %
Eosinophils Absolute: 0 10*3/uL (ref 0.0–0.5)
Eosinophils Relative: 0 %
HCT: 24.6 % — ABNORMAL LOW (ref 39.0–52.0)
Hemoglobin: 7.7 g/dL — ABNORMAL LOW (ref 13.0–17.0)
Immature Granulocytes: 0 %
Lymphocytes Relative: 16 %
Lymphs Abs: 1 10*3/uL (ref 0.7–4.0)
MCH: 21.3 pg — ABNORMAL LOW (ref 26.0–34.0)
MCHC: 31.3 g/dL (ref 30.0–36.0)
MCV: 68.1 fL — ABNORMAL LOW (ref 80.0–100.0)
Monocytes Absolute: 0.6 10*3/uL (ref 0.1–1.0)
Monocytes Relative: 10 %
Neutro Abs: 4.6 10*3/uL (ref 1.7–7.7)
Neutrophils Relative %: 74 %
Platelets: 142 10*3/uL — ABNORMAL LOW (ref 150–400)
RBC: 3.61 MIL/uL — ABNORMAL LOW (ref 4.22–5.81)
RDW: 18.2 % — ABNORMAL HIGH (ref 11.5–15.5)
Smear Review: NORMAL
WBC: 6.2 10*3/uL (ref 4.0–10.5)
nRBC: 0 % (ref 0.0–0.2)

## 2018-10-23 LAB — COMPREHENSIVE METABOLIC PANEL
ALT: 17 U/L (ref 0–44)
AST: 38 U/L (ref 15–41)
Albumin: 2.9 g/dL — ABNORMAL LOW (ref 3.5–5.0)
Alkaline Phosphatase: 64 U/L (ref 38–126)
Anion gap: 12 (ref 5–15)
BUN: 50 mg/dL — ABNORMAL HIGH (ref 8–23)
CHLORIDE: 106 mmol/L (ref 98–111)
CO2: 20 mmol/L — ABNORMAL LOW (ref 22–32)
Calcium: 8.7 mg/dL — ABNORMAL LOW (ref 8.9–10.3)
Creatinine, Ser: 0.91 mg/dL (ref 0.61–1.24)
GFR calc Af Amer: >60 mL/min (ref >60)
GFR calc non Af Amer: >60 mL/min (ref >60)
Glucose, Bld: 210 mg/dL — ABNORMAL HIGH (ref 70–99)
Potassium: 5.3 mmol/L — ABNORMAL HIGH (ref 3.5–5.1)
Sodium: 138 mmol/L (ref 135–145)
Total Bilirubin: 2.9 mg/dL — ABNORMAL HIGH (ref 0.3–1.2)
Total Protein: 5.5 g/dL — ABNORMAL LOW (ref 6.5–8.1)

## 2018-10-23 LAB — HEMOGLOBIN
Hemoglobin: 7.7 g/dL — ABNORMAL LOW (ref 13.0–17.0)
Hemoglobin: 7.2 g/dL — ABNORMAL LOW (ref 13.0–17.0)

## 2018-10-23 LAB — PROTIME-INR
INR: 2.24
Prothrombin Time: 24.5 seconds — ABNORMAL HIGH (ref 11.4–15.2)

## 2018-10-23 LAB — ABO/RH: ABO/RH(D): O POS

## 2018-10-23 LAB — APTT: aPTT: 42 seconds — ABNORMAL HIGH (ref 24–36)

## 2018-10-23 LAB — TRIGLYCERIDES: Triglycerides: 70 mg/dL (ref <150)

## 2018-10-23 LAB — AMMONIA: Ammonia: 52 umol/L — ABNORMAL HIGH (ref 9–35)

## 2018-10-23 LAB — TROPONIN I

## 2018-10-23 LAB — MRSA PCR SCREENING: MRSA BY PCR: NEGATIVE

## 2018-10-23 SURGERY — EGD (ESOPHAGOGASTRODUODENOSCOPY)

## 2018-10-23 MED ORDER — ONDANSETRON HCL 4 MG/2ML IJ SOLN: 4.0000 mg | Freq: Four times a day (QID) | INTRAMUSCULAR | Status: DC | PRN

## 2018-10-23 MED ORDER — LACTULOSE 10 GM/15ML PO SOLN
10.0000 g | Freq: Two times a day (BID) | ORAL | Status: DC
  Administered 2018-10-25 – 2018-10-29 (×8): 10 g via ORAL
  Filled 2018-10-23 (×10): qty 30

## 2018-10-23 MED ORDER — ROCURONIUM BROMIDE 50 MG/5ML IV SOLN
INTRAVENOUS | Status: AC
  Administered 2018-10-23: 50 mg via INTRAVENOUS
  Filled 2018-10-23: qty 1

## 2018-10-23 MED ORDER — LACTATED RINGERS IV SOLN: INTRAVENOUS | Status: DC

## 2018-10-23 MED ORDER — SODIUM CHLORIDE 0.9 % IV SOLN
80.0000 mg | Freq: Once | INTRAVENOUS | Status: AC
  Administered 2018-10-23: 80 mg via INTRAVENOUS
  Filled 2018-10-23: qty 80

## 2018-10-23 MED ORDER — FENTANYL CITRATE (PF) 100 MCG/2ML IJ SOLN: 50.0000 ug | INTRAMUSCULAR | Status: DC | PRN

## 2018-10-23 MED ORDER — SODIUM CHLORIDE 0.9 % IV SOLN
1.0000 g | Freq: Once | INTRAVENOUS | Status: AC
  Administered 2018-10-23: 1 g via INTRAVENOUS

## 2018-10-23 MED ORDER — SODIUM CHLORIDE 0.9 % IV SOLN
10.0000 mL/h | Freq: Once | INTRAVENOUS | Status: AC
  Administered 2018-10-23: 10 mL/h via INTRAVENOUS

## 2018-10-23 MED ORDER — ETOMIDATE 2 MG/ML IV SOLN
20.0000 mg | Freq: Once | INTRAVENOUS | Status: AC
  Administered 2018-10-23: 20 mg via INTRAVENOUS

## 2018-10-23 MED ORDER — LACTATED RINGERS IV SOLN
INTRAVENOUS | Status: DC
  Administered 2018-10-23: 14:00:00 via INTRAVENOUS

## 2018-10-23 MED ORDER — ACETAMINOPHEN 500 MG PO TABS: 1000.0000 mg | ORAL_TABLET | Freq: Once | ORAL | Status: DC

## 2018-10-23 MED ORDER — POLYETHYLENE GLYCOL 3350 17 G PO PACK: 17.0000 g | PACK | Freq: Every day | ORAL | Status: DC | PRN

## 2018-10-23 MED ORDER — SODIUM CHLORIDE 0.9 % IV BOLUS
500.0000 mL | Freq: Once | INTRAVENOUS | Status: AC
  Administered 2018-10-23: 500 mL via INTRAVENOUS

## 2018-10-23 MED ORDER — SODIUM CHLORIDE 0.9 % IV SOLN
8.0000 mg/h | INTRAVENOUS | Status: DC
  Administered 2018-10-23 – 2018-10-25 (×5): 8 mg/h via INTRAVENOUS
  Filled 2018-10-23 (×5): qty 80

## 2018-10-23 MED ORDER — OCTREOTIDE LOAD VIA INFUSION
50.0000 ug | Freq: Once | INTRAVENOUS | Status: AC
  Administered 2018-10-23: 50 ug via INTRAVENOUS
  Filled 2018-10-23: qty 25

## 2018-10-23 MED ORDER — SODIUM CHLORIDE 0.9 % IV SOLN: INTRAVENOUS | Status: DC

## 2018-10-23 MED ORDER — PROPOFOL 1000 MG/100ML IV EMUL
0.0000 ug/kg/min | INTRAVENOUS | Status: DC
  Administered 2018-10-23: 25 ug/kg/min via INTRAVENOUS
  Administered 2018-10-23: 20 ug/kg/min via INTRAVENOUS
  Administered 2018-10-24: 10 ug/kg/min via INTRAVENOUS
  Filled 2018-10-23 (×3): qty 100

## 2018-10-23 MED ORDER — ONDANSETRON HCL 4 MG PO TABS: 4.0000 mg | ORAL_TABLET | Freq: Four times a day (QID) | ORAL | Status: DC | PRN

## 2018-10-23 MED ORDER — INSULIN ASPART 100 UNIT/ML ~~LOC~~ SOLN
0.0000 [IU] | SUBCUTANEOUS | Status: DC
  Administered 2018-10-23 (×2): 3 [IU] via SUBCUTANEOUS
  Administered 2018-10-24: 2 [IU] via SUBCUTANEOUS
  Filled 2018-10-23 (×4): qty 1

## 2018-10-23 MED ORDER — PANTOPRAZOLE SODIUM 40 MG IV SOLR: 40.0000 mg | Freq: Two times a day (BID) | INTRAVENOUS | Status: DC

## 2018-10-23 MED ORDER — ROCURONIUM BROMIDE 50 MG/5ML IV SOLN
50.0000 mg | Freq: Once | INTRAVENOUS | Status: AC
  Administered 2018-10-23: 50 mg via INTRAVENOUS

## 2018-10-23 MED ORDER — FENTANYL CITRATE (PF) 100 MCG/2ML IJ SOLN
100.0000 ug | Freq: Once | INTRAMUSCULAR | Status: AC
  Administered 2018-10-23: 100 ug via INTRAVENOUS

## 2018-10-23 MED ORDER — SODIUM CHLORIDE 0.9 % IV SOLN
50.0000 ug/h | INTRAVENOUS | Status: DC
  Administered 2018-10-23 – 2018-10-25 (×4): 50 ug/h via INTRAVENOUS
  Filled 2018-10-23 (×9): qty 1

## 2018-10-23 MED ORDER — ETOMIDATE 2 MG/ML IV SOLN
INTRAVENOUS | Status: AC
  Administered 2018-10-23: 20 mg via INTRAVENOUS
  Filled 2018-10-23: qty 10

## 2018-10-23 MED ORDER — ALPRAZOLAM 0.5 MG PO TABS: 0.5000 mg | ORAL_TABLET | Freq: Every evening | ORAL | Status: DC | PRN

## 2018-10-23 MED ORDER — FENTANYL CITRATE (PF) 100 MCG/2ML IJ SOLN
INTRAMUSCULAR | Status: AC
  Administered 2018-10-23: 100 ug via INTRAVENOUS
  Filled 2018-10-23: qty 2

## 2018-10-23 MED ORDER — ORAL CARE MOUTH RINSE
15.0000 mL | OROMUCOSAL | Status: DC
  Administered 2018-10-23 – 2018-10-24 (×7): 15 mL via OROMUCOSAL

## 2018-10-23 MED ORDER — CHLORHEXIDINE GLUCONATE 0.12% ORAL RINSE (MEDLINE KIT)
15.0000 mL | Freq: Two times a day (BID) | OROMUCOSAL | Status: DC
  Administered 2018-10-23 – 2018-10-24 (×2): 15 mL via OROMUCOSAL

## 2018-10-23 NOTE — Progress Notes (Signed)
ET tube withdrawn 2 cm to 22 cm mark

## 2018-10-23 NOTE — H&P (Addendum)
[facility] [facility] [place] NAME: [name] Day    MR#:  [id number]  DATE OF BIRTH:  [birth year]  DATE OF ADMISSION:  10/23/2018  PRIMARY CARE PHYSICIAN: Idelle Crouch, MD   REQUESTING/REFERRING PHYSICIAN: Dr. Alfred Levins  CHIEF COMPLAINT:   Came with frequent falls since yesterday. History is obtained from patient's family-mainly daughter. HISTORY OF PRESENT ILLNESS:  [name] Day  is a 78 y.o. male with a known history of nonalcoholic cirrhosis of liver, bipolar disorder, diabetes, dementia, hypertension, issues with memory, renal mass (not pursuing workup), history of a fib on oral anticoagulation, history of AV block status post pacemaker and sleep apnea comes to the emergency room due to frequent falls at home last night. Daughter said he felt about a week ago when he tripped over shoes. Last night had three falls without any trauma. He is been complaining of right lower back pain from fall.  Patient has been having [name] Day unsure how long has been going on. He has had one episode of G.I. bleed in the past.  During my evaluation patient started vomiting blood. His hemoglobin 2 1/2 weeks ago was 11.4----hemoglobin today 7.7  patient is started on IV October tied drip, Protonix drip and getting emergent 2 units of blood transfusion  He is being admitted for G.I. bleed. Patient is being admitted to the ICU  I have spoken with ICU attending Dr. Jamal Collin and G.I. Dr. Allen Norris.  PAST MEDICAL HISTORY:   Past Medical History:  Diagnosis Date  . Able to transfer from wheelchair to chair    per family  . Anemia   . Bipolar 1 disorder ([place])   . Cancer ([place])   . Carotid artery occlusion   . Cirrhosis ([place])   . Dementia ([place])   . Diabetes mellitus without complication (HCC)    diet controlled  . Diabetic peripheral neuropathy ([place]) 03/03/2018  . Gait abnormality 02/12/2017  . Hypertension   . Left-sided Bell's palsy 03/03/2018  . Memory  disorder 02/12/2017  . Presence of permanent cardiac pacemaker 04/25/2016   Medtronic Advisa Dr, [id number], Serial #: [id number]  placed at [facility]  . Right renal mass   . Sleep apnea    non compliant with CPAP  . Stroke ([place]) 04/2018   left sided weakness  . TIA (transient ischemic attack)   . Wears dentures    partials    PAST SURGICAL HISTOIRY:   Past Surgical History:  Procedure Laterality Date  . CATARACT EXTRACTION W/PHACO Left 09/15/2018   Procedure: CATARACT EXTRACTION PHACO AND INTRAOCULAR LENS PLACEMENT ([place])  COMPLICATED LEFT DIABETIC;  Surgeon: Leandrew Koyanagi, MD;  Location: [place];  Service: Ophthalmology;  Laterality: Left;  Diabetic - diet controlled sleep apnea  . COLONOSCOPY WITH PROPOFOL N/A 02/25/2017   Procedure: COLONOSCOPY WITH PROPOFOL;  Surgeon: Manya Silvas, MD;  Location: [facility] ENDOSCOPY;  Service: Endoscopy;  Laterality: N/A;  . ESOPHAGOGASTRODUODENOSCOPY (EGD) WITH PROPOFOL N/A 02/25/2017   Procedure: ESOPHAGOGASTRODUODENOSCOPY (EGD) WITH PROPOFOL;  Surgeon: Manya Silvas, MD;  Location: [facility] ENDOSCOPY;  Service: Endoscopy;  Laterality: N/A;  . NASAL SINUS SURGERY    . PACEMAKER PLACEMENT    . TONSILLECTOMY    . TONSILLECTOMY      SOCIAL HISTORY:   Social History   Tobacco Use  . Smoking status: Former Smoker    Packs/day: 1.00    Types: Cigarettes, Cigars    Last attempt to quit: 1985    Years since quitting: 35.1  .  Smokeless tobacco: Never Used  Substance Use Topics  . Alcohol use: Not Currently    Comment: occasional    FAMILY HISTORY:   Family History  Problem Relation Age of Onset  . Allergies Mother   . Diabetes Father   . Diabetes Sister     DRUG ALLERGIES:   Allergies  Allergen Reactions  . Lisinopril Other (See Comments)    REACTION: Cough  . Penicillins Other (See Comments)    Reaction: unknown childhood reaction Has patient had a PCN reaction causing immediate rash, facial/tongue/throat swelling,  SOB or lightheadedness with hypotension: Unknown Has patient had a PCN reaction causing severe rash involving mucus membranes or skin necrosis: Unknown Has patient had a PCN reaction that required hospitalization: Unknown Has patient had a PCN reaction occurring within the last 10 years: Unknown If all of the above answers are "NO", then may proceed with Cephalosporin use.   . Clindamycin/Lincomycin Rash and Other (See Comments)    "turns me purple" "turns me orange"    REVIEW OF SYSTEMS:  Review of Systems  Constitutional: Negative for chills, fever and weight loss.  HENT: Negative for ear discharge, ear pain and nosebleeds.   Eyes: Negative for blurred vision, pain and discharge.  Respiratory: Negative for sputum production, shortness of breath, wheezing and stridor.   Cardiovascular: Negative for chest pain, palpitations, orthopnea and PND.  Gastrointestinal: Positive for melena. Negative for abdominal pain, diarrhea, nausea and vomiting.  Genitourinary: Negative for frequency and urgency.  Musculoskeletal: Positive for back pain, falls and joint pain.  Neurological: Positive for weakness. Negative for sensory change, speech change and focal weakness.  Psychiatric/Behavioral: Negative for depression and hallucinations. The patient is not nervous/anxious.      MEDICATIONS AT HOME:   Prior to Admission medications   Medication Sig Start Date End Date Taking? Authorizing Provider  ALPRAZolam Duanne Moron) 0.5 MG tablet Take 0.5 mg by mouth at bedtime as needed for anxiety.   Yes [provider]  apixaban (ELIQUIS) 5 MG TABS tablet Take 5 mg by mouth 2 (two) times daily.    Yes [provider]  FREESTYLE LITE test strip 1 each by Other route as directed. 05/12/18  Yes Medina-Vargas, Monina C, NP  gabapentin (NEURONTIN) 100 MG capsule Take 2 capsules (200 mg total) by mouth 3 (three) times daily. Patient taking differently: Take 100 mg by mouth 2 (two) times daily.  05/12/18  05/12/19 Yes Medina-Vargas, Monina C, NP  lactulose (CHRONULAC) 10 GM/15ML solution Take 15 mLs (10 g total) by mouth daily. Patient taking differently: Take 10 g by mouth 2 (two) times daily.  06/02/18  Yes Salary, Avel Peace, MD  lamoTRIgine (LAMICTAL) 25 MG tablet Take 2 tablets (50 mg total) by mouth 2 (two) times daily. Patient taking differently: Take 60 mg by mouth at bedtime.  05/12/18  Yes Medina-Vargas, Monina C, NP  Lurasidone HCl (LATUDA) 60 MG TABS Take 1 tablet (60 mg total) by mouth daily. Patient taking differently: Take 30 mg by mouth at bedtime.  05/12/18  Yes Medina-Vargas, Monina C, NP  metFORMIN (GLUCOPHAGE) 500 MG tablet Take 500 mg by mouth daily as needed (high blood sugar).   Yes [provider]  QUEtiapine (SEROQUEL) 25 MG tablet Take 25 mg by mouth at bedtime.   Yes [provider]  tamsulosin (FLOMAX) 0.4 MG CAPS capsule Take 1 capsule (0.4 mg total) by mouth 2 (two) times daily. Patient taking differently: Take 0.4 mg by mouth daily.  08/03/18  Yes Sninsky,  Herbert Seta, MD  XIFAXAN 550 MG TABS tablet Take 550 mg by mouth every evening.  07/22/18  Yes [provider]  allopurinol (ZYLOPRIM) 300 MG tablet Take 1 tablet (300 mg total) by mouth daily as needed (gout). 05/12/18 05/12/19  Medina-Vargas, Monina C, NP  lidocaine (LIDODERM) 5 % Place 1 patch onto the skin daily. Remove & Discard patch within 12 hours or as directed by MD 06/01/18   Salary, Avel Peace, MD  liver oil-zinc oxide (DESITIN) 40 % ointment Apply topically 3 (three) times daily as needed for irritation. 06/01/18   Salary, Avel Peace, MD  NON FORMULARY Diet Type: NCS    [provider]  omeprazole (PRILOSEC) 40 MG capsule Take 1 capsule (40 mg total) by mouth daily. Patient not taking: Reported on 09/08/2018 05/12/18   Medina-Vargas, Monina C, NP      VITAL SIGNS:  Blood pressure (!) 123/46, pulse (!) 103, temperature 98.4 F (36.9 C), temperature source Oral, resp. rate (!) 185,  height 5\' 10"  (1.778 m), weight 101.6 kg, SpO2 99 %.  PHYSICAL EXAMINATION:  GENERAL:  78 y.o.-year-old patient lying in the bed with moderate acute distress.  EYES: Pupils equal, round, reactive to light and accommodation. ++ scleral icterus. Extraocular muscles intact.  HEENT: Head atraumatic, normocephalic. Oropharynx  dry mucosa NECK:  Supple, no jugular venous distention. No thyroid enlargement, no tenderness.  LUNGS: Normal breath sounds bilaterally, no wheezing, rales,rhonchi or crepitation. No use of accessory muscles of respiration.  CARDIOVASCULAR: S1, S2 normal. No murmurs, rubs, or gallops.  ABDOMEN: Soft, nontender, ++distended. Bowel sounds present. No organomegaly or mass.  EXTREMITIES: No pedal edema, cyanosis, or clubbing.  NEUROLOGIC: nonfocal. Limited exam since patient is actively throwing of blood.  PSYCHIATRIC: The patient is alert and oriented x 3.  SKIN: No obvious rash, lesion, or ulcer.   LABORATORY PANEL:   CBC Recent Labs  Lab 10/23/18 1027  WBC 6.2  HGB 7.7*  HCT 24.6*  PLT 142*   ------------------------------------------------------------------------------------------------------------------  Chemistries  Recent Labs  Lab 10/23/18 1027  NA 138  K 5.3*  CL 106  CO2 20*  GLUCOSE 210*  BUN 50*  CREATININE 0.91  CALCIUM 8.7*  AST 38  ALT 17  ALKPHOS 64  BILITOT 2.9*   ------------------------------------------------------------------------------------------------------------------  Cardiac Enzymes Recent Labs  Lab 10/23/18 1144  TROPONINI <0.03   ------------------------------------------------------------------------------------------------------------------  RADIOLOGY:  No results found.  EKG:    IMPRESSION AND PLAN:   [name] Day  is a 78 y.o. male with a known history of nonalcoholic cirrhosis of liver, bipolar disorder, diabetes, dementia, hypertension, issues with memory, renal mass (not pursuing workup), history of  a fib on oral anticoagulation, history of AV block status post pacemaker and sleep apnea comes to the emergency room due to frequent falls at home last night.   1. G.I. bleed/hematemesis in the setting of nonalcoholic cirrhosis of liver suspect variceal bleed versus rule out peptic ulcer disease -admit patient to ICU -IV fluids -IV Protonix drip, IV Octreotide drip -transfuse one unit of blood transfusion, hemoglobin Q8 hours transfuse as needed -spoke with Dr. Allen Norris to see patient -His hemoglobin 2 1/2 weeks ago was 11.4--hemoglobin today 7.7--1 unit PRBC--hgb   2. Chronic atrial fibrillation on eliquis -hold oral anticoagulation  3. Nonalcoholic cirrhosis of liver -this was diagnosed about 3 to 6 months ago -follows with Dr. Ricky Stabs outpatient -patient has chronically elevated bilirubin and chronically elevated ammonia -continue lactulose when able to  4. Frequent falls -physical therapy to  see patient after G.I. bleed results  5. Type II diabetes -sliding scale insulin  6. Bipolar disorder -since patient is NPO right now am holding home meds   7. DVT prophylaxis SCD's  Patient is critically ill. Discussed with patient's family in the ER G.I. and ICU attending aware  All the records are reviewed and case discussed with ED provider.   CODE STATUS: DNR  TOTAL TIME TAKING CARE OF THIS PATIENT: **55* minutes.    Fritzi Mandes M.D on 10/23/2018 at 12:32 PM  Between 7am to 6pm - Pager - [phone]  After 6pm go to [email or web address] - password [email or web address] [facility]  [facility]  Office  [phone]  CC: Primary care physician; Idelle Crouch, MD

## 2018-10-23 NOTE — Consult Note (Signed)
[name] Lame, MD [facility]  [address] [place], [place] [address] Phone: [phone] Fax : [phone]  Consultation  Referring Provider:     Dr. Posey Pronto Primary Care Physician:  Idelle Crouch, MD Primary Gastroenterologist:  Dr. Vira Agar Reason for Consultation:     Hematemesis  Date of Admission:  10/23/2018 Date of Consultation:  10/23/2018         HPI:   [name] Day is a 78 y.o. male who has a history of nonalcoholic fatty liver disease who had a fall with injury to his ribs about a week ago.  The patient has been becoming more more confused over the last week and has been taking Naprosyn for his pain.  The patient was brought to the emergency room because of his continued falling and had a large amount of hematemesis.  The patient had a hemoglobin of 11.42 weeks ago and now it is come down to 7.7.  The patient was started on octreotide drip in the emergency room and I was called while the patient was being transferred to the ICU.  The patient has a history of A. fib and had a finding of a encapsulated renal mass that the family reports he is supposed to see somebody at [facility] about.   Past Medical History:  Diagnosis Date  . Able to transfer from wheelchair to chair    per family  . Anemia   . Bipolar 1 disorder ([place])   . Cancer ([place])   . Carotid artery occlusion   . Cirrhosis ([place])   . Dementia ([place])   . Diabetes mellitus without complication (HCC)    diet controlled  . Diabetic peripheral neuropathy ([place]) 03/03/2018  . Gait abnormality 02/12/2017  . Hypertension   . Left-sided Bell's palsy 03/03/2018  . Memory disorder 02/12/2017  . 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 Right renal mass   . Sleep apnea    non compliant with CPAP  . Stroke ([place]) 04/2018   left sided weakness  . TIA (transient ischemic attack)   . Wears dentures    partials    Past Surgical History:    Procedure Laterality Date  . CATARACT EXTRACTION W/PHACO Left 09/15/2018   Procedure: CATARACT EXTRACTION PHACO AND INTRAOCULAR LENS PLACEMENT ([place])  COMPLICATED LEFT DIABETIC;  Surgeon: Leandrew Koyanagi, MD;  Location: [facility];  Service: Ophthalmology;  Laterality: Left;  Diabetic - diet controlled sleep apnea  . COLONOSCOPY WITH PROPOFOL N/A 02/25/2017   Procedure: COLONOSCOPY WITH PROPOFOL;  Surgeon: Manya Silvas, MD;  Location[facility] ENDOSCOPY;  Service: Endoscopy;  Laterality: N/A;  . ESOPHAGOGASTRODUODENOSCOPY (EGD) WITH PROPOFOL N/A 02/25/2017   Procedure: ESOPHAGOGASTRODUODENOSCOPY (EGD) WITH PROPOFOL;  Surgeon: Manya Silvas, MD;  Location: [facility] ENDOSCOPY;  Service: Endoscopy;  Laterality: N/A;  . NASAL SINUS SURGERY    . PACEMAKER PLACEMENT    . TONSILLECTOMY    . TONSILLECTOMY      Prior to Admission medications   Medication Sig Start Date End Date Taking? Authorizing Provider  ALPRAZolam Duanne Moron) 0.5 MG tablet Take 0.5 mg by mouth at bedtime as needed for anxiety.   Yes [provider]  apixaban (ELIQUIS) 5 MG TABS tablet Take 5 mg by mouth 2 (two) times daily.    Yes [provider]  FREESTYLE LITE test strip 1 each by Other route as directed. 05/12/18  Yes Medina-Vargas, Senaida Lange, NP  gabapentin (NEURONTIN) 100 MG capsule Take 2 capsules (200 mg total) by mouth 3 (three) times daily. Patient taking differently: Take 100 mg by mouth 2 (two) times daily.  05/12/18 05/12/19 Yes Medina-Vargas, Monina C, NP  lactulose (CHRONULAC) 10 GM/15ML solution Take 15 mLs (10 g total) by mouth daily. Patient taking differently: Take 10 g by mouth 2 (two) times daily.  06/02/18  Yes Salary, Avel Peace, MD  lamoTRIgine (LAMICTAL) 25 MG tablet Take 2 tablets (50 mg total) by mouth 2 (two) times daily. Patient taking differently: Take 60 mg by mouth at bedtime.  05/12/18  Yes Medina-Vargas, Monina C, NP  Lurasidone HCl (LATUDA) 60 MG TABS Take 1 tablet (60 mg  total) by mouth daily. Patient taking differently: Take 30 mg by mouth at bedtime.  05/12/18  Yes Medina-Vargas, Monina C, NP  metFORMIN (GLUCOPHAGE) 500 MG tablet Take 500 mg by mouth daily as needed (high blood sugar).   Yes [provider]  QUEtiapine (SEROQUEL) 25 MG tablet Take 25 mg by mouth at bedtime.   Yes [provider]  tamsulosin (FLOMAX) 0.4 MG CAPS capsule Take 1 capsule (0.4 mg total) by mouth 2 (two) times daily. Patient taking differently: Take 0.4 mg by mouth daily.  08/03/18  Yes Billey Co, MD  XIFAXAN 550 MG TABS tablet Take 550 mg by mouth every evening.  07/22/18  Yes [provider]  allopurinol (ZYLOPRIM) 300 MG tablet Take 1 tablet (300 mg total) by mouth daily as needed (gout). 05/12/18 05/12/19  Medina-Vargas, Monina C, NP  lidocaine (LIDODERM) 5 % Place 1 patch onto the skin daily. Remove & Discard patch within 12 hours or as directed by MD 06/01/18   Salary, Avel Peace, MD  liver oil-zinc oxide (DESITIN) 40 % ointment Apply topically 3 (three) times daily as needed for irritation. 06/01/18   Salary, Avel Peace, MD  NON FORMULARY Diet Type: NCS    [provider]  omeprazole (PRILOSEC) 40 MG capsule Take 1 capsule (40 mg total) by mouth daily. Patient not taking: Reported on 09/08/2018 05/12/18   Medina-Vargas, Senaida Lange, NP    Family History  Problem Relation Age of Onset  . Allergies Mother   . Diabetes Father   . Diabetes Sister      Social History   Tobacco Use  . Smoking status: Former Smoker    Packs/day: 1.00    Types: Cigarettes, Cigars    Last attempt to quit: 1985    Years since quitting: 35.1  . Smokeless tobacco: Never Used  Substance Use Topics  . Alcohol use: Not Currently    Comment: occasional  . Drug use: No    Allergies as of 10/23/2018 - Review Complete 10/23/2018  Allergen Reaction Noted  . Lisinopril Other (See Comments)   . Penicillins Other (See Comments)   . Clindamycin/lincomycin Rash and  Other (See Comments) 04/24/2016    Review of Systems:    All systems reviewed and negative except where noted in HPI.   Physical Exam:  Vital signs in last 24 hours: Temp:  [98.3 F (36.8 C)-98.8 F (37.1 C)] 98.8 F (37.1 C) (02/22 1236) Pulse Rate:  [66-103] 66 (02/22 1400) Resp:  [16-85] 20 (02/22 1400) BP: (115-127)/(46-62) 127/62 (02/22 1236) SpO2:  [99 %] 99 % (02/22 1236) Weight:  [101.6 kg] 101.6 kg (02/22 1033)   General:   Pleasant, cooperative in NAD Head:  Normocephalic and atraumatic. Eyes:   No icterus.   Conjunctiva pink. PERRLA. Ears:  Normal auditory acuity. Neck:  Supple; no masses or thyroidomegaly Lungs: Respirations even and unlabored. Lungs clear to auscultation bilaterally.   No wheezes, crackles, or rhonchi.  Heart:  Regular rate and rhythm;  Without murmur, clicks, rubs or gallops Abdomen:  Soft, nondistended, nontender. Normal bowel sounds. No appreciable masses or hepatomegaly.  No rebound or guarding.  Rectal:  Not performed. Msk:  Symmetrical without gross deformities.    Extremities:  Without edema, cyanosis or clubbing. Neurologic:  Alert and oriented x3;  grossly normal neurologically. Skin:  Intact without significant lesions or rashes. Pale. Cervical Nodes:  No significant cervical adenopathy. Psych:  Alert and cooperative. Normal affect.  LAB RESULTS: Recent Labs    10/23/18 1027 10/23/18 1416  WBC 6.2  --   HGB 7.7* 7.7*  HCT 24.6*  --   PLT 142*  --    BMET Recent Labs    10/23/18 1027  NA 138  K 5.3*  CL 106  CO2 20*  GLUCOSE 210*  BUN 50*  CREATININE 0.91  CALCIUM 8.7*   LFT Recent Labs    10/23/18 1027  PROT 5.5*  ALBUMIN 2.9*  AST 38  ALT 17  ALKPHOS 64  BILITOT 2.9*   PT/INR Recent Labs    10/23/18 1144  LABPROT 24.5*  INR 2.24    STUDIES: Ct Head Wo Contrast  Result Date: 10/23/2018 CLINICAL DATA:  Patient fell 3 times yesterday. EXAM: CT HEAD WITHOUT CONTRAST CT CERVICAL SPINE WITHOUT CONTRAST  TECHNIQUE: Multidetector CT imaging of the head and cervical spine was performed following the standard protocol without intravenous contrast. Multiplanar CT image reconstructions of the cervical spine were also generated. COMPARISON:  Head CT 07/31/2018. FINDINGS: CT HEAD FINDINGS Brain: There is no evidence for acute hemorrhage, hydrocephalus, mass lesion, or abnormal extra-axial fluid collection. No definite CT evidence for acute infarction. Diffuse loss of parenchymal volume is consistent with atrophy. Patchy low attenuation in the deep hemispheric and periventricular white matter is nonspecific, but likely reflects chronic microvascular ischemic demyelination. 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 Vascular: No hyperdense vessel or unexpected calcification. Skull: No evidence for fracture. No worrisome lytic or sclerotic lesion. Sinuses/Orbits: Chronic opacification of the right maxillary sinus and right mastoid air cells. Opacification of the right sphenoid sinus is new since prior study. Other: None. CT CERVICAL SPINE FINDINGS Alignment: Straightening of normal cervical lordosis evident. Trace anterolisthesis of C3 on 4 likely related to associated facet disease. Skull base and vertebrae: No acute fracture. No primary bone lesion or focal pathologic process. Soft tissues and spinal canal: No prevertebral fluid or swelling. No visible canal hematoma. Disc levels: Loss of disc height with endplate degeneration evident at C5-6 and C6-7. Upper cervical facet degeneration noted bilaterally. Upper chest: Negative. Other: None. IMPRESSION: 1. No acute intracranial abnormality. 2. Atrophy with chronic small vessel white matter ischemic disease. 3. Chronic right maxillary and ethmoid sinus disease. Opacification of the right sphenoid sinus is new since prior study. 4. Degenerative changes in the cervical spine without fracture. 5. Loss of cervical lordosis. This can be related to patient  positioning, muscle spasm or soft tissue injury. Electronically Signed   By: Misty Stanley M.D.   On: 10/23/2018 13:23   Ct Cervical Spine Wo Contrast  Result Date: 10/23/2018 CLINICAL DATA:  Patient fell 3 times yesterday. EXAM: CT HEAD WITHOUT CONTRAST CT CERVICAL SPINE WITHOUT CONTRAST TECHNIQUE: Multidetector CT imaging of the head and cervical spine was performed following the standard protocol without  intravenous contrast. Multiplanar CT image reconstructions of the cervical spine were also generated. COMPARISON:  Head CT 07/31/2018. FINDINGS: CT HEAD FINDINGS Brain: There is no evidence for acute hemorrhage, hydrocephalus, mass lesion, or abnormal extra-axial fluid collection. No definite CT evidence for acute infarction. Diffuse loss of parenchymal volume is consistent with atrophy. Patchy low attenuation in the deep hemispheric and periventricular white matter is nonspecific, but likely reflects chronic microvascular ischemic demyelination. 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 Vascular: No hyperdense vessel or unexpected calcification. Skull: No evidence for fracture. No worrisome lytic or sclerotic lesion. Sinuses/Orbits: Chronic opacification of the right maxillary sinus and right mastoid air cells. Opacification of the right sphenoid sinus is new since prior study. Other: None. CT CERVICAL SPINE FINDINGS Alignment: Straightening of normal cervical lordosis evident. Trace anterolisthesis of C3 on 4 likely related to associated facet disease. Skull base and vertebrae: No acute fracture. No primary bone lesion or focal pathologic process. Soft tissues and spinal canal: No prevertebral fluid or swelling. No visible canal hematoma. Disc levels: Loss of disc height with endplate degeneration evident at C5-6 and C6-7. Upper cervical facet degeneration noted bilaterally. Upper chest: Negative. Other: None. IMPRESSION: 1. No acute intracranial abnormality. 2. Atrophy with chronic small  vessel white matter ischemic disease. 3. Chronic right maxillary and ethmoid sinus disease. Opacification of the right sphenoid sinus is new since prior study. 4. Degenerative changes in the cervical spine without fracture. 5. Loss of cervical lordosis. This can be related to patient positioning, muscle spasm or soft tissue injury. Electronically Signed   By: Misty Stanley M.D.   On: 10/23/2018 13:23      Impression / Plan:   Assessment: Active Problems:   GI bleed   TIAN MCMURTREY is a 78 y.o. y/o male with history of liver cirrhosis and came in with GI bleeding after taking anti-inflammatory medications.  Plan: . The patient will be set up for a emergent EGD to look for the source of bleeding.  The patient and his family have been explained that this could be peptic ulcer disease do to the NSAIDs or bleeding from esophageal varices.  The patient and family have been explained the plan and agree with it.  Thank you for involving me in the care of this patient.      LOS: 0 days   [name] Lame, MD  10/23/2018, 2:47 PM    Note: This dictation was prepared with Dragon dictation along with smaller phrase technology. Any transcriptional errors that result from this process are unintentional.

## 2018-10-23 NOTE — Op Note (Addendum)
[facility] Gastroenterology Patient Name: [name] Day Procedure Date: 10/23/2018 2:29 PM MRN: [id number] Account #: [id number] Date of Birth: 07-Mar-1941 Admit Type: Inpatient Age: 78 Room: CCU 35 Gender: Male Note Status: Finalized Procedure:            Upper GI endoscopy Indications:          Hematemesis Providers:            [name] MD, MD Referring MD:         Leonie Douglas. Doy Hutching, MD (Referring MD) Medicines:            General Anesthesia Complications:        No immediate complications. Procedure:            Pre-Anesthesia Assessment:                       - Prior to the procedure, a History and Physical was                        performed, and patient medications and allergies were                        reviewed. The patient's tolerance of previous                        anesthesia was also reviewed. The risks and benefits of                        the procedure and the sedation options and risks were                        discussed with the patient. All questions were                        answered, and informed consent was obtained. Prior                        Anticoagulants: The patient has taken no previous                        anticoagulant or antiplatelet agents. ASA Grade                        Assessment: IV - A patient with severe systemic disease                        that is a constant threat to life. After reviewing the                        risks and benefits, the patient was deemed in                        satisfactory condition to undergo the procedure.                       After obtaining informed consent, the endoscope was                        passed under direct vision. Throughout the procedure,  the patient's blood pressure, pulse, and oxygen                        saturations were monitored continuously. The Endoscope                        was introduced through the mouth, and advanced to the                        second part of duodenum. The upper GI endoscopy was                        accomplished without difficulty. The patient tolerated                        the procedure well. Findings:      Grade III varices were found in the lower third of the esophagus. Three       bands were successfully placed with incomplete eradication of varices.       There was no bleeding at the end of the maneuver.      Hematin (altered blood/coffee-ground-like material) was found in the       entire examined stomach.      The examined duodenum was normal. Impression:           - Grade III esophageal varices. Incompletely                        eradicated. Banded.                       - Hematin (altered blood/coffee-ground-like material)                        in the entire stomach.                       - Normal examined duodenum.                       - No specimens collected. Recommendation:       - NPO.                       - Continue present medications. Procedure Code(s):    --- Professional ---                       [phone], Esophagogastroduodenoscopy, flexible, transoral;                        with band ligation of esophageal/gastric varices Diagnosis Code(s):    --- Professional ---                       I85.00, Esophageal varices without bleeding                       K92.2, Gastrointestinal hemorrhage, unspecified                       K92.0, Hematemesis CPT copyright 2018 American Medical Association. All rights reserved. The codes documented in this report are preliminary and upon coder review may  be revised to meet current compliance requirements. Ilynn Stauffer [employer]  MD, MD 10/23/2018 3:11:40 PM This report has been signed electronically. Number of Addenda: 0 Note Initiated On: 10/23/2018 2:29 PM      [facility]

## 2018-10-23 NOTE — OR Nursing (Signed)
PT INTUBATED BY DR Naaman Plummer IN ROOM BEFORE START OF PROCEDURE. Egd began and pt. Was suctioned continously due to dark red and med red blood in esophagus . There were 3 BANDS Placed by DR Allen Norris.pROCEDURE COMPLETED AND PACU NURSE CONTINUED TO CARE FOR PT.

## 2018-10-23 NOTE — ED Triage Notes (Signed)
First Nurse Note:  Fall yesterday.  C/O rib pain.  Referred to ED for evaluation of possible urinary retention.  Family states  Patient has been voiding only once daily for small amounts.  Patient has history of urinary retention and cirrhosis.  Patient is AAOx3.  Skin pale, warm and dry. NAD

## 2018-10-23 NOTE — ED Notes (Signed)
Patel at bedside. Pt began vomiting coffee grind like substance. Pt is transferred to CT at this time.

## 2018-10-23 NOTE — ED Triage Notes (Signed)
Fell x 3 yesterday. Decreased urine output. History elevated ammonia related to cirrhosis.

## 2018-10-23 NOTE — Consult Note (Addendum)
PCCM CONSULT NOTE  Requesting MD/Service: Allen Norris Date of initial consultation: 10/23/18 Reason for consultation: UGIB, need for intubation prior to EGD  PT PROFILE: 78 y.o. male with history of nonalcoholic fatty liver disease/cirrhosis presented to [facility] ED 2/22 with altered mental status and hematemesis.  Noted to be taking nonsteroidal anti-inflammatory medications for thoracic pain after a recent fall.  MAJOR EVENTS/TEST RESULTS: 02/22 admission to ICU via ED with hematemesis 02/22 intubated for EGD.  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 Three bands were successfully placed with incomplete eradication of varices. There was no bleeding at the end of the maneuver. Hematin (altered blood/coffee-ground-like material) was found in the entire examined stomach  INDWELLING DEVICES:: ETT 02/22 >>   MICRO DATA: MRSA PCR >> NEG  ANTIMICROBIALS:  Ceftriaxone (SBP prophylaxis) 02/22 >>   24 INTERVAL:  HPI:  History of present illness is well-documented in emergency department notes and admission history and physical.  At the time of my evaluation, the patient was cognitively intact and denying dyspnea and pain.  Past Medical History:  Diagnosis Date  . Able to transfer from wheelchair to chair    per family  . Anemia   . Bipolar 1 disorder ([place])   . Cancer ([place])   . Carotid artery occlusion   . Cirrhosis ([place])   . Dementia ([place])   . Diabetes mellitus without complication (HCC)    diet controlled  . Diabetic peripheral neuropathy ([place]) 03/03/2018  . Gait abnormality 02/12/2017  . Hypertension   . Left-sided Bell's palsy 03/03/2018  . Memory disorder 02/12/2017  . Presence of permanent cardiac pacemaker 04/25/2016   Medtronic Advisa Dr, [id number], Serial #: [id number]  [facility] at [facility]  . Right renal mass   . Sleep apnea    non compliant with CPAP  . Stroke ([place]) 04/2018   left sided  weakness  . TIA (transient ischemic attack)   . Wears dentures    partials    Past Surgical History:  Procedure Laterality Date  . CATARACT EXTRACTION W/PHACO Left 09/15/2018   Procedure: CATARACT EXTRACTION PHACO AND INTRAOCULAR LENS PLACEMENT ([place])  COMPLICATED LEFT DIABETIC;  Surgeon: Leandrew Koyanagi, MD;  Location: [place];  Service: Ophthalmology;  Laterality: Left;  Diabetic - diet controlled sleep apnea  . COLONOSCOPY WITH PROPOFOL N/A 02/25/2017   Procedure: COLONOSCOPY WITH PROPOFOL;  Surgeon: Manya Silvas, MD;  Location: [facility] ENDOSCOPY;  Service: Endoscopy;  Laterality: N/A;  . ESOPHAGOGASTRODUODENOSCOPY (EGD) WITH PROPOFOL N/A 02/25/2017   Procedure: ESOPHAGOGASTRODUODENOSCOPY (EGD) WITH PROPOFOL;  Surgeon: Manya Silvas, MD;  Location: [facility] ENDOSCOPY;  Service: Endoscopy;  Laterality: N/A;  . NASAL SINUS SURGERY    . PACEMAKER PLACEMENT    . TONSILLECTOMY    . TONSILLECTOMY      MEDICATIONS: I have reviewed all medications and confirmed regimen as documented  Social History   Socioeconomic History  . Marital status: Widowed    Spouse name: Not on file  . Number of children: 2  . Years of education: 77  . Highest education level: Not on file  Occupational History  . Occupation: Retired  Scientific laboratory technician  . Financial resource strain: Not on file  . Food insecurity:    Worry: Never true    Inability: Never true  . Transportation needs:    Medical: No    Non-medical: No  Tobacco Use  . Smoking status: Former Smoker  Packs/day: 1.00    Types: Cigarettes, Cigars    Last attempt to quit: 1985    Years since quitting: 35.1  . Smokeless tobacco: Never Used  Substance and Sexual Activity  . Alcohol use: Not Currently    Comment: occasional  . Drug use: No  . Sexual activity: Not Currently  Lifestyle  . Physical activity:    Days per week: 1 day    Minutes per session: 10 min  . Stress: Not at all  Relationships  . Social connections:     Talks on phone: Patient refused    Gets together: Patient refused    Attends religious service: Patient refused    Active member of club or organization: Patient refused    Attends meetings of clubs or organizations: Patient refused    Relationship status: Patient refused  . Intimate partner violence:    Fear of current or ex partner: Patient refused    Emotionally abused: Patient refused    Physically abused: Patient refused    Forced sexual activity: Patient refused  Other Topics Concern  . Not on file  Social History Narrative   Lives with [name]   Caffeine use: caffeine free soda daily   Right handed    Family History  Problem Relation Age of Onset  . Allergies Mother   . Diabetes Father   . Diabetes Sister     ROS: No fever, myalgias/arthralgias, unexplained weight loss or weight gain No new focal weakness or sensory deficits No otalgia, hearing loss, visual changes, nasal and sinus symptoms, mouth and throat problems No neck pain or adenopathy No abdominal pain, N/V/D, diarrhea, change in bowel pattern No dysuria, change in urinary pattern   Vitals:   10/23/18 1530 10/23/18 1540 10/23/18 1550 10/23/18 1600  BP: (!) 87/45 (!) 102/48  (!) 105/43  Pulse: (!) 101 77 71 66  Resp: 19 19 18  (!) 22  Temp:      TempSrc:      SpO2: 100% 100% 100% 100%  Weight:      Height:       Vent Mode: PRVC FiO2 (%):  [40 %] 40 % Set Rate:  [18 bmp] 18 bmp Vt Set:  [500 mL] 500 mL PEEP:  [5 cmH20] 5 cmH20   EXAM:  Gen: WDWN, No overt respiratory distress HEENT: NCAT, sclera white, oropharynx normal Neck: Supple without LAN, thyromegaly, JVD Lungs: breath sounds full, percussion normal, adventitious sounds: None Cardiovascular: RRR, no murmurs noted Abdomen: Soft, nontender, normal BS Ext: without clubbing, cyanosis, edema Neuro: CNs grossly intact, motor and sensory intact Skin: Limited exam, no lesions noted  DATA:   BMP Latest Ref Rng & Units 10/23/2018  07/31/2018 06/16/2018  Glucose 70 - 99 mg/dL 210(H) 211(H) 166(H)  BUN 8 - 23 mg/dL 50(H) 18 14  Creatinine 0.61 - 1.24 mg/dL 0.91 0.78 0.91  Sodium 135 - 145 mmol/L 138 138 136  Potassium 3.5 - 5.1 mmol/L 5.3(H) 3.7 4.0  Chloride 98 - 111 mmol/L 106 104 99  CO2 22 - 32 mmol/L 20(L) 25 28  Calcium 8.9 - 10.3 mg/dL 8.7(L) 9.2 9.2    CBC Latest Ref Rng & Units 10/23/2018 10/23/2018 07/31/2018  WBC 4.0 - 10.5 K/uL - 6.2 4.2  Hemoglobin 13.0 - 17.0 g/dL 7.7(L) 7.7(L) 11.3(L)  Hematocrit 39.0 - 52.0 % - 24.6(L) 35.3(L)  Platelets 150 - 400 K/uL - 142(L) 105(L)    CXR: AICD present.  No acute cardiac or pulmonary findings  I have  personally reviewed all chest radiographs reported above including CXRs and CT chest unless otherwise indicated  IMPRESSION:   UGIB with hematemesis Acute blood loss anemia Fatty liver with cirrhosis History of OSA Type 2 diabetes without complication History of dementia History of bipolar disorder  PLAN:  Vent settings established Vent bundle implemented Daily SBT as indicated Pantoprazole and octreotide infusions per GI Empiric ceftriaxone for SBP prophylaxis DVT px: SCDs Monitor CBC intermittently Transfuse per usual guidelines. Transfusion threshold hemoglobin 7.0 or hypotension with active bleeding Twice daily protocol - propofol, intermittent fentanyl Daily wake up assessment and SBT SSI ordered  Merton Border, MD PCCM service Mobile [phone] Pager [phone] 10/23/2018 4:44 PM

## 2018-10-23 NOTE — ED Provider Notes (Signed)
[facility] Emergency Department Provider Note  ____________________________________________  Time seen: Approximately 12:03 PM  I have reviewed the triage vital signs and the nursing notes.   HISTORY  Chief Complaint Fall and Dysuria   HPI [name] Day is a 78 y.o. male history of cirrhosis of the liver, AV block status post pacemaker, stroke on Eliquis, dementia, anemia, sleep apnea who presents for evaluation of fall.  According to the daughter patient has had 3 falls since last night.  No LOC.  Patient denies having any head trauma.  He is complaining of right lower back pain from 1 of his falls.  According to the daughter patient however is refused to drink enough fluids on a daily basis.  They recently had to increase his lactulose due to elevated ammonia which was checked by his primary care doctor.  Daughter has also noticed some black stools but is unsure how long these have been ongoing.  He has had 1 prior episode of GI bleed in the past.  No vomiting or hematemesis, no abdominal pain, fever.  Daughter is concerned the patient may have a UTI since he has had significant decreased urine output over the last few days.  Patient denies dysuria or hematuria.  Patient denies headache, neck pain, back pain, or extremity pain.  Past Medical History:  Diagnosis Date  . Able to transfer from wheelchair to chair    per family  . Anemia   . Bipolar 1 disorder ([place])   . Cancer ([place])   . Carotid artery occlusion   . Cirrhosis ([place])   . Dementia ([place])   . Diabetes mellitus without complication (HCC)    diet controlled  . Diabetic peripheral neuropathy ([place]) 03/03/2018  . Gait abnormality 02/12/2017  . Hypertension   . Left-sided Bell's palsy 03/03/2018  . Memory disorder 02/12/2017  . 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 Right renal mass   . Sleep apnea    non compliant  with CPAP  . Stroke ([place]) 04/2018   left sided weakness  . TIA (transient ischemic attack)   . Wears dentures    partials    Patient Active Problem List   Diagnosis Date Noted  . Hyperammonemia ([place]) 06/28/2018  . Hepatic failure, unspecified without coma ([place]) 06/02/2018  . Anemia 05/26/2018  . Dyslipidemia associated with type 2 diabetes mellitus ([place]) 05/22/2018  . GERD without esophagitis 05/22/2018  . Somnolence 05/22/2018  . Bipolar 1 disorder ([place]) 05/12/2018  . Acute right hip pain 04/28/2018  . Frequent falls 04/24/2018  . Expressive aphasia 04/24/2018  . Left-sided Bell's palsy 03/03/2018  . Diabetic peripheral neuropathy ([place]) 03/03/2018  . Abnormal LFTs 09/08/2017  . 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 ED (erectile dysfunction) 09/08/2017  . Environmental allergies 09/08/2017  . Nephrolithiasis 09/08/2017  . Non-cardiac chest pain 09/08/2017  . Rectal bleeding 09/08/2017  . Sinusitis 09/08/2017  . Varicose veins of leg with swelling, bilateral 06/12/2017  . Venous ulcer ([place]) 05/18/2017  . Chronic venous insufficiency 05/18/2017  . Memory disorder 02/12/2017  . Gait abnormality 02/12/2017  . 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 Hyperlipidemia 02/06/2017  . Swelling of limb 02/06/2017  . Lymphedema 02/06/2017  . Heme positive stool 12/04/2016  . 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 Cardiac pacemaker in situ 04/27/2016  . AV heart block 04/24/2016  . Hyperglycemia 04/24/2016  .  Generalized weakness 04/24/2016  . Syncope and collapse 04/24/2016  . Metal bone fixation hardware in place 04/21/2016  . Bilateral carotid artery stenosis 02/05/2015  . OSA (obstructive sleep apnea) 02/05/2015  . Hyperlipidemia due to type 2 diabetes mellitus ([place]) 02/05/2015  . Benign essential hypertension 01/19/2015  . Hypertension associated with diabetes ([place]) 01/19/2015  . B12 deficiency 05/23/2014  . 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 Long-term insulin use ([place]) 05/22/2014  . Type II diabetes mellitus with manifestations, uncontrolled ([place]) 05/22/2014  . Aortic stenosis, moderate 11/14/2013  . ANEMIA-NOS 03/29/2007  . Anxiety 03/29/2007  . DEPRESSION 03/29/2007  . ALLERGIC RHINITIS 03/29/2007  . ASTHMA 03/29/2007  . Osteoarthritis 03/29/2007  . NEPHROLITHIASIS, HX OF 03/29/2007    Past Surgical History:  Procedure Laterality Date  . CATARACT EXTRACTION W/PHACO Left 09/15/2018   Procedure: CATARACT EXTRACTION PHACO AND INTRAOCULAR LENS PLACEMENT ([place])  COMPLICATED LEFT DIABETIC;  Surgeon: Leandrew Koyanagi, MD;  Location: [place];  Service: Ophthalmology;  Laterality: Left;  Diabetic - diet controlled sleep apnea  . COLONOSCOPY WITH PROPOFOL N/A 02/25/2017   Procedure: COLONOSCOPY WITH PROPOFOL;  Surgeon: Manya Silvas, MD;  Location: [facility] ENDOSCOPY;  Service: Endoscopy;  Laterality: N/A;  . ESOPHAGOGASTRODUODENOSCOPY (EGD) WITH PROPOFOL N/A 02/25/2017   Procedure: ESOPHAGOGASTRODUODENOSCOPY (EGD) WITH PROPOFOL;  Surgeon: Manya Silvas, MD;  [facility]COPY;  Service: Endoscopy;  Laterality: N/A;  . NASAL SINUS SURGERY    . PACEMAKER PLACEMENT    . TONSILLECTOMY    . TONSILLECTOMY      Prior to Admission medications   Medication Sig Start Date End Date Taking? Authorizing Provider  ALPRAZolam Duanne Moron) 0.5 MG tablet Take 0.5 mg by mouth at bedtime as needed for anxiety.   Yes [provider]  apixaban (ELIQUIS) 5 MG TABS tablet Take 5 mg by mouth 2 (two) times daily.    Yes [provider]  Continuous Blood Gluc Transmit (DEXCOM G6 TRANSMITTER) MISC Use 1 Device 4 (four) times daily 05/18/18  Yes [provider]  FREESTYLE LITE test strip 1 each by Other route as directed. 05/12/18  Yes Medina-Vargas, Monina C, NP  gabapentin (NEURONTIN) 100 MG capsule Take 2 capsules (200 mg total) by mouth 3 (three) times daily. Patient taking differently: Take  100 mg by mouth 2 (two) times daily.  05/12/18 05/12/19 Yes Medina-Vargas, Monina C, NP  lactulose (CHRONULAC) 10 GM/15ML solution Take 15 mLs (10 g total) by mouth daily. Patient taking differently: Take 10 g by mouth 2 (two) times daily.  06/02/18  Yes Salary, Avel Peace, MD  lamoTRIgine (LAMICTAL) 25 MG tablet Take 2 tablets (50 mg total) by mouth 2 (two) times daily. Patient taking differently: Take 60 mg by mouth at bedtime.  05/12/18  Yes Medina-Vargas, Monina C, NP  Lurasidone HCl (LATUDA) 60 MG TABS Take 1 tablet (60 mg total) by mouth daily. Patient taking differently: Take 30 mg by mouth at bedtime.  05/12/18  Yes Medina-Vargas, Monina C, NP  metFORMIN (GLUCOPHAGE) 500 MG tablet Take 500 mg by mouth daily as needed (high blood sugar).   Yes [provider]  QUEtiapine (SEROQUEL) 25 MG tablet Take 25 mg by mouth at bedtime.   Yes [provider]  tamsulosin (FLOMAX) 0.4 MG CAPS capsule Take 1 capsule (0.4 mg total) by mouth 2 (two) times daily. Patient taking differently: Take 0.4 mg by mouth daily.  08/03/18  Yes Billey Co, MD  XIFAXAN 550 MG TABS tablet Take 550  mg by mouth every evening.  07/22/18  Yes [provider]  allopurinol (ZYLOPRIM) 300 MG tablet Take 1 tablet (300 mg total) by mouth daily as needed (gout). 05/12/18 05/12/19  Medina-Vargas, Monina C, NP  AMBULATORY NON FORMULARY MEDICATION BiPAP machine @ 15/12 DX: OSA DX Code: Patient not taking: Reported on 09/08/2018 06/01/18   Salary, Holly Bodily D, MD  amphetamine-dextroamphetamine (ADDERALL) 10 MG tablet Take 1 tablet (10 mg total) by mouth daily. Patient not taking: Reported on 09/08/2018 06/16/18   Gerlene Fee, NP  lidocaine (LIDODERM) 5 % Place 1 patch onto the skin daily. Remove & Discard patch within 12 hours or as directed by MD 06/01/18   Salary, Avel Peace, MD  liver oil-zinc oxide (DESITIN) 40 % ointment Apply topically 3 (three) times daily as needed for irritation. 06/01/18   Salary, Avel Peace, MD  meloxicam (MOBIC) 15 MG tablet Take 1 tablet (15 mg total) by mouth daily. Patient not taking: Reported on 09/08/2018 06/02/18   Salary, Avel Peace, MD  NON FORMULARY Diet Type: NCS    [provider]  omeprazole (PRILOSEC) 40 MG capsule Take 1 capsule (40 mg total) by mouth daily. Patient not taking: Reported on 09/08/2018 05/12/18   Medina-Vargas, Monina C, NP    Allergies Lisinopril; Penicillins; and Clindamycin/lincomycin  Family History  Problem Relation Age of Onset  . Allergies Mother   . Diabetes Father   . Diabetes Sister     Social History Social History   Tobacco Use  . Smoking status: Former Smoker    Packs/day: 1.00    Types: Cigarettes, Cigars    Last attempt to quit: 1985    Years since quitting: 35.1  . Smokeless tobacco: Never Used  Substance Use Topics  . Alcohol use: Not Currently    Comment: occasional  . Drug use: No    Review of Systems  Constitutional: Negative for fever. Eyes: Negative for visual changes. ENT: Negative for sore throat. Neck: No neck pain  Cardiovascular: Negative for chest pain. Respiratory: Negative for shortness of breath. Gastrointestinal: Negative for abdominal pain, vomiting or diarrhea. + dark stool Genitourinary: Negative for dysuria. + decreases urine output Musculoskeletal: + Right lower back pain. Skin: Negative for rash. Neurological: Negative for headaches, weakness or numbness. Psych: No SI or HI  ____________________________________________   PHYSICAL EXAM:  VITAL SIGNS: ED Triage Vitals  Enc Vitals Group     BP 10/23/18 1018 (!) 115/54     Pulse Rate 10/23/18 1018 (!) 103     Resp 10/23/18 1018 16     Temp 10/23/18 1018 98.3 F (36.8 C)     Temp Source 10/23/18 1018 Oral     SpO2 10/23/18 1018 99 %     Weight 10/23/18 1033 224 lb (101.6 kg)     Height 10/23/18 1033 5\' 10"  (1.778 m)     Head Circumference --      Peak Flow --      Pain Score 10/23/18 1032 8     Pain Loc --      Pain  Edu? --      Excl. in [place]? --     Constitutional: Alert and oriented, pale and jaundice. No distress HEENT:      Head: Normocephalic and atraumatic.         Eyes: Conjunctivae are normal. Sclera is non-icteric.       Mouth/Throat: Mucous membranes are moist.       Neck: Supple with no signs of  meningismus.  No C-spine tenderness Cardiovascular: Tachycardic with regular rhythm. No murmurs, gallops, or rubs. 2+ symmetrical distal pulses are present in all extremities. No JVD. Respiratory: Normal respiratory effort. Lungs are clear to auscultation bilaterally. No wheezes, crackles, or rhonchi.  Gastrointestinal: Soft, non tender, and non distended with positive bowel sounds. No rebound or guarding. Musculoskeletal: Nontender with normal range of motion in all extremities. No edema, cyanosis, or erythema of extremities. Bruise to the right lateral abdominal/back area. No t and L spine tenderness.  Neurologic: Normal speech and language. Face is symmetric. Moving all extremities. No gross focal neurologic deficits are appreciated. Skin: Skin is warm, dry and intact. No rash noted. Psychiatric: Mood and affect are normal. Speech and behavior are normal.  ____________________________________________   LABS (all labs ordered are listed, but only abnormal results are displayed)  Labs Reviewed  CBC WITH DIFFERENTIAL/PLATELET - Abnormal; Notable for the following components:      Result Value   RBC 3.61 (*)    Hemoglobin 7.7 (*)    HCT 24.6 (*)    MCV 68.1 (*)    MCH 21.3 (*)    RDW 18.2 (*)    Platelets 142 (*)    All other components within normal limits  COMPREHENSIVE METABOLIC PANEL - Abnormal; Notable for the following components:   Potassium 5.3 (*)    CO2 20 (*)    Glucose, Bld 210 (*)    BUN 50 (*)    Calcium 8.7 (*)    Total Protein 5.5 (*)    Albumin 2.9 (*)    Total Bilirubin 2.9 (*)    All other components within normal limits  AMMONIA - Abnormal; Notable for the following  components:   Ammonia 52 (*)    All other components within normal limits  PROTIME-INR  APTT  URINALYSIS, COMPLETE (UACMP) WITH MICROSCOPIC  TROPONIN I  TYPE AND SCREEN  PREPARE RBC (CROSSMATCH)  ABO/RH   ____________________________________________  EKG  ED ECG REPORT I, Rudene Re, the attending physician, personally viewed and interpreted this ECG.  AV dual paced rhythm, rate of 77, borderline prolonged QTC, right axis deviation, no concordant ST elevation.  Unchanged from prior ____________________________________________  RADIOLOGY  I have personally reviewed the images performed during this visit and I agree with the Radiologist's read.   Interpretation by Radiologist:  No results found.    ____________________________________________   PROCEDURES  Procedure(s) performed: None Procedures Critical Care performed: yes  CRITICAL CARE Performed by: Rudene Re  ?  Total critical care time: 35 min  Critical care time was exclusive of separately billable procedures and treating other patients.  Critical care was necessary to treat or prevent imminent or life-threatening deterioration.  Critical care was time spent personally by me on the following activities: development of treatment plan with patient and/or surrogate as well as nursing, discussions with consultants, evaluation of patient's response to treatment, examination of patient, obtaining history from patient or surrogate, ordering and performing treatments and interventions, ordering and review of laboratory studies, ordering and review of radiographic studies, pulse oximetry and re-evaluation of patient's condition.  ____________________________________________   INITIAL IMPRESSION / ASSESSMENT AND PLAN / ED COURSE  78 y.o. male history of cirrhosis of the liver, AV block status post pacemaker, stroke on Eliquis, dementia, anemia, sleep apnea who presents for evaluation of several falls  last night.  Patient has bruising to the right lateral trunk area with no abdominal tenderness, no other obvious injuries based on history and physical exam.  EKG showing paced rhythm with no acute ischemia.  Labs showing new anemia with a hemoglobin of 7.7.  Patient's hemoglobin was last checked by his primary care doctor 3 weeks ago and it was 11.4.  Platelets of 142.  Patient is on Eliquis.  INR of 2.24, his not on Coumadin, this is most likely due to cirrhosis of the liver.  Patient is slightly tachycardic but normotensive.  Will transfuse 1 unit of emergent release blood, will start patient on Protonix bolus and drip, octreotide bolus and drip, and Rocephin.  Type and cross for 2 units active. CT head and cspine pending to eval for acute injury from falls. Discussed with Dr. Fritzi Mandes for admission.      As part of my medical decision making, I reviewed the following data within the [place] notes reviewed and incorporated, Labs reviewed , EKG interpreted , Old EKG reviewed, Old chart reviewed, Radiograph reviewed , Discussed with admitting physician , Notes from prior ED visits and [place] Controlled Substance Database    Pertinent labs & imaging results that were available during my care of the patient were reviewed by me and considered in my medical decision making (see chart for details).    ____________________________________________   FINAL CLINICAL IMPRESSION(S) / ED DIAGNOSES  Final diagnoses:  Fall, initial encounter  UGIB (upper gastrointestinal bleed)  Acute blood loss anemia      NEW MEDICATIONS STARTED DURING THIS VISIT:  ED Discharge Orders    None       Note:  This document was prepared using Dragon voice recognition software and may include unintentional dictation errors.    Alfred Levins, [place], MD 10/23/18 [phone]

## 2018-10-23 NOTE — Progress Notes (Signed)
Family Meeting Note  Advance Directive yes  Today a meeting took place with the  Daughter patient is being admitted for G.I. bleed suspected variceal bleeding the setting of nonalcoholic cirrhosis of liver. His hemoglobin is 7.7. He is being admitted in the ICU he has several other comorbidities and chronic medical problems. Patient is critically ill. Getting blood transfusion stat along with IV Protonix drip. Discuss code status patient's daughter mentions patient is a DNR. Time spent 16 minutes. Consider palliative care consultation pending hospital course.    Fritzi Mandes, MD

## 2018-10-23 NOTE — Procedures (Addendum)
Oral Intubation Procedure Note  Indications: Airway protection for EGD Consent: Unable to obtain consent because of altered level of consciousness. Time Out: Verified patient identification, verified procedure, site/side was marked, verified correct patient position, special equipment/implants available, medications/allergies/relevent history reviewed, required imaging and test results available.   Pre-meds: Fentanyl 100 mcg, Etomidate 20 mg IV  Neuromuscular blockade: Rocuronium 50 mg IV  Laryngoscope: #4 MAC  Visualization: cords fully visualized  ETT: 8.0 ETT passed on first attempt and secured @ 24 cm at upper incisors  Findings: normal airway, relatively small oral cavity and large tongue   Evaluation:  Tube position confirmed by auscultation and EZCap CXR pending  Pt tolerated procedure well without complications   Merton Border, MD PCCM service Mobile [phone] Pager [phone] 10/23/2018 2:57 PM

## 2018-10-24 LAB — BASIC METABOLIC PANEL
Anion gap: 6 (ref 5–15)
BUN: 73 mg/dL — ABNORMAL HIGH (ref 8–23)
CO2: 21 mmol/L — ABNORMAL LOW (ref 22–32)
Calcium: 8 mg/dL — ABNORMAL LOW (ref 8.9–10.3)
Chloride: 112 mmol/L — ABNORMAL HIGH (ref 98–111)
Creatinine, Ser: 1.43 mg/dL — ABNORMAL HIGH (ref 0.61–1.24)
GFR calc Af Amer: 54 mL/min — ABNORMAL LOW (ref >60)
GFR calc non Af Amer: 47 mL/min — ABNORMAL LOW (ref >60)
GLUCOSE: 132 mg/dL — AB (ref 70–99)
Potassium: 4.9 mmol/L (ref 3.5–5.1)
Sodium: 139 mmol/L (ref 135–145)

## 2018-10-24 LAB — CBC
HCT: 21.2 % — ABNORMAL LOW (ref 39.0–52.0)
Hemoglobin: 6.8 g/dL — ABNORMAL LOW (ref 13.0–17.0)
MCH: 22.7 pg — ABNORMAL LOW (ref 26.0–34.0)
MCHC: 32.1 g/dL (ref 30.0–36.0)
MCV: 70.7 fL — ABNORMAL LOW (ref 80.0–100.0)
Platelets: 130 10*3/uL — ABNORMAL LOW (ref 150–400)
RBC: 3 MIL/uL — ABNORMAL LOW (ref 4.22–5.81)
RDW: 20.3 % — ABNORMAL HIGH (ref 11.5–15.5)
WBC: 7.9 10*3/uL (ref 4.0–10.5)
nRBC: 0.3 % — ABNORMAL HIGH (ref 0.0–0.2)

## 2018-10-24 LAB — GLUCOSE, CAPILLARY
Glucose-Capillary: 108 mg/dL — ABNORMAL HIGH (ref 70–99)
Glucose-Capillary: 136 mg/dL — ABNORMAL HIGH (ref 70–99)
Glucose-Capillary: 148 mg/dL — ABNORMAL HIGH (ref 70–99)
Glucose-Capillary: 156 mg/dL — ABNORMAL HIGH (ref 70–99)
Glucose-Capillary: 168 mg/dL — ABNORMAL HIGH (ref 70–99)
Glucose-Capillary: 169 mg/dL — ABNORMAL HIGH (ref 70–99)

## 2018-10-24 LAB — HEMOGLOBIN
Hemoglobin: 7.6 g/dL — ABNORMAL LOW (ref 13.0–17.0)
Hemoglobin: 7.5 g/dL — ABNORMAL LOW (ref 13.0–17.0)

## 2018-10-24 LAB — PREPARE RBC (CROSSMATCH)

## 2018-10-24 MED ORDER — SODIUM CHLORIDE 0.9% IV SOLUTION
Freq: Once | INTRAVENOUS | Status: AC
  Administered 2018-10-24: 07:00:00 via INTRAVENOUS

## 2018-10-24 MED ORDER — ORAL CARE MOUTH RINSE
15.0000 mL | Freq: Two times a day (BID) | OROMUCOSAL | Status: DC
  Administered 2018-10-24 – 2018-10-28 (×6): 15 mL via OROMUCOSAL

## 2018-10-24 NOTE — Progress Notes (Signed)
Extubated without complications to 2lnc 

## 2018-10-24 NOTE — Progress Notes (Signed)
[name] Lame, MD [facility]   [address] [place], [place] [address] Phone: [phone] Fax : [phone]   Subjective: The patient underwent an EGD yesterday with intubation to protect the airway for the EGD.  The patient had a massive amount of blood in the stomach regurgitating up the esophagus.  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  The patient had varices seen and banded.  He remained intubated overnight without any issues overnight.  Hemoglobin done in the evening was stable.   Objective: Vital signs in last 24 hours: Vitals:   10/24/18 0300 10/24/18 0400 10/24/18 0500 10/24/18 0600  BP: (!) 98/40 (!) 99/42 (!) 97/42 (!) 107/39  Pulse: 61 65 62 84  Resp: 15 18 16 19   Temp:  99 F (37.2 C)    TempSrc:  Axillary    SpO2: 100% 100% 100% 100%  Weight:      Height:       Weight change:   Intake/Output Summary (Last 24 hours) at 10/24/2018 3383 Last data filed at 10/24/2018 0600 Gross per 24 hour  Intake 1422.67 ml  Output 650 ml  Net 772.67 ml     Exam: Heart:: Regular rate and rhythm, S1S2 present or without murmur or extra heart sounds Lungs: normal and clear to auscultation and percussion Abdomen: soft, nontender, normal bowel sounds   Lab Results: @LABTEST2 @ Micro Results: Recent Results (from the past 240 hour(s))  MRSA PCR Screening     Status: None   Collection Time: 10/23/18  2:21 PM  Result Value Ref Range Status   MRSA by PCR NEGATIVE NEGATIVE Final    Comment:        The GeneXpert MRSA Assay (FDA approved for NASAL specimens only), is one component of a comprehensive MRSA colonization surveillance program. It is not intended to diagnose MRSA infection nor to guide or monitor treatment for MRSA infections. Performed at [facility], [address], [place], [place] [address]    Studies/Results: Ct Head Wo Contrast  Result Date: 10/23/2018 CLINICAL DATA:  Patient fell 3 times yesterday. EXAM:  CT HEAD WITHOUT CONTRAST CT CERVICAL SPINE WITHOUT CONTRAST TECHNIQUE: Multidetector CT imaging of the head and cervical spine was performed following the standard protocol without intravenous contrast. Multiplanar CT image reconstructions of the cervical spine were also generated. COMPARISON:  Head CT 07/31/2018. FINDINGS: CT HEAD FINDINGS Brain: There is no evidence for acute hemorrhage, hydrocephalus, mass lesion, or abnormal extra-axial fluid collection. No definite CT evidence for acute infarction. Diffuse loss of parenchymal volume is consistent with atrophy. Patchy low attenuation in the deep hemispheric and periventricular white matter is nonspecific, but likely reflects chronic microvascular ischemic demyelination. 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 Vascular: No hyperdense vessel or unexpected calcification. Skull: No evidence for fracture. No worrisome lytic or sclerotic lesion. Sinuses/Orbits: Chronic opacification of the right maxillary sinus and right mastoid air cells. Opacification of the right sphenoid sinus is new since prior study. Other: None. CT CERVICAL SPINE FINDINGS Alignment: Straightening of normal cervical lordosis evident. Trace anterolisthesis of C3 on 4 likely related to associated facet disease. Skull base and vertebrae: No acute fracture. No primary bone lesion or focal pathologic process. Soft tissues and spinal canal: No prevertebral fluid or swelling. No visible canal hematoma. Disc levels: Loss of disc height with endplate degeneration evident at C5-6 and C6-7. Upper cervical facet degeneration noted bilaterally. Upper chest: Negative. Other: None. IMPRESSION: 1. No acute intracranial abnormality. 2.  Atrophy with chronic small vessel white matter ischemic disease. 3. Chronic right maxillary and ethmoid sinus disease. Opacification of the right sphenoid sinus is new since prior study. 4. Degenerative changes in the cervical spine without fracture. 5. Loss of  cervical lordosis. This can be related to patient positioning, muscle spasm or soft tissue injury. Electronically Signed   By: Misty Stanley M.D.   On: 10/23/2018 13:23   Ct Cervical Spine Wo Contrast  Result Date: 10/23/2018 CLINICAL DATA:  Patient fell 3 times yesterday. EXAM: CT HEAD WITHOUT CONTRAST CT CERVICAL SPINE WITHOUT CONTRAST TECHNIQUE: Multidetector CT imaging of the head and cervical spine was performed following the standard protocol without intravenous contrast. Multiplanar CT image reconstructions of the cervical spine were also generated. COMPARISON:  Head CT 07/31/2018. FINDINGS: CT HEAD FINDINGS Brain: There is no evidence for acute hemorrhage, hydrocephalus, mass lesion, or abnormal extra-axial fluid collection. No definite CT evidence for acute infarction. Diffuse loss of parenchymal volume is consistent with atrophy. Patchy low attenuation in the deep hemispheric and periventricular white matter is nonspecific, but likely reflects chronic microvascular ischemic demyelination. 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 Vascular: No hyperdense vessel or unexpected calcification. Skull: No evidence for fracture. No worrisome lytic or sclerotic lesion. Sinuses/Orbits: Chronic opacification of the right maxillary sinus and right mastoid air cells. Opacification of the right sphenoid sinus is new since prior study. Other: None. CT CERVICAL SPINE FINDINGS Alignment: Straightening of normal cervical lordosis evident. Trace anterolisthesis of C3 on 4 likely related to associated facet disease. Skull base and vertebrae: No acute fracture. No primary bone lesion or focal pathologic process. Soft tissues and spinal canal: No prevertebral fluid or swelling. No visible canal hematoma. Disc levels: Loss of disc height with endplate degeneration evident at C5-6 and C6-7. Upper cervical facet degeneration noted bilaterally. Upper chest: Negative. Other: None. IMPRESSION: 1. No acute  intracranial abnormality. 2. Atrophy with chronic small vessel white matter ischemic disease. 3. Chronic right maxillary and ethmoid sinus disease. Opacification of the right sphenoid sinus is new since prior study. 4. Degenerative changes in the cervical spine without fracture. 5. Loss of cervical lordosis. This can be related to patient positioning, muscle spasm or soft tissue injury. Electronically Signed   By: Misty Stanley M.D.   On: 10/23/2018 13:23   Dg Chest Port 1 View  Result Date: 10/23/2018 CLINICAL DATA:  Intubation after endoscopy. EXAM: PORTABLE CHEST 1 VIEW COMPARISON:  07/31/2018 FINDINGS: 1532 hours. Endotracheal tube tip is 1.4 cm above the base the carina. Low lung volumes. The cardio pericardial silhouette is enlarged. Left base atelectasis noted. No edema or focal airspace consolidation. No substantial pleural effusion. Interstitial markings are diffusely coarsened with chronic features. Left permanent pacemaker again noted. Telemetry leads overlie the chest. Prominent gastric bubble. IMPRESSION: 1. Endotracheal tube tip 1.4 cm above the base of the carina. 2. Low volume film without left base atelectasis. 3. Prominent gastric bubble. Electronically Signed   By: Misty Stanley M.D.   On: 10/23/2018 17:33   Medications: I have reviewed the patient's current medications. Scheduled Meds: . chlorhexidine gluconate (MEDLINE KIT)  15 mL Mouth Rinse BID  . insulin aspart  0-15 Units Subcutaneous Q4H  . lactulose  10 g Oral BID  . mouth rinse  15 mL Mouth Rinse 10 times per day  . [START ON 10/26/2018] pantoprazole  40 mg Intravenous Q12H   Continuous Infusions: . lactated ringers 50 mL/hr at 10/23/18 1419  . octreotide  (SANDOSTATIN)  IV infusion 50 mcg/hr (10/23/18 2130)  . pantoprozole (PROTONIX) infusion 8 mg/hr (10/24/18 0600)  . propofol (DIPRIVAN) infusion 10 mcg/kg/min (10/24/18 0606)   PRN Meds:.ALPRAZolam, fentaNYL (SUBLIMAZE) injection, [DISCONTINUED] ondansetron **OR**  ondansetron (ZOFRAN) IV, polyethylene glycol   Assessment: Active Problems:   GI bleed   Secondary esophageal varices with bleeding (HCC)   Acute gastrointestinal hemorrhage   Acute blood loss anemia    Plan: The patient appears comfortable intubated and sedated this morning.  There is no events overnight.  The patient will have his hemoglobin checked again this morning.  There is no sign of any further GI bleeding.  We will continue the octreotide and supportive care.  I have discussed with the patient the patient's prognosis and gravity of having a variceal bleed indicating advanced liver cirrhosis.  The family reports that they understand.   LOS: 1 day   Devrin Monforte 10/24/2018, 6:07 AM

## 2018-10-24 NOTE — Progress Notes (Signed)
PCCM PROGRESS NOTE  Requesting MD/Service: Allen Norris Date of initial consultation: 10/23/18 Reason for consultation: UGIB, need for intubation prior to EGD  PT PROFILE: 78 y.o. male with history of nonalcoholic fatty liver disease/cirrhosis presented to [facility] ED 2/22 with altered mental status and hematemesis.  Noted to be taking nonsteroidal anti-inflammatory medications for thoracic pain after a recent fall.  MAJOR EVENTS/TEST RESULTS: 02/22 admission to ICU via ED with hematemesis 02/22 intubated for EGD.  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 Three bands were successfully placed with incomplete eradication of varices. There was no bleeding at the end of the maneuver. Hematin (altered blood/coffee-ground-like material) was found in the entire examined stomach 02/23 transfused 2 units RBCs for hemoglobin 6.8.  No overt bleeding 02/23 extubated  INDWELLING DEVICES:: ETT 02/22 >> 02/23  MICRO DATA: MRSA PCR >> NEG  ANTIMICROBIALS:  Ceftriaxone (SBP prophylaxis) 02/22 >>   24 INTERVAL: No major events  SUBJ: Passed SBT and extubated under my supervision.  Tolerating well.  Cognition intact.  Denies pain and shortness of breath post extubation.   Vitals:   10/24/18 1045 10/24/18 1100 10/24/18 1115 10/24/18 1130  BP: (!) 118/44 (!) 111/54  (!) 106/42  Pulse: 63 60 76 60  Resp: 16 17 19 18   Temp:      TempSrc:      SpO2: 100% 100% 100% 98%  Weight:      Height:       Vent Mode: PRVC FiO2 (%):  [40 %] 40 % Set Rate:  [18 bmp] 18 bmp Vt Set:  [500 mL] 500 mL PEEP:  [5 cmH20] 5 cmH20 Plateau Pressure:  [16 cmH20] 16 cmH20   EXAM:  Gen: NAD HEENT: NCAT, sclera white Neck: No JVD Lungs: breath sounds full, no wheezes or other adventitious sounds Cardiovascular: RRR, no murmurs Abdomen: Soft, nontender, normal BS Ext: without clubbing, cyanosis, edema Neuro: grossly  intact Skin: Limited exam, no lesions noted   DATA:   BMP Latest Ref Rng & Units 10/24/2018 10/23/2018 07/31/2018  Glucose 70 - 99 mg/dL 132(H) 210(H) 211(H)  BUN 8 - 23 mg/dL 73(H) 50(H) 18  Creatinine 0.61 - 1.24 mg/dL 1.43(H) 0.91 0.78  Sodium 135 - 145 mmol/L 139 138 138  Potassium 3.5 - 5.1 mmol/L 4.9 5.3(H) 3.7  Chloride 98 - 111 mmol/L 112(H) 106 104  CO2 22 - 32 mmol/L 21(L) 20(L) 25  Calcium 8.9 - 10.3 mg/dL 8.0(L) 8.7(L) 9.2    CBC Latest Ref Rng & Units 10/24/2018 10/23/2018 10/23/2018  WBC 4.0 - 10.5 K/uL 7.9 - -  Hemoglobin 13.0 - 17.0 g/dL 6.8(L) 7.2(L) 7.7(L)  Hematocrit 39.0 - 52.0 % 21.2(L) - -  Platelets 150 - 400 K/uL 130(L) - -    CXR: No new film  I have personally reviewed all chest radiographs reported above including CXRs and CT chest unless otherwise indicated  IMPRESSION:   UGIB with hematemesis  Esophageal varices Acute blood loss anemia Fatty liver with cirrhosis History of OSA Type 2 diabetes, controlled History of dementia History of bipolar disorder  PLAN:  Monitor in ICU post extubation Supplemental oxygen as needed Pantoprazole and octreotide infusions per GI Continue empiric ceftriaxone for SBP prophylaxis DVT px: SCDs Monitor CBC intermittently Transfuse per usual guidelines.  Transfusion threshold Hgb 7.0 or hypotension with active bleeding Cont SSI as ordered  Merton Border, MD PCCM service Mobile [phone] Pager [phone] 10/24/2018  11:58 AM

## 2018-10-24 NOTE — Progress Notes (Signed)
[place] [facility] [place] NAME: [name] Day    MR#:  [id number]  DATE OF BIRTH:  [birth year]  SUBJECTIVE:  CHIEF COMPLAINT: Patient is extubated,lethargic but answers few questions son and daughter-in-law at bedside requesting palliative care consult  REVIEW OF SYSTEMS:  CONSTITUTIONAL: No fever, fatigue, endorses weakness.  EYES: No blurred or double vision.  EARS, NOSE, AND THROAT: No tinnitus or ear pain.  RESPIRATORY: No cough, shortness of breath, wheezing or hemoptysis.  CARDIOVASCULAR: No chest pain, orthopnea, edema.  GASTROINTESTINAL: No nausea, vomiting, diarrhea or abdominal pain.  HEMATOLOGY: No anemia, easy bruising or bleeding SKIN: No rash or lesion. MUSCULOSKELETAL: No joint pain or arthritis.   NEUROLOGIC: No tingling, numbness, weakness.  PSYCHIATRY: No anxiety or depression.   DRUG ALLERGIES:   Allergies  Allergen Reactions  . Lisinopril Other (See Comments)    REACTION: Cough  . Penicillins Other (See Comments)    Reaction: unknown childhood reaction Has patient had a PCN reaction causing immediate rash, facial/tongue/throat swelling, SOB or lightheadedness with hypotension: Unknown Has patient had a PCN reaction causing severe rash involving mucus membranes or skin necrosis: Unknown Has patient had a PCN reaction that required hospitalization: Unknown Has patient had a PCN reaction occurring within the last 10 years: Unknown If all of the above answers are "NO", then may proceed with Cephalosporin use.   . Clindamycin/Lincomycin Rash and Other (See Comments)    "turns me purple" "turns me orange"    VITALS:  Blood pressure (!) 100/43, pulse 61, temperature 98.6 F (37 C), temperature source Oral, resp. rate 12, height 5\' 10"  (1.778 m), weight 101.6 kg, SpO2 97 %.  PHYSICAL EXAMINATION:  GENERAL:  78 y.o.-year-old patient lying in the bed with no acute distress.  EYES: Pupils equal, round, reactive to  light and accommodation. No scleral icterus. Extraocular muscles intact.  HEENT: Head atraumatic, normocephalic. Oropharynx and nasopharynx clear.  NECK:  Supple, no jugular venous distention. No thyroid enlargement, no tenderness.  LUNGS: Normal breath sounds bilaterally, no wheezing, rales,rhonchi or crepitation. No use of accessory muscles of respiration.  CARDIOVASCULAR: S1, S2 normal. No murmurs, rubs, or gallops.  ABDOMEN: Soft, nontender, nondistended. Bowel sounds present. No organomegaly or mass.  EXTREMITIES: No pedal edema, cyanosis, or clubbing.  NEUROLOGIC: Awake, alert and oriented x1-2 sensation intact. Gait not checked.  PSYCHIATRIC: The patient is alert .  SKIN: No obvious rash, lesion, or ulcer.    LABORATORY PANEL:   CBC Recent Labs  Lab 10/24/18 0555  WBC 7.9  HGB 6.8*  HCT 21.2*  PLT 130*   ------------------------------------------------------------------------------------------------------------------  Chemistries  Recent Labs  Lab 10/23/18 1027 10/24/18 0555  NA 138 139  K 5.3* 4.9  CL 106 112*  CO2 20* 21*  GLUCOSE 210* 132*  BUN 50* 73*  CREATININE 0.91 1.43*  CALCIUM 8.7* 8.0*  AST 38  --   ALT 17  --   ALKPHOS 64  --   BILITOT 2.9*  --    ------------------------------------------------------------------------------------------------------------------  Cardiac Enzymes Recent Labs  Lab 10/23/18 1144  TROPONINI <0.03   ------------------------------------------------------------------------------------------------------------------  RADIOLOGY:  Ct Head Wo Contrast  Result Date: 10/23/2018 CLINICAL DATA:  Patient fell 3 times yesterday. EXAM: CT HEAD WITHOUT CONTRAST CT CERVICAL SPINE WITHOUT CONTRAST TECHNIQUE: Multidetector CT imaging of the head and cervical spine was performed following the standard protocol without intravenous contrast. Multiplanar CT image reconstructions of the cervical spine were also generated. COMPARISON:   Head CT 07/31/2018. FINDINGS:  CT HEAD FINDINGS Brain: There is no evidence for acute hemorrhage, hydrocephalus, mass lesion, or abnormal extra-axial fluid collection. No definite CT evidence for acute infarction. Diffuse loss of parenchymal volume is consistent with atrophy. Patchy low attenuation in the deep hemispheric and periventricular white matter is nonspecific, but likely reflects chronic microvascular ischemic demyelination. 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 Vascular: No hyperdense vessel or unexpected calcification. Skull: No evidence for fracture. No worrisome lytic or sclerotic lesion. Sinuses/Orbits: Chronic opacification of the right maxillary sinus and right mastoid air cells. Opacification of the right sphenoid sinus is new since prior study. Other: None. CT CERVICAL SPINE FINDINGS Alignment: Straightening of normal cervical lordosis evident. Trace anterolisthesis of C3 on 4 likely related to associated facet disease. Skull base and vertebrae: No acute fracture. No primary bone lesion or focal pathologic process. Soft tissues and spinal canal: No prevertebral fluid or swelling. No visible canal hematoma. Disc levels: Loss of disc height with endplate degeneration evident at C5-6 and C6-7. Upper cervical facet degeneration noted bilaterally. Upper chest: Negative. Other: None. IMPRESSION: 1. No acute intracranial abnormality. 2. Atrophy with chronic small vessel white matter ischemic disease. 3. Chronic right maxillary and ethmoid sinus disease. Opacification of the right sphenoid sinus is new since prior study. 4. Degenerative changes in the cervical spine without fracture. 5. Loss of cervical lordosis. This can be related to patient positioning, muscle spasm or soft tissue injury. Electronically Signed   By: Misty Stanley M.D.   On: 10/23/2018 13:23   Ct Cervical Spine Wo Contrast  Result Date: 10/23/2018 CLINICAL DATA:  Patient fell 3 times yesterday. EXAM: CT HEAD  WITHOUT CONTRAST CT CERVICAL SPINE WITHOUT CONTRAST TECHNIQUE: Multidetector CT imaging of the head and cervical spine was performed following the standard protocol without intravenous contrast. Multiplanar CT image reconstructions of the cervical spine were also generated. COMPARISON:  Head CT 07/31/2018. FINDINGS: CT HEAD FINDINGS Brain: There is no evidence for acute hemorrhage, hydrocephalus, mass lesion, or abnormal extra-axial fluid collection. No definite CT evidence for acute infarction. Diffuse loss of parenchymal volume is consistent with atrophy. Patchy low attenuation in the deep hemispheric and periventricular white matter is nonspecific, but likely reflects chronic microvascular ischemic demyelination. 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 Vascular: No hyperdense vessel or unexpected calcification. Skull: No evidence for fracture. No worrisome lytic or sclerotic lesion. Sinuses/Orbits: Chronic opacification of the right maxillary sinus and right mastoid air cells. Opacification of the right sphenoid sinus is new since prior study. Other: None. CT CERVICAL SPINE FINDINGS Alignment: Straightening of normal cervical lordosis evident. Trace anterolisthesis of C3 on 4 likely related to associated facet disease. Skull base and vertebrae: No acute fracture. No primary bone lesion or focal pathologic process. Soft tissues and spinal canal: No prevertebral fluid or swelling. No visible canal hematoma. Disc levels: Loss of disc height with endplate degeneration evident at C5-6 and C6-7. Upper cervical facet degeneration noted bilaterally. Upper chest: Negative. Other: None. IMPRESSION: 1. No acute intracranial abnormality. 2. Atrophy with chronic small vessel white matter ischemic disease. 3. Chronic right maxillary and ethmoid sinus disease. Opacification of the right sphenoid sinus is new since prior study. 4. Degenerative changes in the cervical spine without fracture. 5. Loss of  cervical lordosis. This can be related to patient positioning, muscle spasm or soft tissue injury. Electronically Signed   By: Misty Stanley M.D.   On: 10/23/2018 13:23   Dg Chest Port 1 View  Result Date: 10/23/2018  CLINICAL DATA:  Intubation after endoscopy. EXAM: PORTABLE CHEST 1 VIEW COMPARISON:  07/31/2018 FINDINGS: 1532 hours. Endotracheal tube tip is 1.4 cm above the base the carina. Low lung volumes. The cardio pericardial silhouette is enlarged. Left base atelectasis noted. No edema or focal airspace consolidation. No substantial pleural effusion. Interstitial markings are diffusely coarsened with chronic features. Left permanent pacemaker again noted. Telemetry leads overlie the chest. Prominent gastric bubble. IMPRESSION: 1. Endotracheal tube tip 1.4 cm above the base of the carina. 2. Low volume film without left base atelectasis. 3. Prominent gastric bubble. Electronically Signed   By: Misty Stanley M.D.   On: 10/23/2018 17:33    EKG:   Orders placed or performed during the hospital encounter of 10/23/18  . ED EKG  . ED EKG    ASSESSMENT AND PLAN:     [name] Day  is a 78 y.o. male with a known history of nonalcoholic cirrhosis of liver, bipolar disorder, diabetes, dementia, hypertension, issues with memory, renal mass (not pursuing workup), history of a fib on oral anticoagulation, history of AV block status post pacemaker and sleep apnea comes to the emergency room due to frequent falls at home last night.   1. G.I. bleed/hematemesis in the setting of nonalcoholic cirrhosis of liver suspect variceal bleed versus rule out peptic ulcer disease -IV fluids --IV Protonix drip, IV Octreotide drip -GI did endoscopy which has revealed grade 3 esophageal varices incomplete eradication banding done -transfuse one unit of blood transfusion, hemoglobin Q8 hours transfuse as needed -spoke with Dr. Allen Norris to see patient -His hemoglobin 2 1/2 weeks ago was 11.4--hemoglobin today 7.7--1  unit PRBC--hgb-6.8   2. Chronic atrial fibrillation on eliquis  -hold oral anticoagulation  3. Nonalcoholic cirrhosis of liver -this was diagnosed about 3 to 6 months ago -follows with Dr. Ricky Stabs outpatient -patient has chronically elevated bilirubin and chronically elevated ammonia -continue lactulose when able to  4. Frequent falls -physical therapy to see patient after G.I. bleed results  5. Type II diabetes -sliding scale insulin  6. Bipolar disorder -since patient is NPO  holding home meds   7.  Failure to thrive with poor prognosis Palliative care consult placed as requested by son and daughter-in-law at bedside   All the records are reviewed and case discussed with Care Management/Social Workerr. Management plans discussed with the patient, son [name] and daughter-in-law Lattie Haw and they are in agreement.  CODE STATUS: dnr   TOTAL TIME TAKING CARE OF THIS PATIENT: 36  minutes.   POSSIBLE D/C IN 2-3 DAYS, DEPENDING ON CLINICAL CONDITION.  Note: This dictation was prepared with Dragon dictation along with smaller phrase technology. Any transcriptional errors that result from this process are unintentional.   Nicholes Mango M.D on 10/24/2018 at 2:29 PM  Between 7am to 6pm - Pager - [phone] After 6pm go to [email or web address] - password [email or web address] [email or web address]  Office  [phone]  CC: Primary care physician; Idelle Crouch, MD

## 2018-10-24 NOTE — Progress Notes (Signed)
[place] Progress Note Patient Name: [name] Day DOB: [birth year] MRN: [id number]   Date of Service  10/24/2018  HPI/Events of Note  Hgb drop from 7.1 to 6.8 Known variceal bleed  eICU Interventions  Plan Transfuse 1 unit pRBC Post-transfusion CBC     Intervention Category Intermediate Interventions: Bleeding - evaluation and treatment with blood products  Charlsey Moragne 10/24/2018, 6:50 AM

## 2018-10-25 ENCOUNTER — Encounter: Payer: Self-pay | Admitting: Gastroenterology

## 2018-10-25 DIAGNOSIS — Z7189 Other specified counseling: Secondary | ICD-10-CM

## 2018-10-25 DIAGNOSIS — Z515 Encounter for palliative care: Secondary | ICD-10-CM

## 2018-10-25 LAB — BPAM RBC
Blood Product Expiration Date: 202003132359
Blood Product Expiration Date: 202003212359
Blood Product Expiration Date: 202003212359
ISSUE DATE / TIME: 202002221213
ISSUE DATE / TIME: 202002221213
ISSUE DATE / TIME: 202002230733
Unit Type and Rh: 5100
Unit Type and Rh: 5100
Unit Type and Rh: 5100

## 2018-10-25 LAB — HEMOGLOBIN
Hemoglobin: 8.2 g/dL — ABNORMAL LOW (ref 13.0–17.0)
Hemoglobin: 8.1 g/dL — ABNORMAL LOW (ref 13.0–17.0)

## 2018-10-25 LAB — COMPREHENSIVE METABOLIC PANEL
ALT: 16 U/L (ref 0–44)
AST: 30 U/L (ref 15–41)
Albumin: 2.4 g/dL — ABNORMAL LOW (ref 3.5–5.0)
Alkaline Phosphatase: 57 U/L (ref 38–126)
Anion gap: 6 (ref 5–15)
BILIRUBIN TOTAL: 3.7 mg/dL — AB (ref 0.3–1.2)
BUN: 75 mg/dL — ABNORMAL HIGH (ref 8–23)
CALCIUM: 7.9 mg/dL — AB (ref 8.9–10.3)
CO2: 21 mmol/L — ABNORMAL LOW (ref 22–32)
Chloride: 113 mmol/L — ABNORMAL HIGH (ref 98–111)
Creatinine, Ser: 1.18 mg/dL (ref 0.61–1.24)
GFR calc Af Amer: >60 mL/min (ref >60)
GFR calc non Af Amer: 59 mL/min — ABNORMAL LOW (ref >60)
GLUCOSE: 178 mg/dL — AB (ref 70–99)
Potassium: 4.1 mmol/L (ref 3.5–5.1)
Sodium: 140 mmol/L (ref 135–145)
Total Protein: 4.3 g/dL — ABNORMAL LOW (ref 6.5–8.1)

## 2018-10-25 LAB — URINALYSIS, COMPLETE (UACMP) WITH MICROSCOPIC
Bacteria, UA: NONE SEEN
Bilirubin Urine: NEGATIVE
Glucose, UA: NEGATIVE mg/dL
Ketones, ur: NEGATIVE mg/dL
Nitrite: NEGATIVE
Protein, ur: NEGATIVE mg/dL
SQUAMOUS EPITHELIAL / LPF: NONE SEEN (ref 0–5)
Specific Gravity, Urine: 1.017 (ref 1.005–1.030)
pH: 5 (ref 5.0–8.0)

## 2018-10-25 LAB — CBC
HEMATOCRIT: 21.4 % — AB (ref 39.0–52.0)
Hemoglobin: 7.1 g/dL — ABNORMAL LOW (ref 13.0–17.0)
MCH: 23.8 pg — ABNORMAL LOW (ref 26.0–34.0)
MCHC: 33.2 g/dL (ref 30.0–36.0)
MCV: 71.8 fL — ABNORMAL LOW (ref 80.0–100.0)
Platelets: 114 10*3/uL — ABNORMAL LOW (ref 150–400)
RBC: 2.98 MIL/uL — ABNORMAL LOW (ref 4.22–5.81)
RDW: 21.5 % — ABNORMAL HIGH (ref 11.5–15.5)
WBC: 6.2 10*3/uL (ref 4.0–10.5)
nRBC: 0.5 % — ABNORMAL HIGH (ref 0.0–0.2)

## 2018-10-25 LAB — TYPE AND SCREEN
ABO/RH(D): O POS
Antibody Screen: NEGATIVE
Unit division: 0
Unit division: 0
Unit division: 0

## 2018-10-25 LAB — GLUCOSE, CAPILLARY
Glucose-Capillary: 157 mg/dL — ABNORMAL HIGH (ref 70–99)
Glucose-Capillary: 161 mg/dL — ABNORMAL HIGH (ref 70–99)
Glucose-Capillary: 146 mg/dL — ABNORMAL HIGH (ref 70–99)
Glucose-Capillary: 152 mg/dL — ABNORMAL HIGH (ref 70–99)
Glucose-Capillary: 218 mg/dL — ABNORMAL HIGH (ref 70–99)
Glucose-Capillary: 205 mg/dL — ABNORMAL HIGH (ref 70–99)

## 2018-10-25 LAB — PREPARE RBC (CROSSMATCH)

## 2018-10-25 MED ORDER — GABAPENTIN 100 MG PO CAPS
100.0000 mg | ORAL_CAPSULE | Freq: Two times a day (BID) | ORAL | Status: DC
  Administered 2018-10-25 – 2018-10-29 (×8): 100 mg via ORAL
  Filled 2018-10-25 (×9): qty 1

## 2018-10-25 MED ORDER — GERHARDT'S BUTT CREAM
TOPICAL_CREAM | Freq: Four times a day (QID) | CUTANEOUS | Status: DC | PRN
  Administered 2018-10-25 – 2018-10-27 (×4): via TOPICAL
  Filled 2018-10-25: qty 1

## 2018-10-25 MED ORDER — QUETIAPINE FUMARATE 25 MG PO TABS
25.0000 mg | ORAL_TABLET | Freq: Every day | ORAL | Status: DC
  Administered 2018-10-25: 25 mg via ORAL
  Administered 2018-10-26: 22:00:00 12.5 mg via ORAL
  Administered 2018-10-27: 23:00:00 25 mg via ORAL
  Administered 2018-10-28: 12.5 mg via ORAL
  Filled 2018-10-25 (×5): qty 1

## 2018-10-25 MED ORDER — LURASIDONE HCL 40 MG PO TABS
60.0000 mg | ORAL_TABLET | Freq: Every day | ORAL | Status: DC
  Administered 2018-10-25 – 2018-10-28 (×4): 60 mg via ORAL
  Filled 2018-10-25 (×5): qty 2

## 2018-10-25 MED ORDER — LAMOTRIGINE 25 MG PO TABS
60.0000 mg | ORAL_TABLET | Freq: Every day | ORAL | Status: DC
  Administered 2018-10-25 – 2018-10-28 (×4): 62.5 mg via ORAL
  Filled 2018-10-25 (×5): qty 3

## 2018-10-25 MED ORDER — TAMSULOSIN HCL 0.4 MG PO CAPS
0.4000 mg | ORAL_CAPSULE | Freq: Every day | ORAL | Status: DC
  Administered 2018-10-26 – 2018-10-29 (×4): 0.4 mg via ORAL
  Filled 2018-10-25 (×4): qty 1

## 2018-10-25 MED ORDER — PANTOPRAZOLE SODIUM 40 MG IV SOLR
40.0000 mg | Freq: Two times a day (BID) | INTRAVENOUS | Status: DC
  Administered 2018-10-25 – 2018-10-28 (×7): 40 mg via INTRAVENOUS
  Filled 2018-10-25 (×7): qty 40

## 2018-10-25 MED ORDER — MORPHINE SULFATE (PF) 2 MG/ML IV SOLN
0.5000 mg | INTRAVENOUS | Status: DC | PRN
  Administered 2018-10-25: 0.5 mg via INTRAVENOUS
  Filled 2018-10-25: qty 1

## 2018-10-25 MED ORDER — SODIUM CHLORIDE 0.9 % IV SOLN
2.0000 g | Freq: Every day | INTRAVENOUS | Status: AC
  Administered 2018-10-25 – 2018-10-29 (×5): 2 g via INTRAVENOUS
  Filled 2018-10-25 (×5): qty 2

## 2018-10-25 MED ORDER — CIPROFLOXACIN IN D5W 400 MG/200ML IV SOLN
400.0000 mg | Freq: Two times a day (BID) | INTRAVENOUS | Status: DC
  Administered 2018-10-25: 400 mg via INTRAVENOUS
  Filled 2018-10-25 (×2): qty 200

## 2018-10-25 MED ORDER — RIFAXIMIN 550 MG PO TABS
550.0000 mg | ORAL_TABLET | Freq: Every evening | ORAL | Status: DC
  Administered 2018-10-25 – 2018-10-28 (×4): 550 mg via ORAL
  Filled 2018-10-25 (×4): qty 1

## 2018-10-25 MED ORDER — ACETAMINOPHEN 325 MG PO TABS
650.0000 mg | ORAL_TABLET | Freq: Four times a day (QID) | ORAL | Status: DC | PRN
  Administered 2018-10-26 – 2018-10-27 (×2): 650 mg via ORAL
  Filled 2018-10-25 (×2): qty 2

## 2018-10-25 NOTE — Care Management Note (Signed)
Case Management Note  Patient Details  Name: [name] Day MRN: [id number] Date of Birth: 06/01/1941  Subjective/Objective:    Patient admitted with GI bleed.  Patient is from home with son, [name] and his wife [name].  Patient's son and daughter in law provide all necessary care and they have personal care services 3 days per week.  Son cannot remember the name of the company but they come out for 9 hours on Monday's and Friday's and 5 hours on Wed.  Son reports they have all necessary equipment even a hospital bed if needed but the patient did not like sleeping in the hospital bed so it is in storage.   Patient is open with [facility] for PT, Drue Novel notified of admission.  Plan is for patient to return home with son and daughter in law.  Patient is a veteran but not established with the [place].  PCP is Dr. Doy Hutching just seen last month, [facility]. RNCM will cont to follow.  [name] RN BSN [phone]                 Action/Plan:   Expected Discharge Date:                  Expected Discharge Plan:     In-House Referral:     Discharge planning Services     Post Acute Care Choice:    Choice offered to:     DME Arranged:    DME Agency:     HH Arranged:    HH Agency:     Status of Service:     If discussed at [place] of Stay Meetings, dates discussed:    Additional Comments:  [name], RN 10/25/2018, 1:48 PM

## 2018-10-25 NOTE — Progress Notes (Signed)
Dewaine Conger, NP gave order to transfer patient to stepdown level of care.

## 2018-10-25 NOTE — Care Management Note (Signed)
Case Management Note  Patient Details  Name: [name] Day MRN: [id number] Date of Birth: [birth year]  Subjective/Objective:    Per patient son's request, RNCM called [name] to cancel scheduled cataract surgery for 2/26.   Eye can call son to reschedule. [name] RN BSN [phone]                 Action/Plan:   Expected Discharge Date:                  Expected Discharge Plan:  [place]  In-House Referral:     Discharge planning Services  CM Consult  Post Acute Care Choice:    Choice offered to:     DME Arranged:    DME Agency:     HH Arranged:  PT [place]:  [facility])(open with [facility])  Status of Service:  In process, will continue to follow  If discussed at Long Length of Stay Meetings, dates discussed:    Additional Comments:  [name], RN 10/25/2018, 3:07 PM

## 2018-10-25 NOTE — Progress Notes (Signed)
   10/25/18 1100  Clinical Encounter Type  Visited With Patient and family together  Visit Type Initial;Spiritual support  Referral From Chaplain  Consult/Referral To Chaplain  Spiritual Encounters  Spiritual Needs Prayer;Emotional  Stress Factors  Patient Stress Factors Other (Comment)  Family Stress Factors Other (Comment)  Chaplain was rounding and stop to visit patient. Family was at bedside. Chaplain introduced herself saying she was checking on patient to see if there is anything he needed. Chaplain offered prayer and patient stated he could always use prayer. Patient ask Chaplain could I tell them to give him a tylenol. Family stress their concerns about no one getting patient tylenol, they said it has been 2 hrs and nothing has happened. Chaplain comfort the family and spoke with nurse. Nurse said she was going to talk with patient. Chaplain informed patient and family that the nurse was on her way in. Family said thank you.

## 2018-10-25 NOTE — Consult Note (Signed)
Consultation Note Date: 10/25/2018   Patient Name: [name] Day  DOB: 03/12/1941  MRN: [id number]  Age / Sex: 78 y.o., male  PCP: Idelle Crouch, MD Referring Physician: Nicholes Mango, MD  Reason for Consultation: Establishing goals of care  HPI/Patient Profile: [name] Day  is a 78 y.o. male with a known history of nonalcoholic cirrhosis of liver, bipolar disorder, diabetes, dementia, hypertension, issues with memory, renal mass (not pursuing workup), history of a fib on oral anticoagulation. He has had several falls at home.   Clinical Assessment and Goals of Care: Patient is resting in bed. SLP coming in to work with him. Stepped out with son [name]. Son [name] was on speaker phone. The sons share Medical POA. [name] lives with son [name] and his wife.   He states his father is a man who likes to be waited on, and that whatever you will do for him, he will allow. [name] states he lacks motivation to do anything and has lost his will to live. He states things are tenuous with his brother as he wants to be involved in decision making but does not really come to visit or participate in his care.   He states cognitively, his father has good historical recollection. His short term memory is failing. He has trouble remembering names. He can still converse.    We discussed his diagnoses, prognosis, GOC, EOL wishes disposition and options. We discussed his renal mass. [name] had an appointment with oncology and refused to go to the appointment. He has DM and NASH. His albumin is 2.4. [name] states he eats what he wants, and that if it is carbs, he will eat it, and if it is protein, he will push it to the side. [name] is aware of the importance and function of protein. We discussed his esophageal varices, and use of NSAIDS for pain relief as narcotics make him confused.   The difference  between an aggressive medical intervention path and a comfort care path was discussed.  Values and goals of care important to patient and family were attempted to be elicited.  The sons are not sure what direction they wish to go in. We will remeet tomorrow at 1:30.       SUMMARY OF RECOMMENDATIONS   Remeet tomorrow at 1:30.   Prognosis:   Poor overall      Primary Diagnoses: Present on Admission: . GI bleed   I have reviewed the medical record, interviewed the patient and family, and examined the patient. The following aspects are pertinent.  Past Medical History:  Diagnosis Date  . Able to transfer from wheelchair to chair    per family  . Anemia   . Bipolar 1 disorder ([place])   . Cancer ([place])   . Carotid artery occlusion   . Cirrhosis ([name])   . Dementia ([name])   . Diabetes mellitus without complication (HCC)    diet controlled  . Diabetic peripheral neuropathy ([place]) 03/03/2018  . Gait abnormality 02/12/2017  . Hypertension   .  Left-sided Bell's palsy 03/03/2018  . Memory disorder 02/12/2017  . 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 Right renal mass   . Sleep apnea    non compliant with CPAP  . Stroke ([name]) 04/2018   left sided weakness  . TIA (transient ischemic attack)   . Wears dentures    partials   Social History   Socioeconomic History  . Marital status: Widowed    Spouse name: Not on file  . Number of children: 2  . Years of education: 42  . Highest education level: Not on file  Occupational History  . Occupation: Retired  Scientific laboratory technician  . Financial resource strain: Not on file  . Food insecurity:    Worry: Never true    Inability: Never true  . Transportation needs:    Medical: No    Non-medical: No  Tobacco Use  . Smoking status: Former Smoker    Packs/day: 1.00    Types: Cigarettes, Cigars    Last attempt to quit: 1985    Years since quitting: 35.1  .  Smokeless tobacco: Never Used  Substance and Sexual Activity  . Alcohol use: Not Currently    Comment: occasional  . Drug use: No  . Sexual activity: Not Currently  Lifestyle  . Physical activity:    Days per week: 1 day    Minutes per session: 10 min  . Stress: Not at all  Relationships  . Social connections:    Talks on phone: Patient refused    Gets together: Patient refused    Attends religious service: Patient refused    Active member of club or organization: Patient refused    Attends meetings of clubs or organizations: Patient refused    Relationship status: Patient refused  Other Topics Concern  . Not on file  Social History Narrative   Lives with [name]   Caffeine use: caffeine free soda daily   Right handed   Family History  Problem Relation Age of Onset  . Allergies Mother   . Diabetes Father   . Diabetes Sister    Scheduled Meds: . insulin aspart  0-15 Units Subcutaneous Q4H  . lactulose  10 g Oral BID  . mouth rinse  15 mL Mouth Rinse BID  . pantoprazole (PROTONIX) IV  40 mg Intravenous Q12H   Continuous Infusions: . cefTRIAXone (ROCEPHIN)  IV 2 g (10/25/18 1151)   PRN Meds:.ALPRAZolam, Gerhardt's butt cream, morphine injection, [DISCONTINUED] ondansetron **OR** ondansetron (ZOFRAN) IV, polyethylene glycol Medications Prior to Admission:  Prior to Admission medications   Medication Sig Start Date End Date Taking? Authorizing Provider  ALPRAZolam Duanne Moron) 0.5 MG tablet Take 0.5 mg by mouth at bedtime as needed for anxiety.   Yes [provider]  apixaban (ELIQUIS) 5 MG TABS tablet Take 5 mg by mouth 2 (two) times daily.    Yes [provider]  FREESTYLE LITE test strip 1 each by Other route as directed. 05/12/18  Yes Medina-Vargas, Monina C, NP  gabapentin (NEURONTIN) 100 MG capsule Take 2 capsules (200 mg total) by mouth 3 (three) times daily. Patient taking differently: Take 100 mg by mouth 2 (two) times daily.  05/12/18 05/12/19  Yes Medina-Vargas, Monina C, NP  lactulose (CHRONULAC) 10 GM/15ML solution Take 15 mLs (10 g total) by mouth daily. Patient taking differently: Take 10 g by mouth 2 (two) times daily.  06/02/18  Yes Salary, Montell D,  MD  lamoTRIgine (LAMICTAL) 25 MG tablet Take 2 tablets (50 mg total) by mouth 2 (two) times daily. Patient taking differently: Take 60 mg by mouth at bedtime.  05/12/18  Yes Medina-Vargas, Monina C, NP  Lurasidone HCl (LATUDA) 60 MG TABS Take 1 tablet (60 mg total) by mouth daily. Patient taking differently: Take 30 mg by mouth at bedtime.  05/12/18  Yes Medina-Vargas, Monina C, NP  metFORMIN (GLUCOPHAGE) 500 MG tablet Take 500 mg by mouth daily as needed (high blood sugar).   Yes [provider]  QUEtiapine (SEROQUEL) 25 MG tablet Take 25 mg by mouth at bedtime.   Yes [provider]  tamsulosin (FLOMAX) 0.4 MG CAPS capsule Take 1 capsule (0.4 mg total) by mouth 2 (two) times daily. Patient taking differently: Take 0.4 mg by mouth daily.  08/03/18  Yes Billey Co, MD  XIFAXAN 550 MG TABS tablet Take 550 mg by mouth every evening.  07/22/18  Yes [provider]  allopurinol (ZYLOPRIM) 300 MG tablet Take 1 tablet (300 mg total) by mouth daily as needed (gout). 05/12/18 05/12/19  Medina-Vargas, Monina C, NP  lidocaine (LIDODERM) 5 % Place 1 patch onto the skin daily. Remove & Discard patch within 12 hours or as directed by MD 06/01/18   Salary, Avel Peace, MD  liver oil-zinc oxide (DESITIN) 40 % ointment Apply topically 3 (three) times daily as needed for irritation. 06/01/18   Salary, Avel Peace, MD  NON FORMULARY Diet Type: NCS    [provider]  omeprazole (PRILOSEC) 40 MG capsule Take 1 capsule (40 mg total) by mouth daily. Patient not taking: Reported on 09/08/2018 05/12/18   Medina-Vargas, Monina C, NP   Allergies  Allergen Reactions  . Lisinopril Other (See Comments)    REACTION: Cough  . Penicillins Other (See Comments)    Reaction: unknown  childhood reaction Has patient had a PCN reaction causing immediate rash, facial/tongue/throat swelling, SOB or lightheadedness with hypotension: Unknown Has patient had a PCN reaction causing severe rash involving mucus membranes or skin necrosis: Unknown Has patient had a PCN reaction that required hospitalization: Unknown Has patient had a PCN reaction occurring within the last 10 years: Unknown If all of the above answers are "NO", then may proceed with Cephalosporin use.   . Clindamycin/Lincomycin Rash and Other (See Comments)    "turns me purple" "turns me orange"   Review of Systems  All other systems reviewed and are negative.   Physical Exam Pulmonary:     Effort: Pulmonary effort is normal.  Neurological:     Mental Status: He is alert.     Vital Signs: BP (!) 104/54   Pulse (!) 58   Temp (!) 97 F (36.1 C) (Axillary)   Resp 12   Ht 5\' 10"  (1.778 m)   Wt 101.6 kg   SpO2 99%   BMI 32.14 kg/m  Pain Scale: 0-10   Pain Score: 0-No pain   SpO2: SpO2: 99 % O2 Device:SpO2: 99 % O2 Flow Rate: .O2 Flow Rate (L/min): 2 L/min  IO: Intake/output summary:   Intake/Output Summary (Last 24 hours) at 10/25/2018 1431 Last data filed at 10/25/2018 1779 Gross per 24 hour  Intake 1419.89 ml  Output 1100 ml  Net 319.89 ml    LBM: Last BM Date: 10/25/18 Baseline Weight: Weight: 101.6 kg Most recent weight: Weight: 101.6 kg     Palliative Assessment/Data: NPO for swallow eval.      Time In: 2:15 Time Out: 2:50  Time Total: 35 min Greater than 50%  of this time was spent counseling and coordinating care related to the above assessment and plan.  Signed by: Asencion Gowda, NP   Please contact Palliative Medicine Team phone at [phone] for questions and concerns.  For individual provider: See Shea Evans

## 2018-10-25 NOTE — Progress Notes (Signed)
[name] Day , MD [address] [place], [place], [place], [address] [address] [place], [place], [place], [address] Phone: [phone]  Fax: [phone]   [name] Day is being followed for GI bleed   Subjective: Denies any hematemesis and states the stools are getting lighter in color.  Last bowel movement was at 1 AM this morning.  No confusion.   Objective: Vital signs in last 24 hours: Vitals:   10/25/18 0356 10/25/18 0400 10/25/18 0500 10/25/18 0600  BP:  (!) 126/49 (!) 113/51 (!) 104/54  Pulse:  62 (!) 58   Resp:  14 14 12   Temp: (!) 97 F (36.1 C)     TempSrc: Axillary     SpO2:  92% 99%   Weight:      Height:       Weight change:   Intake/Output Summary (Last 24 hours) at 10/25/2018 0853 Last data filed at 10/25/2018 7322 Gross per 24 hour  Intake 1769.89 ml  Output 1325 ml  Net 444.89 ml     Exam: Heart:: Regular rate and rhythm, S1S2 present or without murmur or extra heart sounds Lungs: normal, clear to auscultation and clear to auscultation and percussion Abdomen: soft, nontender, normal bowel sounds  CBC Latest Ref Rng & Units 10/25/2018 10/24/2018 10/24/2018  WBC 4.0 - 10.5 K/uL 6.2 - -  Hemoglobin 13.0 - 17.0 g/dL 7.1(L) 7.5(L) 7.6(L)  Hematocrit 39.0 - 52.0 % 21.4(L) - -  Platelets 150 - 400 K/uL 114(L) - -    Lab Results: @LABTEST2 @ Micro Results: Recent Results (from the past 240 hour(s))  MRSA PCR Screening     Status: None   Collection Time: 10/23/18  2:21 PM  Result Value Ref Range Status   MRSA by PCR NEGATIVE NEGATIVE Final    Comment:        The GeneXpert MRSA Assay (FDA approved for NASAL specimens only), is one component of a comprehensive MRSA colonization surveillance program. It is not intended to diagnose MRSA infection nor to guide or monitor treatment for MRSA infections. Performed at [facility], [address] [place], [place] [address]    Studies/Results: Ct Head Wo Contrast  Result  Date: 10/23/2018 CLINICAL DATA:  Patient fell 3 times yesterday. EXAM: CT HEAD WITHOUT CONTRAST CT CERVICAL SPINE WITHOUT CONTRAST TECHNIQUE: Multidetector CT imaging of the head and cervical spine was performed following the standard protocol without intravenous contrast. Multiplanar CT image reconstructions of the cervical spine were also generated. COMPARISON:  Head CT 07/31/2018. FINDINGS: CT HEAD FINDINGS Brain: There is no evidence for acute hemorrhage, hydrocephalus, mass lesion, or abnormal extra-axial fluid collection. No definite CT evidence for acute infarction. Diffuse loss of parenchymal volume is consistent with atrophy. Patchy low attenuation in the deep hemispheric and periventricular white matter is nonspecific, but likely reflects chronic microvascular ischemic demyelination. 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 Vascular: No hyperdense vessel or unexpected calcification. Skull: No evidence for fracture. No worrisome lytic or sclerotic lesion. Sinuses/Orbits: Chronic opacification of the right maxillary sinus and right mastoid air cells. Opacification of the right sphenoid sinus is new since prior study. Other: None. CT CERVICAL SPINE FINDINGS Alignment: Straightening of normal cervical lordosis evident. Trace anterolisthesis of C3 on 4 likely related to associated facet disease. Skull base and vertebrae: No acute fracture. No primary bone lesion or focal pathologic process. Soft tissues and spinal canal: No prevertebral fluid or swelling. No visible canal hematoma. Disc levels: Loss of disc height  with endplate degeneration evident at C5-6 and C6-7. Upper cervical facet degeneration noted bilaterally. Upper chest: Negative. Other: None. IMPRESSION: 1. No acute intracranial abnormality. 2. Atrophy with chronic small vessel white matter ischemic disease. 3. Chronic right maxillary and ethmoid sinus disease. Opacification of the right sphenoid sinus is new since prior study. 4.  Degenerative changes in the cervical spine without fracture. 5. Loss of cervical lordosis. This can be related to patient positioning, muscle spasm or soft tissue injury. Electronically Signed   By: Misty Stanley M.D.   On: 10/23/2018 13:23   Ct Cervical Spine Wo Contrast  Result Date: 10/23/2018 CLINICAL DATA:  Patient fell 3 times yesterday. EXAM: CT HEAD WITHOUT CONTRAST CT CERVICAL SPINE WITHOUT CONTRAST TECHNIQUE: Multidetector CT imaging of the head and cervical spine was performed following the standard protocol without intravenous contrast. Multiplanar CT image reconstructions of the cervical spine were also generated. COMPARISON:  Head CT 07/31/2018. FINDINGS: CT HEAD FINDINGS Brain: There is no evidence for acute hemorrhage, hydrocephalus, mass lesion, or abnormal extra-axial fluid collection. No definite CT evidence for acute infarction. Diffuse loss of parenchymal volume is consistent with atrophy. Patchy low attenuation in the deep hemispheric and periventricular white matter is nonspecific, but likely reflects chronic microvascular ischemic demyelination. 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 Vascular: No hyperdense vessel or unexpected calcification. Skull: No evidence for fracture. No worrisome lytic or sclerotic lesion. Sinuses/Orbits: Chronic opacification of the right maxillary sinus and right mastoid air cells. Opacification of the right sphenoid sinus is new since prior study. Other: None. CT CERVICAL SPINE FINDINGS Alignment: Straightening of normal cervical lordosis evident. Trace anterolisthesis of C3 on 4 likely related to associated facet disease. Skull base and vertebrae: No acute fracture. No primary bone lesion or focal pathologic process. Soft tissues and spinal canal: No prevertebral fluid or swelling. No visible canal hematoma. Disc levels: Loss of disc height with endplate degeneration evident at C5-6 and C6-7. Upper cervical facet degeneration noted  bilaterally. Upper chest: Negative. Other: None. IMPRESSION: 1. No acute intracranial abnormality. 2. Atrophy with chronic small vessel white matter ischemic disease. 3. Chronic right maxillary and ethmoid sinus disease. Opacification of the right sphenoid sinus is new since prior study. 4. Degenerative changes in the cervical spine without fracture. 5. Loss of cervical lordosis. This can be related to patient positioning, muscle spasm or soft tissue injury. Electronically Signed   By: Misty Stanley M.D.   On: 10/23/2018 13:23   Dg Chest Port 1 View  Result Date: 10/23/2018 CLINICAL DATA:  Intubation after endoscopy. EXAM: PORTABLE CHEST 1 VIEW COMPARISON:  07/31/2018 FINDINGS: 1532 hours. Endotracheal tube tip is 1.4 cm above the base the carina. Low lung volumes. The cardio pericardial silhouette is enlarged. Left base atelectasis noted. No edema or focal airspace consolidation. No substantial pleural effusion. Interstitial markings are diffusely coarsened with chronic features. Left permanent pacemaker again noted. Telemetry leads overlie the chest. Prominent gastric bubble. IMPRESSION: 1. Endotracheal tube tip 1.4 cm above the base of the carina. 2. Low volume film without left base atelectasis. 3. Prominent gastric bubble. Electronically Signed   By: Misty Stanley M.D.   On: 10/23/2018 17:33   Medications: I have reviewed the patient's current medications. Scheduled Meds: . insulin aspart  0-15 Units Subcutaneous Q4H  . lactulose  10 g Oral BID  . mouth rinse  15 mL Mouth Rinse BID  . [START ON 10/26/2018] pantoprazole  40 mg Intravenous Q12H   Continuous Infusions: . ciprofloxacin  Stopped (10/25/18 0500)  . octreotide  (SANDOSTATIN)    IV infusion 50 mcg/hr (10/25/18 0501)  . pantoprozole (PROTONIX) infusion 8 mg/hr (10/25/18 0636)   PRN Meds:.ALPRAZolam, [DISCONTINUED] ondansetron **OR** ondansetron (ZOFRAN) IV, polyethylene glycol   Assessment: Active Problems:   GI bleed   Secondary  esophageal varices with bleeding (HCC)   Acute gastrointestinal hemorrhage   Acute blood loss anemia  [name] 78 y.o. male underwent an upper endoscopy on 10/24/2018 for upper GI bleed and had esophageal varices that were banded.  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  Hemo-globin stable  Plan: 1.  Continue octreotide for 72 hours and stop. 2.  Heart rate noted to be 60 bpm.  If the heart rate goes higher and is sustained then would require to be commenced on nadolol 20 mg with a target heart rate of between 60 to 65 bpm as long as he tolerates it. 3.  Repeat EGD in 2 to 3 weeks time for banding of esophageal varices. 4.  CT scan of the abdomen 07/21/2018 shows 7.4 cm right renal mass with central necrosis consistent renal cell carcinoma.  This would require follow-up with oncology/surgery if already not done so. 5.  Continue and complete 5 days of ceftriaxone or ciprofloxacin for SBP prophylaxis in the setting of a GI bleed. 6.  No NSAIDs. 7.  No indication to check ammonia levels.  Hepatic encephalopathy is a clinical diagnosis.  An normal level will not rule out hepatic encephalopathy. 8.  Can commence on a soft diet. 9.  In terms of analgesia I would suggest to avoid narcotics since the family mentioned that he gets very confused even with a dose of an opioid.  I would not suggest NSAIDs as he has had banding and it could cause ulceration and bleeding from the site of banding.  Probably our only option is to try very low-dose Tylenol probably limiting to 325 mg/day. 10.  Low-salt diet have discussed in depth with the family and the patient.   LOS: 2 days   [name] Bellows, MD 10/25/2018, 8:53 AM

## 2018-10-25 NOTE — Progress Notes (Signed)
[facility] [place] NAME: [name] Day    MR#:  [id number]  DATE OF BIRTH:  Dec 30, 1940  SUBJECTIVE:  CHIEF COMPLAINT: Patient is extubated, more alert today son and daughter-in-law at bedside ok with palliative care consult  REVIEW OF SYSTEMS:  CONSTITUTIONAL: No fever, fatigue, endorses weakness.  EYES: No blurred or double vision.  EARS, NOSE, AND THROAT: No tinnitus or ear pain.  RESPIRATORY: No cough, shortness of breath, wheezing or hemoptysis.  CARDIOVASCULAR: No chest pain, orthopnea, edema.  GASTROINTESTINAL: No nausea, vomiting, diarrhea or abdominal pain.  HEMATOLOGY: No anemia, easy bruising or bleeding SKIN: No rash or lesion. MUSCULOSKELETAL: No joint pain or arthritis.   NEUROLOGIC: No tingling, numbness, weakness.  PSYCHIATRY: No anxiety or depression.   DRUG ALLERGIES:   Allergies  Allergen Reactions  . Lisinopril Other (See Comments)    REACTION: Cough  . Penicillins Other (See Comments)    Reaction: unknown childhood reaction Has patient had a PCN reaction causing immediate rash, facial/tongue/throat swelling, SOB or lightheadedness with hypotension: Unknown Has patient had a PCN reaction causing severe rash involving mucus membranes or skin necrosis: Unknown Has patient had a PCN reaction that required hospitalization: Unknown Has patient had a PCN reaction occurring within the last 10 years: Unknown If all of the above answers are "NO", then may proceed with Cephalosporin use.   . Clindamycin/Lincomycin Rash and Other (See Comments)    "turns me purple" "turns me orange"    VITALS:  Blood pressure 119/64, pulse (!) 59, temperature 97.6 F (36.4 C), temperature source Axillary, resp. rate 14, height 5' 10"  (1.778 m), weight 101.6 kg, SpO2 100 %.  PHYSICAL EXAMINATION:  GENERAL:  78 y.o.-year-old patient lying in the bed with no acute distress.  EYES: Pupils equal, round, reactive to light and  accommodation. No scleral icterus. Extraocular muscles intact.  HEENT: Head atraumatic, normocephalic. Oropharynx and nasopharynx clear.  NECK:  Supple, no jugular venous distention. No thyroid enlargement, no tenderness.  LUNGS: Normal breath sounds bilaterally, no wheezing, rales,rhonchi or crepitation. No use of accessory muscles of respiration.  CARDIOVASCULAR: S1, S2 normal. No murmurs, rubs, or gallops.  ABDOMEN: Soft, nontender, nondistended. Bowel sounds present. No organomegaly or mass.  EXTREMITIES: No pedal edema, cyanosis, or clubbing.  NEUROLOGIC: Awake, alert and oriented x1-2 sensation intact. Gait not checked.  PSYCHIATRIC: The patient is alert .  SKIN: No obvious rash, lesion, or ulcer.    LABORATORY PANEL:   CBC Recent Labs  Lab 10/25/18 0420 10/25/18 1437  WBC 6.2  --   HGB 7.1* 8.2*  HCT 21.4*  --   PLT 114*  --    ------------------------------------------------------------------------------------------------------------------  Chemistries  Recent Labs  Lab 10/25/18 0420  NA 140  K 4.1  CL 113*  CO2 21*  GLUCOSE 178*  BUN 75*  CREATININE 1.18  CALCIUM 7.9*  AST 30  ALT 16  ALKPHOS 57  BILITOT 3.7*   ------------------------------------------------------------------------------------------------------------------  Cardiac Enzymes Recent Labs  Lab 10/23/18 1144  TROPONINI <0.03   ------------------------------------------------------------------------------------------------------------------  RADIOLOGY:  Dg Chest Port 1 View  Result Date: 10/23/2018 CLINICAL DATA:  Intubation after endoscopy. EXAM: PORTABLE CHEST 1 VIEW COMPARISON:  07/31/2018 FINDINGS: 1532 hours. Endotracheal tube tip is 1.4 cm above the base the carina. Low lung volumes. The cardio pericardial silhouette is enlarged. Left base atelectasis noted. No edema or focal airspace consolidation. No substantial pleural effusion. Interstitial markings are diffusely coarsened with  chronic features. Left permanent pacemaker again  noted. Telemetry leads overlie the chest. Prominent gastric bubble. IMPRESSION: 1. Endotracheal tube tip 1.4 cm above the base of the carina. 2. Low volume film without left base atelectasis. 3. Prominent gastric bubble. Electronically Signed   By: Misty Stanley M.D.   On: 10/23/2018 17:33    EKG:   Orders placed or performed during the hospital encounter of 10/23/18  . ED EKG  . ED EKG    ASSESSMENT AND PLAN:     [name] Day  is a 78 y.o. male with a known history of nonalcoholic cirrhosis of liver, bipolar disorder, diabetes, dementia, hypertension, issues with memory, renal mass (not pursuing workup), history of a fib on oral anticoagulation, history of AV block status post pacemaker and sleep apnea comes to the emergency room due to frequent falls at home last night.   1. G.I. bleed/hematemesis in the setting of nonalcoholic cirrhosis of liver suspect variceal bleed versus rule out peptic ulcer disease -IV fluids --IV Protonix drip, IV Octreotide drip -Rocephin for SBP prophylaxis -GI did endoscopy which has revealed grade 3 esophageal varices incomplete eradication, banding done -transfuse one unit of blood transfusion, hemoglobin Q8 hours transfuse as needed -spoke with Dr. Allen Norris to see patient -His hemoglobin 2 1/2 weeks ago was 11.4--hemoglobin today 7.7--1 unit PRBC--hgb-6.8--7.1--8.2   2. Chronic atrial fibrillation on eliquis  -hold oral anticoagulation  3. Nonalcoholic cirrhosis of liver -this was diagnosed about 3 to 6 months ago -follows with Dr. Ricky Stabs outpatient -patient has chronically elevated bilirubin and chronically elevated ammonia -xifaxan -Rocephin for SBP prophylaxis and patient is on cephalexin  4. Frequent falls -physical therapy to see patient after G.I. bleed results  5. Type II diabetes -sliding scale insulin  6. Bipolar disorder -since patient is NPO  holding home meds   7.   Failure to thrive with poor prognosis Palliative care consult placed as requested by son and daughter-in-law at bedside.  Crystal NP met the family and will meet them again tomorrow at 130   All the records are reviewed and case discussed with Care Management/Social Workerr. Management plans discussed with the patient, son [name] and daughter-in-law Lattie Haw and they are in agreement.  CODE STATUS: dnr   TOTAL TIME TAKING CARE OF THIS PATIENT: 36  minutes.   POSSIBLE D/C IN 2-3 DAYS, DEPENDING ON CLINICAL CONDITION.  Note: This dictation was prepared with Dragon dictation along with smaller phrase technology. Any transcriptional errors that result from this process are unintentional.   Nicholes Mango M.D on 10/25/2018 at 3:41 PM  Between 7am to 6pm - Pager - [phone] After 6pm go to [email or web address] - password [email or web address] [facility]  Office  [phone]  CC: Primary care physician; Idelle Crouch, MD

## 2018-10-25 NOTE — Evaluation (Addendum)
Clinical/Bedside Swallow Evaluation Patient Details  Name: [name] Day MRN: [id number] Date of Birth: 1941/03/21  Today's Date: 10/25/2018 Time:        Past Medical History:  Past Medical History:  Diagnosis Date  . Able to transfer from wheelchair to chair    per family  . Anemia   . Bipolar 1 disorder ([place])   . Cancer ([place])   . Carotid artery occlusion   . Cirrhosis ([place])   . Dementia ([place])   . Diabetes mellitus without complication (HCC)    diet controlled  . Diabetic peripheral neuropathy ([place]) 03/03/2018  . Gait abnormality 02/12/2017  . Hypertension   . Left-sided Bell's palsy 03/03/2018  . Memory disorder 02/12/2017  . Presence of permanent cardiac pacemaker 04/25/2016   Medtronic Advisa Dr, [id number], Serial #: [id number]  placed at [facility]  . Right renal mass   . Sleep apnea    non compliant with CPAP  . Stroke ([place]) 04/2018   left sided weakness  . TIA (transient ischemic attack)   . Wears dentures    partials   Past Surgical History:  Past Surgical History:  Procedure Laterality Date  . CATARACT EXTRACTION W/PHACO Left 09/15/2018   Procedure: CATARACT EXTRACTION PHACO AND INTRAOCULAR LENS PLACEMENT ([place])  COMPLICATED LEFT DIABETIC;  Surgeon: Leandrew Koyanagi, MD;  Location: [facility] [facility];  Service: Ophthalmology;  Laterality: Left;  Diabetic - diet controlled sleep apnea  . COLONOSCOPY WITH PROPOFOL N/A 02/25/2017   Procedure: COLONOSCOPY WITH PROPOFOL;  Surgeon: Manya Silvas, MD;  Location[facility] ENDOSCOPY;  Service: Endoscopy;  Laterality: N/A;  . ESOPHAGOGASTRODUODENOSCOPY N/A 10/23/2018   Procedure: ESOPHAGOGASTRODUODENOSCOPY (EGD);  Surgeon: [name], MD;  Location: [facility] ENDOSCOPY;  Service: Endoscopy;  Laterality: N/A;  . ESOPHAGOGASTRODUODENOSCOPY (EGD) WITH PROPOFOL N/A 02/25/2017   Procedure: ESOPHAGOGASTRODUODENOSCOPY (EGD) WITH PROPOFOL;  Surgeon: Manya Silvas, MD;  Location: [facility] ENDOSCOPY;  Service: Endoscopy;  Laterality: N/A;  .  NASAL SINUS SURGERY    . PACEMAKER PLACEMENT    . TONSILLECTOMY    . TONSILLECTOMY     HPI:  Per admitting H&P: pt  is a 78 y.o. male with a known history of nonalcoholic cirrhosis of liver, bipolar disorder, diabetes, dementia, hypertension, issues with memory, renal mass (not pursuing workup), history of a fib on oral anticoagulation, history of AV block status post pacemaker and sleep apnea comes to the emergency room due to frequent falls at home last night. Daughter said he felt about a week ago when he tripped over shoes. Last night had three falls without any trauma. He is been complaining of right lower back pain from fall. Patient has been having Alina unsure how long has been going on. He has had one episode of G.I. bleed in the past.    Assessment / Plan / Recommendation Clinical Impression  This cooperative pt appears to present w/ adequate oropharyngeal swallowing function w/ reduced risk for aspiration when following general aspiration precautions. ST administered OM exam which revealed adequate lip, tongue, cheek, and jaw strength despite noted R facial droop. Pt does have a baseline history of Bell's Palsy, per chart H&P. Pt has upper dentures and adequate natural bottom dentition for eating.  ST gave pt ice chip via spoon - pt exhibited WFL lip closure around spoon and appropriately manipulated and chewed ice chip w/ no immediate, overt s/s of aspiration.  When given Cup of thin liquid (water) to self-feed, pt appeared to be min clumsy/unfamiliar w/ task and did not close lips  around Cup rim until water was near the back of the mouth for natural swallow trigger - when asked, pt reported he typically drinks w/ a straw at home. Pt attended more naturally to drinking task w/ Straw and took 3-4 sips of water (~2 oz) min impulsively - ST pinched straw and pulled away for general aspiration safety precautions. ST did not note any immediate overt s/s of aspiration w/ PO trials of thin liquids.  Additionally, pt appeared to demonstrate fair control of thin liquid PO trial when holding cup. Pt did exhibit min delayed throat clearing x1 during BSE. ST fed pt 3-4 bites ice cream Via spoon w/ WFL labial sweeping on spoon and appropriate PO manipulation, as well as timely A-P transfer. Pt's voice remained clear and O2 sats remained normal.  Pt was fed 3-4 tsp Puree (applesauce) via Spoon and demonstrated adequate bolus manipulation and timely A-P transfer w/ no immediate overt s/s of aspiration noted. Same was noted w/ puree solid (applesauce w/ graham cracker) - pt demonstrated [facility] rotary chewing and A-P transfer, as well as lingual sweeping and oral clearing. Pt self-fed a small piece of graham cracker and demonstrated WFL rotary chewing and adequate clearing between trials. However, pt did begin to try feeding self another piece of graham cracker w/o anything in his hand, demonstrating min confusion w/ task, requiring verbal cues (baseline Dementia). When asked, pt agreed he would like for meats to have more moisture and be cut for easier chewing/swallowing at meal times. ST discussed importance of allowing pt to hold cup to attend to feeding task while drinking, however he does require FULL assist for feeding. Pt and family member (son) educated re: general aspiration precautions- son and NSG agreed. ST to f/u tomorrow re: toleration of diet.  Recommend: Dysphagia III (MECHANICAL soft) w/ extra butters, gravies, sauces for cohesion and easier chewing w/ THINS via Straw - Encourage pt to HOLD CUP to attend to drinking task. Full assistance for feeding. Medications Whole in Puree if needed for easier swallowing.  SLP Visit Diagnosis: Dysphagia, unspecified (R13.10)    Aspiration Risk  Mild aspiration risk, reduced risk following general aspiration precautions.   Diet Recommendation   Dysphagia III (MECHANICAL soft) w/ extra butters, gravies, sauces for cohesion and easier chewing w/ THINS via Straw.    Medication Administration: Whole meds with puree    Other  Recommendations Recommended Consults: Other (Comment)(Dietician consult) Oral Care Recommendations: Oral care BID;Oral care before and after PO   Follow up Recommendations Skilled Nursing facility(TBD)      Frequency and Duration min 1 x/week  1 week       Prognosis Prognosis for Safe Diet Advancement: Fair Barriers to Reach Goals: Cognitive deficits;Severity of deficits      Swallow Study   General Date of Onset: 10/23/18 HPI: Per admitting H&P: pt  is a 78 y.o. male with a known history of nonalcoholic cirrhosis of liver, bipolar disorder, diabetes, dementia, hypertension, issues with memory, renal mass (not pursuing workup), history of a fib on oral anticoagulation, history of AV block status post pacemaker and sleep apnea comes to the emergency room due to frequent falls at home last night. Daughter said he felt about a week ago when he tripped over shoes. Last night had three falls without any trauma. He is been complaining of right lower back pain from fall. Patient has been having Alina unsure how long has been going on. He has had one episode of G.I. bleed in the past.  Type of Study: Bedside Swallow Evaluation Diet Prior to this Study: NPO Respiratory Status: Room air History of Recent Intubation: No Behavior/Cognition: Alert;Cooperative;Pleasant mood;Impulsive;Distractible;Requires cueing Oral Cavity Assessment: Within Functional Limits Oral Care Completed by SLP: No Oral Cavity - Dentition: Dentures, top(natural bottom dentition) Vision: Functional for self-feeding Self-Feeding Abilities: Total assist(can hold cup while drinking from straw) Patient Positioning: Upright in bed Baseline Vocal Quality: Normal;Low vocal intensity    Oral/Motor/Sensory Function Overall Oral Motor/Sensory Function: Generalized oral weakness Facial ROM: Within Functional Limits Facial Symmetry: Abnormal symmetry right Facial  Strength: Within Functional Limits Facial Sensation: Within Functional Limits Lingual ROM: Within Functional Limits Lingual Symmetry: Within Functional Limits Lingual Strength: Within Functional Limits Lingual Sensation: Within Functional Limits Velum: Within Functional Limits Mandible: Within Functional Limits   Ice Chips Ice chips: Within functional limits Presentation: Spoon   Thin Liquid Thin Liquid: Within functional limits Presentation: Cup;Self Fed;Straw Other Comments: needs set up assistance    Nectar Thick Nectar Thick Liquid: Not tested   Honey Thick Honey Thick Liquid: Not tested   Puree Puree: Within functional limits Presentation: Spoon   Solid     Solid: Within functional limits Presentation: Annice Needy, Graduate Student SLP 10/25/2018,4:14 PM

## 2018-10-25 NOTE — Progress Notes (Signed)
Physical Therapy Evaluation Patient Details Name: TORENCE PALMERI MRN: [id number] DOB: [birth year] Today's Date: 10/25/2018   History of Present Illness  Pt is 78 y.o male who presented to the ED with 3 falls and increased confusion. Head/spine CT showed no acute infarcts. Pt was found to had 7.1 Hbg and was transfused. Pt was intubated and EGD was performed. EGD showed grade 3 varices along with excess blood in stomach and esophagus. Varices were banded with plans to perform another EGD in 2-3 weeks to complete bandings. Pt with PMHx of TIA, CVA, permanent pacemaker, bells palsy, HTN, DM, neuropathy, dementia, ca rotid artery occlusion, lung CA, biopolar, A-fib, and renal mass.     Clinical Impression  Mr. Dessert presents with the above and has the following deficits listed below (see PT problem list). PTA pt son who was present for evaluation reports pt was ambulating house hold distances as well as independent with minimal ADLs. Pt son endorses HHPT is already working with Mr. Havlin. Min A for trunk support with sup>sit. Initial STS Mod A x2 provided with use of RW. Second STS Min A- light Mod A x2 provided with RW. Min A-Mod A x2 for ambulating to recliner. With gait pt cued on weight shifting, hand placement and RW management. Pt will continue to benefit from skilled HHPT to address his muscle weakness, balance impairments, limited activity tolerance, and to increase independence.     Follow Up Recommendations Home health PT    Equipment Recommendations  None recommended by PT    Recommendations for Other Services       Precautions / Restrictions Precautions Precautions: Fall Restrictions Weight Bearing Restrictions: No      Mobility  Bed Mobility Overal bed mobility: Needs Assistance Bed Mobility: Supine to Sit     Supine to sit: Min assist     General bed mobility comments: min A provided for trunk support.   Transfers Overall transfer level: Needs  assistance Equipment used: Rolling walker (2 wheeled) Transfers: Sit to/from Stand Sit to Stand: Mod assist;+2 physical assistance         General transfer comment: First STS Mod A x2. Second STS min A-light Mod A. Verbal and tactile cues for hand placement.  Ambulation/Gait Ambulation/Gait assistance: Mod assist;+2 physical assistance Gait Distance (Feet): 4 Feet Assistive device: Rolling walker (2 wheeled) Gait Pattern/deviations: Step-to pattern;Decreased step length - right;Decreased step length - left;Decreased dorsiflexion - left;Decreased dorsiflexion - right Gait velocity: decreased    General Gait Details: Single step verbal and tactile cues provided for hand placement and RW management.   Stairs            Wheelchair Mobility    Modified Rankin (Stroke Patients Only)       Balance Overall balance assessment: Needs assistance Sitting-balance support: Bilateral upper extremity supported;Feet supported Sitting balance-Leahy Scale: Fair Sitting balance - Comments: Pt able to maintain static balance but demonstrates limited willingness to move outside of his BOS.   Standing balance support: Bilateral upper extremity supported;During functional activity Standing balance-Leahy Scale: Poor Standing balance comment: BUE support of RW required during static and dynamic activity.                              Pertinent Vitals/Pain Pain Assessment: No/denies pain(no signs of pain)    Home Living Family/patient expects to be discharged to:: Private residence Living Arrangements: Children Available Help at Discharge: Family;Available 24 hours/day(son and daughter in  law ) Type of Home: House Home Access: Ramped entrance     Home Layout: Two level;Able to live on main level with bedroom/bathroom Home Equipment: Walker - 4 wheels;Bedside commode;Shower seat - built in;Grab bars - toilet;Grab bars - tub/shower      Prior Function Level of Independence:  Needs assistance   Gait / Transfers Assistance Needed: Per pt son PTA he was able to ambulate 22ft with rollator. Pt is no longer driving and has had multiple falls in the past 6 months.   ADL's / Homemaking Assistance Needed: Per pt son pt required full assistance for bathing, cooking and cleaning. Pt was able to dress and use the restroom independently.         Hand Dominance   Dominant Hand: Right    Extremity/Trunk Assessment   Upper Extremity Assessment Upper Extremity Assessment: RUE deficits/detail;LUE deficits/detail RUE Deficits / Details: Strength grossly 3/5. Pt able to flex shoulder to ~110 degrees. LUE Deficits / Details: Strength grossly 3/5. Pt able to flex shoulder to ~110 degrees.    Lower Extremity Assessment Lower Extremity Assessment: RLE deficits/detail;LLE deficits/detail RLE Deficits / Details: Strength grossly a 3-/5. Pt able to perform 3 in SLR and limited heel slide.  LLE Deficits / Details: Strength grossly a 3-/5. Pt able to perform 3 in SLR and limited heel slide.        Communication   Communication: No difficulties  Cognition Arousal/Alertness: Awake/alert Behavior During Therapy: Flat affect;WFL for tasks assessed/performed Overall Cognitive Status: History of cognitive impairments - at baseline                                 General Comments: Son in room upon evaluation and indicated he was slightly impaired from his baseline but typically confused. Of note pt became emotional 2 times but was able to regain composure with reassurance.       General Comments      Exercises     Assessment/Plan    PT Assessment Patient needs continued PT services  PT Problem List Decreased strength;Decreased range of motion;Decreased activity tolerance;Decreased balance;Decreased mobility;Decreased cognition;Decreased knowledge of use of DME;Decreased safety awareness;Decreased knowledge of precautions       PT Treatment Interventions DME  instruction;Gait training;Functional mobility training;Therapeutic activities;Therapeutic exercise;Balance training;Neuromuscular re-education;Cognitive remediation;Patient/family education;Wheelchair mobility training    PT Goals (Current goals can be found in the Care Plan section)  Acute Rehab PT Goals Patient Stated Goal: Per pt son to go home  PT Goal Formulation: With family Time For Goal Achievement: 11/08/18 Potential to Achieve Goals: Fair    Frequency Min 2X/week   Barriers to discharge        Co-evaluation               AM-PAC PT "6 Clicks" Mobility  Outcome Measure Help needed turning from your back to your side while in a flat bed without using bedrails?: A Little Help needed moving from lying on your back to sitting on the side of a flat bed without using bedrails?: A Little Help needed moving to and from a bed to a chair (including a wheelchair)?: A Lot Help needed standing up from a chair using your arms (e.g., wheelchair or bedside chair)?: A Lot Help needed to walk in hospital room?: A Little Help needed climbing 3-5 steps with a railing? : A Lot 6 Click Score: 15    End of Session Equipment Utilized During  Treatment: Gait belt Activity Tolerance: Patient tolerated treatment well Patient left: in chair;with call bell/phone within reach;with family/visitor present Nurse Communication: Mobility status;Other (comment)(voiding) PT Visit Diagnosis: Unsteadiness on feet (R26.81);Other abnormalities of gait and mobility (R26.89);Muscle weakness (generalized) (M62.81);Repeated falls (R29.6);History of falling (Z91.81)    Time: 1388-7195 PT Time Calculation (min) (ACUTE ONLY): 36 min   Charges:              Dorothy Spark, SPT  10/25/2018, 3:57 PM

## 2018-10-26 LAB — GLUCOSE, CAPILLARY
GLUCOSE-CAPILLARY: 158 mg/dL — AB (ref 70–99)
Glucose-Capillary: 139 mg/dL — ABNORMAL HIGH (ref 70–99)
Glucose-Capillary: 146 mg/dL — ABNORMAL HIGH (ref 70–99)
Glucose-Capillary: 154 mg/dL — ABNORMAL HIGH (ref 70–99)
Glucose-Capillary: 181 mg/dL — ABNORMAL HIGH (ref 70–99)
Glucose-Capillary: 193 mg/dL — ABNORMAL HIGH (ref 70–99)

## 2018-10-26 LAB — URINE CULTURE: Culture: <10000 — AB

## 2018-10-26 LAB — HEMOGLOBIN
Hemoglobin: 7.3 g/dL — ABNORMAL LOW (ref 13.0–17.0)
Hemoglobin: 7.7 g/dL — ABNORMAL LOW (ref 13.0–17.0)
Hemoglobin: 8.2 g/dL — ABNORMAL LOW (ref 13.0–17.0)

## 2018-10-26 NOTE — Progress Notes (Signed)
Patient lethargic most of shift, requiring stimulation to arouse for meals and medications. Family at bedside and aware. Family stating may be due to night medications that were withheld previously during the admission. Madlyn Frankel, RN

## 2018-10-26 NOTE — Progress Notes (Signed)
[name] Day , MD [address] [place], [place], [place], [address] [id number] [address] [place], [place], [place], [address] Phone: [phone]  Fax: [phone]   [name] Day is being followed for variceal bleed   Subjective: No hematemesis, had a bowel movement which was less dark   Objective: Vital signs in last 24 hours: Vitals:   10/25/18 1600 10/25/18 1810 10/25/18 2037 10/26/18 0405  BP: (!) 111/55 (!) 116/47 130/85 (!) 119/56  Pulse: (!) 59 (!) 59 60 60  Resp: 13 20 19 18   Temp: 97.9 F (36.6 C) 98.3 F (36.8 C) 97.9 F (36.6 C) 97.8 F (36.6 C)  TempSrc: Axillary Oral  Axillary  SpO2: 99% 100% 98% 99%  Weight:      Height:       Weight change:   Intake/Output Summary (Last 24 hours) at 10/26/2018 1040 Last data filed at 10/26/2018 8786 Gross per 24 hour  Intake 736.93 ml  Output 575 ml  Net 161.93 ml     Exam: Heart:: Regular rate and rhythm, S1S2 present or without murmur or extra heart sounds Lungs: normal, clear to auscultation and clear to auscultation and percussion Abdomen: soft, nontender, normal bowel sounds   Lab Results: @LABTEST2 @ Micro Results: Recent Results (from the past 240 hour(s))  MRSA PCR Screening     Status: None   Collection Time: 10/23/18  2:21 PM  Result Value Ref Range Status   MRSA by PCR NEGATIVE NEGATIVE Final    Comment:        The GeneXpert MRSA Assay (FDA approved for NASAL specimens only), is one component of a comprehensive MRSA colonization surveillance program. It is not intended to diagnose MRSA infection nor to guide or monitor treatment for MRSA infections. Performed at [facility], [address], [place], [place] [address]   Culture, Urine     Status: Abnormal   Collection Time: 10/25/18 12:29 AM  Result Value Ref Range Status   Specimen Description   Final    URINE, RANDOM Performed at [facility], [address] [place], [place] [address]    Special Requests    Final    NONE Performed at [facility], [address] [place], [place] [address]    Culture (A)  Final    <10,000 COLONIES/mL INSIGNIFICANT GROWTH Performed at [facility] [address], [place], [place] [address]    Report Status 10/26/2018 FINAL  Final   Studies/Results: No results found. Medications: I have reviewed the patient's current medications. Scheduled Meds: . gabapentin  100 mg Oral BID  . insulin aspart  0-15 Units Subcutaneous Q4H  . lactulose  10 g Oral BID  . lamoTRIgine  62.5 mg Oral QHS  . lurasidone  60 mg Oral QHS  . mouth rinse  15 mL Mouth Rinse BID  . pantoprazole (PROTONIX) IV  40 mg Intravenous Q12H  . QUEtiapine  25 mg Oral QHS  . rifaximin  550 mg Oral QPM  . tamsulosin  0.4 mg Oral QPC breakfast   Continuous Infusions: . cefTRIAXone (ROCEPHIN)  IV 2 g (10/25/18 1151)   PRN Meds:.acetaminophen, ALPRAZolam, Gerhardt's butt cream, morphine injection, [DISCONTINUED] ondansetron **OR** ondansetron (ZOFRAN) IV, polyethylene glycol  CBC Latest Ref Rng & Units 10/26/2018 10/25/2018 10/25/2018  WBC 4.0 - 10.5 K/uL - - -  Hemoglobin 13.0 - 17.0 g/dL 7.3(L) 8.1(L) 8.2(L)  Hematocrit 39.0 - 52.0 % - - -  Platelets 150 - 400 K/uL - - -  Assessment: Active Problems:   GI bleed   Secondary esophageal varices with bleeding (HCC)   Acute gastrointestinal hemorrhage   Acute blood loss anemia  [name] Day 78 y.o. male underwent an upper endoscopy on 10/24/2018 for upper GI bleed and had esophageal varices that were banded.  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   Plan: 1. Heart rate noted to be 60 bpm.  If the heart rate goes higher and is sustained then would require to be commenced on nadolol 20 mg with a target heart rate of between 60 to 65 bpm as long as he tolerates it. 2. Low-salt diet  3.  Repeat EGD in 2 to 3 weeks time for banding of esophageal varices. 4.  CT scan of the abdomen 07/21/2018  shows 7.4 cm right renal mass with central necrosis consistent renal cell carcinoma.  This would require follow-up with oncology/surgery if already not done so. 5.  Continue and complete 5 days of ceftriaxone or ciprofloxacin for SBP prophylaxis in the setting of a GI bleed. 6.  No NSAIDs. 7. Recheck labs to ensure HB stable - if stable could go home tomorrow and recheck labs with pcp in a few days      LOS: 3 days   [name] Bellows, MD 10/26/2018, 10:40 AM

## 2018-10-26 NOTE — Progress Notes (Addendum)
[facility] [place] NAME: [name] Day    MR#:  [id number]  DATE OF BIRTH:  10-May-1941  SUBJECTIVE:  CHIEF COMPLAINT: Patient is extubated, more alert today  son at bedside.  Met palliative care yesterday  REVIEW OF SYSTEMS:  CONSTITUTIONAL: No fever, fatigue, endorses weakness.  EYES: No blurred or double vision.  EARS, NOSE, AND THROAT: No tinnitus or ear pain.  RESPIRATORY: No cough, shortness of breath, wheezing or hemoptysis.  CARDIOVASCULAR: No chest pain, orthopnea, edema.  GASTROINTESTINAL: No nausea, vomiting, diarrhea or abdominal pain.  HEMATOLOGY: No anemia, easy bruising or bleeding SKIN: No rash or lesion. MUSCULOSKELETAL: No joint pain or arthritis.   NEUROLOGIC: No tingling, numbness, weakness.  PSYCHIATRY: No anxiety or depression.   DRUG ALLERGIES:   Allergies  Allergen Reactions  . Lisinopril Other (See Comments)    REACTION: Cough  . Penicillins Other (See Comments)    Reaction: unknown childhood reaction Has patient had a PCN reaction causing immediate rash, facial/tongue/throat swelling, SOB or lightheadedness with hypotension: Unknown Has patient had a PCN reaction causing severe rash involving mucus membranes or skin necrosis: Unknown Has patient had a PCN reaction that required hospitalization: Unknown Has patient had a PCN reaction occurring within the last 10 years: Unknown If all of the above answers are "NO", then may proceed with Cephalosporin use.   . Clindamycin/Lincomycin Rash and Other (See Comments)    "turns me purple" "turns me orange"    VITALS:  Blood pressure (!) 119/56, pulse 60, temperature 97.8 F (36.6 C), temperature source Axillary, resp. rate 18, height 5' 10"  (1.778 m), weight 101.6 kg, SpO2 99 %.  PHYSICAL EXAMINATION:  GENERAL:  78 y.o.-year-old patient lying in the bed with no acute distress.  EYES: Pupils equal, round, reactive to light and accommodation. No  scleral icterus. Extraocular muscles intact.  HEENT: Head atraumatic, normocephalic. Oropharynx and nasopharynx clear.  NECK:  Supple, no jugular venous distention. No thyroid enlargement, no tenderness.  LUNGS: Normal breath sounds bilaterally, no wheezing, rales,rhonchi or crepitation. No use of accessory muscles of respiration.  CARDIOVASCULAR: S1, S2 normal. No murmurs, rubs, or gallops.  ABDOMEN: Soft, nontender, nondistended. Bowel sounds present.  EXTREMITIES: No pedal edema, cyanosis, or clubbing.  NEUROLOGIC: Awake, alert and oriented x1-2 sensation intact. Gait not checked.  PSYCHIATRIC: The patient is alert .  SKIN: No obvious rash, lesion, or ulcer.    LABORATORY PANEL:   CBC Recent Labs  Lab 10/25/18 0420  10/26/18 0602  WBC 6.2  --   --   HGB 7.1*   < > 7.3*  HCT 21.4*  --   --   PLT 114*  --   --    < > = values in this interval not displayed.   ------------------------------------------------------------------------------------------------------------------  Chemistries  Recent Labs  Lab 10/25/18 0420  NA 140  K 4.1  CL 113*  CO2 21*  GLUCOSE 178*  BUN 75*  CREATININE 1.18  CALCIUM 7.9*  AST 30  ALT 16  ALKPHOS 57  BILITOT 3.7*   ------------------------------------------------------------------------------------------------------------------  Cardiac Enzymes Recent Labs  Lab 10/23/18 1144  TROPONINI <0.03   ------------------------------------------------------------------------------------------------------------------  RADIOLOGY:  No results found.  EKG:   Orders placed or performed during the hospital encounter of 10/23/18  . ED EKG  . ED EKG    ASSESSMENT AND PLAN:     [name] Day  is a 78 y.o. male with a known history of nonalcoholic cirrhosis of liver, bipolar  disorder, diabetes, dementia, hypertension, issues with memory, renal mass (not pursuing workup), history of a fib on oral anticoagulation, history of AV block  status post pacemaker and sleep apnea comes to the emergency room due to frequent falls at home last night.   1. G.I. bleed/hematemesis in the setting of nonalcoholic cirrhosis of liver suspect variceal bleed versus rule out peptic ulcer disease Clinically doing better -IV fluids --IV Protonix drip - IV Octreotide drip-continued for 72 hours -Rocephin IV for 5 days for SBP prophylaxis as recommended by gastroenterology -GI did endoscopy which has revealed grade 3 esophageal varices incomplete eradication, banding done -transfuse one unit of blood transfusion, hemoglobin Q8 hours transfuse as needed -spoke with Dr. Allen Norris to see patient -His hemoglobin 2 1/2 weeks ago was 11.4--hemoglobin today 7.7--1 unit PRBC--hgb-6.8--7.1--8.2-7.3   2. Chronic atrial fibrillation on eliquis  -hold oral anticoagulation If heart rate is greater than 65 we will add nadolol  3. Nonalcoholic cirrhosis of liver -this was diagnosed about 3 to 6 months ago -follows with Dr. Ricky Stabs outpatient -patient has chronically elevated bilirubin and chronically elevated ammonia -xifaxan -Rocephin for SBP prophylaxis and patient is on cephalexin  4. Frequent falls -physical therapy to see patient after G.I. bleed results  5. Type II diabetes -sliding scale insulin  6. Bipolar disorder - Resume home medications Lamictal, Seroquel  7. Renal cell mass- prob cancer onc consult if family is agreeable  8. Failure to thrive with poor prognosis Palliative care consult placed as requested by son and daughter-in-law at bedside.  Crystal NP met the family and will meet them again today at 130   All the records are reviewed and case discussed with Care Management/Social Workerr. Management plans discussed with the patient, son [name] and daughter-in-law Lattie Haw and they are in agreement.  CODE STATUS: dnr   TOTAL TIME TAKING CARE OF THIS PATIENT: 36  minutes.   POSSIBLE D/C IN 2-3 DAYS, DEPENDING ON CLINICAL  CONDITION.  Note: This dictation was prepared with Dragon dictation along with smaller phrase technology. Any transcriptional errors that result from this process are unintentional.   Nicholes Mango M.D on 10/26/2018 at 1:18 PM  Between 7am to 6pm - Pager - [phone] After 6pm go to [email or web address] - password [email or web address] [facility]  Office  [phone]  CC: Primary care physician; Idelle Crouch, MD

## 2018-10-26 NOTE — Care Management (Signed)
Moved out of icu late pm 2/24.  Patient will have outpatient palliative at discharge.

## 2018-10-26 NOTE — Progress Notes (Signed)
New referral for outpatient Palliative to follow at home received from [facility]. Patient resume home health with [facility] at discharge. Patient information faxed to referral. No discharge date at this time. Flo Shanks BSN, RN, [facility] Clinical Liaison Manufacturing engineer (formerly [facility]) [phone]

## 2018-10-26 NOTE — Progress Notes (Addendum)
Daily Progress Note   Patient Name: [name] Day       Date: 10/26/2018 DOB: May 07, 1941  Age: 78 y.o. MRN#: [id number] Attending Physician: Nicholes Mango, MD Primary Care Physician: Idelle Crouch, MD Admit Date: 10/23/2018  Reason for Consultation/Follow-up: Establishing goals of care  Subjective:  Patient is resting in bed. He states he is doing fine. He is able to answer questions and tells me he lives with [name] who is present at bedside and his daughter in [place]. He states [name] helps him make medial decisions. He drifts off to asleep during the conversation. His son states he and his brother are not talking right now and again states he and his brother are equal POA's.  He states he is considering having the possible cancer worked up, but would not want surgery, chemo, or radiation if it is cancer. He states they will follow up with GI and are willing to have 1 more banding episode. He states if what GI says is not good, he will consider comfort care.   We discussed his diagnosis, prognosis, and GOC.   The difference between an aggressive medical intervention path and a comfort care path was discussed.  Values and goals of care important to patient and family were attempted to be elicited.  Discussed limitations of medical interventions to prolong quality of life for this patient at this time in this situation and discussed the concept of human mortality.    He states he will not do anything that hastens his death or shortens his life, but will not make him suffer. He would return him to the hospital for life prolonging measures if needed. He states they are taking one day at a time. He states if his father wants to do things like eating unhealthy foods, he and his wife find  healthier alternatives, and thus far he has been agreeable to this.   Recommend palliative to follow at D/C.   SUMMARY OF RECOMMENDATIONS   Recommend palliative at D/C to follow.    Prognosis:   Poor overall.   Discharge Planning: Home with palliative   Length of Stay: 3  Current Medications: Scheduled Meds:  . gabapentin  100 mg Oral BID  . insulin aspart  0-15 Units Subcutaneous Q4H  . lactulose  10 g Oral BID  .  lamoTRIgine  62.5 mg Oral QHS  . lurasidone  60 mg Oral QHS  . mouth rinse  15 mL Mouth Rinse BID  . pantoprazole (PROTONIX) IV  40 mg Intravenous Q12H  . QUEtiapine  25 mg Oral QHS  . rifaximin  550 mg Oral QPM  . tamsulosin  0.4 mg Oral QPC breakfast    Continuous Infusions: . cefTRIAXone (ROCEPHIN)  IV 2 g (10/26/18 1046)    PRN Meds: acetaminophen, ALPRAZolam, Gerhardt's butt cream, morphine injection, [DISCONTINUED] ondansetron **OR** ondansetron (ZOFRAN) IV, polyethylene glycol  Physical Exam Constitutional:      Appearance: Normal appearance.  Pulmonary:     Effort: Pulmonary effort is normal.             Vital Signs: BP (!) 119/56 (BP Location: Right Arm)   Pulse 60   Temp 97.8 F (36.6 C) (Axillary)   Resp 18   Ht 5\' 10"  (1.778 m)   Wt 101.6 kg   SpO2 99%   BMI 32.14 kg/m  SpO2: SpO2: 99 % O2 Device: O2 Device: Room Air O2 Flow Rate: O2 Flow Rate (L/min): 2 L/min  Intake/output summary:   Intake/Output Summary (Last 24 hours) at 10/26/2018 1454 Last data filed at 10/26/2018 7616 Gross per 24 hour  Intake 320 ml  Output 575 ml  Net -255 ml   LBM: Last BM Date: 10/25/18 Baseline Weight: Weight: 101.6 kg Most recent weight: Weight: 101.6 kg       Palliative Assessment/Data: 40%    Flowsheet Rows     Most Recent Value  Intake Tab  Referral Department  Hospitalist  Unit at Time of Referral  ICU  Palliative Care Primary Diagnosis  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 GI bleed 10/23/2018  . Secondary esophageal varices with bleeding ([place])   . Acute gastrointestinal hemorrhage   . Acute blood loss anemia   . Hyperammonemia ([place]) 06/28/2018  . Hepatic failure, unspecified without coma ([place]) 06/02/2018  . Anemia 05/26/2018  . Dyslipidemia associated with type 2 diabetes mellitus ([place]) 05/22/2018  . GERD without esophagitis 05/22/2018  . Somnolence 05/22/2018  . Bipolar 1 disorder ([place]) 05/12/2018  . Acute right hip pain 04/28/2018  . Frequent falls 04/24/2018  . Expressive aphasia 04/24/2018  . Left-sided Bell's palsy 03/03/2018  . Diabetic peripheral neuropathy ([place]) 03/03/2018  . Abnormal LFTs 09/08/2017  . 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 ED (erectile dysfunction) 09/08/2017  . Environmental allergies 09/08/2017  . Nephrolithiasis 09/08/2017  . Non-cardiac chest pain 09/08/2017  . Rectal bleeding 09/08/2017  . Sinusitis 09/08/2017  . Varicose veins of leg with swelling, bilateral 06/12/2017  . Venous ulcer ([place]) 05/18/2017  . Chronic venous insufficiency 05/18/2017  . Memory disorder 02/12/2017  . Gait abnormality 02/12/2017  . CKD stage 3 due to type 2 diabetes mellitus ([place]) 02/06/2017  . Hyperlipidemia 02/06/2017  . Swelling of limb 02/06/2017  . Lymphedema 02/06/2017  . Heme positive stool 12/04/2016  . Mixed dementia ([place]) 07/29/2016  . Cardiac pacemaker in  situ 04/27/2016  . AV heart block 04/24/2016  . Hyperglycemia 04/24/2016  . Generalized weakness 04/24/2016  . Syncope and collapse 04/24/2016  . Metal bone fixation hardware in place 04/21/2016    . Bilateral carotid artery stenosis 02/05/2015  . OSA (obstructive sleep apnea) 02/05/2015  . Hyperlipidemia due to type 2 diabetes mellitus ([place]) 02/05/2015  . Benign essential hypertension 01/19/2015  . Hypertension associated with diabetes ([place]) 01/19/2015  . B12 deficiency 05/23/2014  . Morbid obesity with alveolar hypoventilation ([place]) 05/23/2014  . Long-term insulin use ([place]) 05/22/2014  . Type II diabetes mellitus with manifestations, uncontrolled ([place]) 05/22/2014  . Aortic stenosis, moderate 11/14/2013  . ANEMIA-NOS 03/29/2007  . Anxiety 03/29/2007  . DEPRESSION 03/29/2007  . ALLERGIC RHINITIS 03/29/2007  . ASTHMA 03/29/2007  . Osteoarthritis 03/29/2007  . NEPHROLITHIASIS, HX OF 03/29/2007    Code Status:    Code Status Orders  (From admission, onward)         Start     Ordered   10/23/18 1405  Do not attempt resuscitation (DNR)  Continuous    Question Answer Comment  In the event of cardiac or respiratory ARREST Do not call a "code blue"   In the event of cardiac or respiratory ARREST Do not perform Intubation, CPR, defibrillation or ACLS   In the event of cardiac or respiratory ARREST Use medication by any route, position, wound care, and other measures to relive pain and suffering. May use oxygen, suction and manual treatment of airway obstruction as needed for comfort.      10/23/18 1404        Code Status History    Date Active Date Inactive Code Status Order ID Comments User Context   05/26/2018 1706 06/01/2018 2113 DNR [id number]  Saundra Shelling, MD ED   04/25/2018 1126 04/27/2018 2050 DNR [id number]  Bettey Costa, MD Inpatient   04/25/2018 0051 04/25/2018 1126 Full Code [id number]  Lance Coon, MD Inpatient        Thank you for allowing the Palliative Medicine Team to assist in the care of this patient.   Time In: 1:30 Time Out: 2:50 Total Time 1 hour 20 min Prolonged Time Billed yes      Greater than 50%  of this time was spent counseling and  coordinating care related to the above assessment and plan.  Asencion Gowda, NP  Please contact Palliative Medicine Team phone at [phone] for questions and concerns.

## 2018-10-26 NOTE — Progress Notes (Signed)
SLP Progress Note  Patient Details Name: [name] Day MRN: [id number] DOB: Dec 06, 1940   Progress treatment note: Chart reviewed; NSG and son consulted. Pt was asleep upon ST arrival, however spoke w/ son who reported pt did well at lunch - no throat clearing, coughing, choking, etc at lunch. Son did share that pt tried to chew Whole medication in applesauce and then got min choked when swallowing pill w/ water, but was able to get it down w/ ice cream.   Recommend: Medications Whole w/ Ensure or Crushed and mixed w/ ice cream to avoid chewing of medications. Continue w/ current diet w/ general aspiration precautions and full support/assist w/ feeding.   ST to f/u tomorrow re: continued toleration of diet.   Emeline General, Graduate Student SLP 10/26/2018, 2:21 PM

## 2018-10-27 ENCOUNTER — Encounter: Admission: RE | Payer: Self-pay | Source: Home / Self Care

## 2018-10-27 ENCOUNTER — Ambulatory Visit: Admission: RE | Admit: 2018-10-27 | Payer: Medicare Other | Source: Home / Self Care | Admitting: Ophthalmology

## 2018-10-27 LAB — CBC
HCT: 23.7 % — ABNORMAL LOW (ref 39.0–52.0)
Hemoglobin: 7.6 g/dL — ABNORMAL LOW (ref 13.0–17.0)
MCH: 23.5 pg — ABNORMAL LOW (ref 26.0–34.0)
MCHC: 32.1 g/dL (ref 30.0–36.0)
MCV: 73.4 fL — AB (ref 80.0–100.0)
NRBC: 0 % (ref 0.0–0.2)
Platelets: 123 10*3/uL — ABNORMAL LOW (ref 150–400)
RBC: 3.23 MIL/uL — ABNORMAL LOW (ref 4.22–5.81)
RDW: 23.1 % — ABNORMAL HIGH (ref 11.5–15.5)
WBC: 4.6 10*3/uL (ref 4.0–10.5)

## 2018-10-27 LAB — BASIC METABOLIC PANEL
Anion gap: 7 (ref 5–15)
BUN: 35 mg/dL — ABNORMAL HIGH (ref 8–23)
CO2: 23 mmol/L (ref 22–32)
Calcium: 7.7 mg/dL — ABNORMAL LOW (ref 8.9–10.3)
Chloride: 111 mmol/L (ref 98–111)
Creatinine, Ser: 0.92 mg/dL (ref 0.61–1.24)
GFR calc Af Amer: >60 mL/min (ref >60)
GFR calc non Af Amer: >60 mL/min (ref >60)
Glucose, Bld: 158 mg/dL — ABNORMAL HIGH (ref 70–99)
Potassium: 3.4 mmol/L — ABNORMAL LOW (ref 3.5–5.1)
Sodium: 141 mmol/L (ref 135–145)

## 2018-10-27 LAB — GLUCOSE, CAPILLARY
Glucose-Capillary: 143 mg/dL — ABNORMAL HIGH (ref 70–99)
Glucose-Capillary: 153 mg/dL — ABNORMAL HIGH (ref 70–99)
Glucose-Capillary: 167 mg/dL — ABNORMAL HIGH (ref 70–99)
Glucose-Capillary: 196 mg/dL — ABNORMAL HIGH (ref 70–99)
Glucose-Capillary: 204 mg/dL — ABNORMAL HIGH (ref 70–99)

## 2018-10-27 LAB — HEMOGLOBIN
Hemoglobin: 8.2 g/dL — ABNORMAL LOW (ref 13.0–17.0)
Hemoglobin: 7.8 g/dL — ABNORMAL LOW (ref 13.0–17.0)

## 2018-10-27 SURGERY — PHACOEMULSIFICATION, CATARACT, WITH IOL INSERTION
Anesthesia: Choice | Laterality: Right

## 2018-10-27 MED ORDER — POTASSIUM CHLORIDE CRYS ER 20 MEQ PO TBCR
20.0000 meq | EXTENDED_RELEASE_TABLET | Freq: Once | ORAL | Status: AC
  Administered 2018-10-27: 20 meq via ORAL
  Filled 2018-10-27: qty 1

## 2018-10-27 NOTE — Progress Notes (Signed)
Physical Therapy Treatment Patient Details Name: [name] Day MRN: [id number] DOB: [birth year] Today's Date: 10/27/2018    History of Present Illness Pt is 78 y.o male who presented to the ED with 3 falls and increased confusion. Head/spine CT showed no acute abnormalities. Pt was found to had 7.1 Hbg and was transfused. Pt was intubated and EGD was performed. EGD showed grade 3 varices along with excess blood in stomach and esophagus. Varices were banded with plans to perform another EGD in 2-3 weeks to complete. Pt with PMHx of TIA, CVA, permanent pacemaker, bells palsy, HTN, DM, neuropathy, dementia, carotid artery occlusion, lung CA, biopolar, A-fib, and renal mass.     PT Comments    Pt limited this session due to global weakness and request to use BSC. Pt required a heavy mod A for trunk support from sup>sit. Mod A x2 required for STS and min A x2 for stand pivot to [facility]. Pt daughter in law present during treatment and reports that pt typically needs 30+ min for BM. Daughter in law to stay with pt while on [facility] and she agreed to call NT/RN when pt was done on [facility]. Pt left on BSC with BUE support of hand rail and feet flat on floor with no unsteadiness noted. RN and NT aware of pt location. Current discharge plan updated to recommend SNF due to increase in assist provided, pt decreased activity tolerance, muscle weakness, and balance impairments.    Follow Up Recommendations  SNF     Equipment Recommendations  Other (comment)(TBD at next venue )    Recommendations for Other Services       Precautions / Restrictions Precautions Precautions: Fall Restrictions Weight Bearing Restrictions: No    Mobility  Bed Mobility Overal bed mobility: Needs Assistance Bed Mobility: Supine to Sit     Supine to sit: Mod assist;HOB elevated     General bed mobility comments: VC for sequencing for BLE to EOB. Once at EOB Heavy mod A provided for trunk support into sitting. Pt able to  maintain sitting balance when hands placed on bed in proper position by SPT,  Transfers Overall transfer level: Needs assistance Equipment used: Rolling walker (2 wheeled) Transfers: Sit to/from [facility] Sit to Stand: Mod assist;+2 physical assistance Stand pivot transfers: Min assist;+2 physical assistance       General transfer comment: Mod A x2 for STS. Tactile cues for pt to initiate upright posture. Verbal, visual and tactile cues for foot/hand placement and RW management. Mod A x2 with poorly controlled decent to [facility].   Ambulation/Gait             General Gait Details: unable to attempt due to pt on [facility] and daughter in law reports BM takes 30+ minutres.   Stairs             Wheelchair Mobility    Modified Rankin (Stroke Patients Only)       Balance Overall balance assessment: Needs assistance Sitting-balance support: Bilateral upper extremity supported;Feet supported Sitting balance-Leahy Scale: Poor Sitting balance - Comments: BUE support required to maintain static balance but demonstrates limited willingness to move outside of his BOS.   Standing balance support: Bilateral upper extremity supported;During functional activity Standing balance-Leahy Scale: Poor Standing balance comment: BUE support of RW required during static and dynamic activity.                             Cognition  Arousal/Alertness: Awake/alert Behavior During Therapy: Flat affect;Agitated Overall Cognitive Status: History of cognitive impairments - at baseline                                 General Comments: Pt appearing to acknowledge simple commands/questions however not responding as accurately as previous session. Daughter in law reports he recently restarted taking his dementia medication which could be affecting his cognition.       Exercises General Exercises - Lower Extremity Ankle Circles/Pumps: AROM;Both;10  reps;Supine Short Arc Quad: Strengthening;Both;10 reps;Supine Heel Slides: AAROM;Strengthening;Both;Other reps (comment);Supine(8) Hip ABduction/ADduction: Strengthening;Both;10 reps;Supine    General Comments General comments (skin integrity, edema, etc.): Per pt daughter in law pt requires 30+ minutes to have BM and pt reported he needed to go. NT assisted with stand pivot transfer and was notified that PT could not wait in room for 30 min. Pt left with daughter on [facility] with no signs of unsteadiness noted with BUE of rails and feet flat on floor. Daughter instructed to give RN/NT call when pt is finished on [facility] to assist with further mobility. RN notified.       Pertinent Vitals/Pain Pain Assessment: No/denies pain(no signs of pain)    Home Living                      Prior Function            PT Goals (current goals can now be found in the care plan section) Acute Rehab PT Goals Patient Stated Goal: per daughter in law to go home  PT Goal Formulation: With family Time For Goal Achievement: 11/08/18 Potential to Achieve Goals: Fair Progress towards PT goals: Not progressing toward goals - comment(global weakness and cognition)    Frequency    Min 2X/week      PT Plan Discharge plan needs to be updated    Co-evaluation              AM-PAC PT "6 Clicks" Mobility   Outcome Measure  Help needed turning from your back to your side while in a flat bed without using bedrails?: A Little Help needed moving from lying on your back to sitting on the side of a flat bed without using bedrails?: A Lot Help needed moving to and from a bed to a chair (including a wheelchair)?: A Lot Help needed standing up from a chair using your arms (e.g., wheelchair or bedside chair)?: A Lot Help needed to walk in hospital room?: A Lot Help needed climbing 3-5 steps with a railing? : A Lot 6 Click Score: 13    End of Session Equipment Utilized During Treatment: Gait belt Activity  Tolerance: Other (comment)(pt limited by global weakness and requested to use [facility]) Patient left: with family/visitor present;with call bell/phone within reach;Other (comment)(on BSC ) Nurse Communication: Mobility status;Other (comment)(RN/NT aware pt on [facility] with daughter in law present ) PT Visit Diagnosis: Unsteadiness on feet (R26.81);Other abnormalities of gait and mobility (R26.89);Muscle weakness (generalized) (M62.81);Repeated falls (R29.6);History of falling (Z91.81)     Time: 6812-7517 PT Time Calculation (min) (ACUTE ONLY): 36 min  Charges:  $Therapeutic Exercise: 8-22 mins $Therapeutic Activity: 8-22 mins                     Dorothy Spark, SPT 10/27/2018, 1:06 PM

## 2018-10-27 NOTE — Plan of Care (Signed)
Pt hasn't voided since this am.  Family requested that we bladder scan him - NT only got 175 but requested we do an in/out cath b/c they had to bladder scan him yesterday and did in/out cath and got 875 out.  We got 600 ml out from this in/out cath.

## 2018-10-27 NOTE — Progress Notes (Addendum)
SLP Progress Note  Patient Details Name: [name] Day MRN: [id number] DOB: Jan 04, 1941   Cancelled treatment:       Reason Eval/Treat Not Completed: SLP screened, no needs identified, will sign off. Chart reviewed; NSG consulted. Pt was asleep upon ST entry. Dtr in law was present at bedside and reported pt is tolerating current diet well and ate all of his breakfast tray w/o any immediate, overt difficulty w/ swallowing function (throat clearing, coughing, choking, etc). Dtr in law shared that pt did get min choked on medication in ice cream again last night, however she noted he had just woken up and was groggy. She shared following the pill w/ Ensure seemed to help. ST recommended continuing to take medications w/ Ensure or asking NSG to crush medications in ice cream to avoid pt trying to chew medication.  Recommend: continue this method of swallowing pills w/ Ensure/ice cream from this time forward after discharge. NSG updated and agreed.  ST services to sign-off at this time; re-consult if any further needs arise while admitted. NSG and family member (Dtr in Sports coach) consulted and agreed.   Emeline General, Graduate Student SLP 10/27/2018, 9:10 AM

## 2018-10-27 NOTE — Care Management Important Message (Signed)
Important Message  Patient Details  Name: [name] Day MRN: [id number] Date of Birth: 1941/06/29   Medicare Important Message Given:  Yes    [name] 10/27/2018, 11:23 AM

## 2018-10-27 NOTE — Progress Notes (Signed)
[name] Day , MD [address] [place], [place], [place], [id number], [place], [place], [place], [address] Phone: [phone]  Fax: [phone]   [name] Day is being followed for Variceal bleed   Subjective: No bowel movements, less confusion, out of bed and mobile    Objective: Vital signs in last 24 hours: Vitals:   10/26/18 0405 10/26/18 1608 10/26/18 1945 10/27/18 0456  BP: (!) 119/56 (!) 111/51 (!) 106/46 (!) 127/54  Pulse: 60 (!) 59 60 60  Resp: 18 16 18 18   Temp: 97.8 F (36.6 C) 97.6 F (36.4 C) 97.8 F (36.6 C) 98.1 F (36.7 C)  TempSrc: Axillary Oral Oral Oral  SpO2: 99% 99% 96% 96%  Weight:      Height:       Weight change:   Intake/Output Summary (Last 24 hours) at 10/27/2018 1120 Last data filed at 10/27/2018 1011 Gross per 24 hour  Intake 360 ml  Output 875 ml  Net -515 ml     Exam: Heart:: Regular rate and rhythm, S1S2 present or without murmur or extra heart sounds Lungs: normal, clear to auscultation and clear to auscultation and percussion Abdomen: soft, nontender, normal bowel sounds   Lab Results: @LABTEST2 @ Micro Results: Recent Results (from the past 240 hour(s))  MRSA PCR Screening     Status: None   Collection Time: 10/23/18  2:21 PM  Result Value Ref Range Status   MRSA by PCR NEGATIVE NEGATIVE Final    Comment:        The GeneXpert MRSA Assay (FDA approved for NASAL specimens only), is one component of a comprehensive MRSA colonization surveillance program. It is not intended to diagnose MRSA infection nor to guide or monitor treatment for MRSA infections. Performed at [facility], [address] [place], [place] [address]   Culture, Urine     Status: Abnormal   Collection Time: 10/25/18 12:29 AM  Result Value Ref Range Status   Specimen Description   Final    URINE, RANDOM Performed at [facility], [address], [place], [place] [address]    Special Requests   Final      NONE Performed at [facility], [address] [place], [place] [address]    Culture (A)  Final    <10,000 COLONIES/mL INSIGNIFICANT GROWTH Performed at [facility] [address], [place], [place] [address]    Report Status 10/26/2018 FINAL  Final   Studies/Results: No results found. Medications: I have reviewed the patient's current medications. Scheduled Meds: . gabapentin  100 mg Oral BID  . insulin aspart  0-15 Units Subcutaneous Q4H  . lactulose  10 g Oral BID  . lamoTRIgine  62.5 mg Oral QHS  . lurasidone  60 mg Oral QHS  . mouth rinse  15 mL Mouth Rinse BID  . pantoprazole (PROTONIX) IV  40 mg Intravenous Q12H  . QUEtiapine  25 mg Oral QHS  . rifaximin  550 mg Oral QPM  . tamsulosin  0.4 mg Oral QPC breakfast   Continuous Infusions: . cefTRIAXone (ROCEPHIN)  IV 2 g (10/27/18 1101)   PRN Meds:.acetaminophen, ALPRAZolam, Gerhardt's butt cream, morphine injection, [DISCONTINUED] ondansetron **OR** ondansetron (ZOFRAN) IV, polyethylene glycol   Assessment: Active Problems:   GI bleed   Secondary esophageal varices with bleeding (HCC)   Acute gastrointestinal hemorrhage   Acute blood loss anemia  CBC Latest Ref Rng & Units 10/27/2018 10/26/2018 10/26/2018  WBC 4.0 - 10.5 K/uL  4.6 - -  Hemoglobin 13.0 - 17.0 g/dL 7.6(L) 8.2(L) 7.7(L)  Hematocrit 39.0 - 52.0 % 23.7(L) - -  Platelets 150 - 400 K/uL 123(L) - -   liam A Franklin77 y.o.maleunderwent an upper endoscopy on 10/24/2018 for upper GI bleed and had esophageal varices that were banded. 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 Heart rate noted to be 60 bpm. If the heart rate goes higher and is sustained then would require to be commenced on nadolol 20 mg with a target heart rate of between 60 to 65 bpm as long as he tolerates it. 2. Low-salt diet  3. Repeat EGD in 2 to 3 weeks time for banding of esophageal varices. 4. CT scan  of the abdomen 07/21/2018 shows 7.4 cm right renal mass with central necrosis consistent renal cell carcinoma. This would require follow-up with oncology/surgeryif already not done so. 5.Continue and complete 5 days of ceftriaxoneor ciprofloxacinfor SBP prophylaxis in the setting of a GI bleed. 6. No NSAIDs. 7. Dsicharge on PO  PPI  I will sign off.  Please call me if any further GI concerns or questions.  We would like to thank you for the opportunity to participate in the care of [name] Day.      LOS: 4 days   [name] Bellows, MD 10/27/2018, 11:20 AM

## 2018-10-27 NOTE — Progress Notes (Signed)
[place] [facility] [place] NAME: [name] Day    MR#:  [id number]  DATE OF BIRTH:  1941-06-15  SUBJECTIVE:  CHIEF COMPLAINT: Patient is more active and alert today according to the daughter-in-law at bedside No bowel movement for the past 12 hours  REVIEW OF SYSTEMS:  CONSTITUTIONAL: No fever, fatigue, endorses weakness.  EYES: No blurred or double vision.  EARS, NOSE, AND THROAT: No tinnitus or ear pain.  RESPIRATORY: No cough, shortness of breath, wheezing or hemoptysis.  CARDIOVASCULAR: No chest pain, orthopnea, edema.  GASTROINTESTINAL: No nausea, vomiting, diarrhea or abdominal pain.  HEMATOLOGY: No anemia, easy bruising or bleeding SKIN: No rash or lesion. MUSCULOSKELETAL: No joint pain or arthritis.   NEUROLOGIC: No tingling, numbness, weakness.  PSYCHIATRY: No anxiety or depression.   DRUG ALLERGIES:   Allergies  Allergen Reactions  . Lisinopril Other (See Comments)    REACTION: Cough  . Penicillins Other (See Comments)    Reaction: unknown childhood reaction Has patient had a PCN reaction causing immediate rash, facial/tongue/throat swelling, SOB or lightheadedness with hypotension: Unknown Has patient had a PCN reaction causing severe rash involving mucus membranes or skin necrosis: Unknown Has patient had a PCN reaction that required hospitalization: Unknown Has patient had a PCN reaction occurring within the last 10 years: Unknown If all of the above answers are "NO", then may proceed with Cephalosporin use.   . Clindamycin/Lincomycin Rash and Other (See Comments)    "turns me purple" "turns me orange"    VITALS:  Blood pressure (!) 127/54, pulse 60, temperature 98.1 F (36.7 C), temperature source Oral, resp. rate 18, height 5' 10" (1.778 m), weight 101.6 kg, SpO2 96 %.  PHYSICAL EXAMINATION:  GENERAL:  78 y.o.-year-old patient lying in the bed with no acute distress.  EYES: Pupils equal, round, reactive to  light and accommodation. No scleral icterus. Extraocular muscles intact.  HEENT: Head atraumatic, normocephalic. Oropharynx and nasopharynx clear.  NECK:  Supple, no jugular venous distention. No thyroid enlargement, no tenderness.  LUNGS: Normal breath sounds bilaterally, no wheezing, rales,rhonchi or crepitation. No use of accessory muscles of respiration.  CARDIOVASCULAR: S1, S2 normal. No murmurs, rubs, or gallops.  ABDOMEN: Soft, nontender, nondistended. Bowel sounds present.  EXTREMITIES: No pedal edema, cyanosis, or clubbing.  NEUROLOGIC: Awake, alert and oriented x1-2 sensation intact. Gait not checked.  PSYCHIATRIC: The patient is alert .  SKIN: No obvious rash, lesion, or ulcer.    LABORATORY PANEL:   CBC Recent Labs  Lab 10/27/18 0616 10/27/18 1402  WBC 4.6  --   HGB 7.6* 8.2*  HCT 23.7*  --   PLT 123*  --    ------------------------------------------------------------------------------------------------------------------  Chemistries  Recent Labs  Lab 10/25/18 0420 10/27/18 0616  NA 140 141  K 4.1 3.4*  CL 113* 111  CO2 21* 23  GLUCOSE 178* 158*  BUN 75* 35*  CREATININE 1.18 0.92  CALCIUM 7.9* 7.7*  AST 30  --   ALT 16  --   ALKPHOS 57  --   BILITOT 3.7*  --    ------------------------------------------------------------------------------------------------------------------  Cardiac Enzymes Recent Labs  Lab 10/23/18 1144  TROPONINI <0.03   ------------------------------------------------------------------------------------------------------------------  RADIOLOGY:  No results found.  EKG:   Orders placed or performed during the hospital encounter of 10/23/18  . ED EKG  . ED EKG    ASSESSMENT AND PLAN:     [name] Day  is a 78 y.o. male with a known history of nonalcoholic cirrhosis  of liver, bipolar disorder, diabetes, dementia, hypertension, issues with memory, renal mass (not pursuing workup), history of a fib on oral  anticoagulation, history of AV block status post pacemaker and sleep apnea comes to the emergency room due to frequent falls at home last night.   1. G.I. bleed/hematemesis in the setting of nonalcoholic cirrhosis of liver suspect variceal bleed versus rule out peptic ulcer disease Clinically doing better -IV fluids --IV Protonix drip - IV Octreotide drip-continued for 72 hours -Rocephin IV for 5 days for SBP prophylaxis as recommended by gastroenterology-started on 10/25/2018-continue until 10/29/2018 Friday -GI did endoscopy which has revealed grade 3 esophageal varices incomplete eradication, banding done.  Repeat EGD in 3 weeks approximately -transfuse one unit of blood transfusion, hemoglobin Q8 hours transfuse as needed -spoke with Dr. Allen Norris to see patient -His hemoglobin 2 1/2 weeks ago was 11.4--hemoglobin today 7.7--1 unit PRBC--hgb-6.8--7.1--8.2-7.3-7.6-8.2   2. Chronic atrial fibrillation on eliquis  -hold oral anticoagulation If heart rate is greater than 65 we will add nadolol  3. Nonalcoholic cirrhosis of liver -this was diagnosed about 3 to 6 months ago -follows with Dr. Ricky Stabs outpatient -patient has chronically elevated bilirubin and chronically elevated ammonia -xifaxan -Rocephin for SBP prophylaxis and patient is on cephalexin  4. Frequent falls -physical therapy consult  5. Type II diabetes -sliding scale insulin  6. Bipolar disorder - Resume home medications Lamictal, Seroquel  7. Renal cell mass- prob cancer Patient has an appointment with [facility] in March for possible biopsy   8. Failure to thrive with poor prognosis Palliative care consult placed as requested by son and daughter-in-law at bedside.  Crystal NP met the family -family is agreeable for outpatient palliative care  9.  Generalized weakness PT consult  All the records are reviewed and case discussed with Care Management/Social Workerr. Management plans discussed with the patient,  daughter-in-law [name] and they are in agreement.  CODE STATUS: dnr   TOTAL TIME TAKING CARE OF THIS PATIENT: 36  minutes.   POSSIBLE D/C IN 2- DAYS, DEPENDING ON CLINICAL CONDITION.  Note: This dictation was prepared with Dragon dictation along with smaller phrase technology. Any transcriptional errors that result from this process are unintentional.   Nicholes Mango M.D on 10/27/2018 at 4:34 PM  Between 7am to 6pm - Pager - [phone] After 6pm go to [email or web address] - password [email or web address] [facility]  Office  [phone]  CC: Primary care physician; Idelle Crouch, MD

## 2018-10-28 LAB — GLUCOSE, CAPILLARY
Glucose-Capillary: 191 mg/dL — ABNORMAL HIGH (ref 70–99)
Glucose-Capillary: 191 mg/dL — ABNORMAL HIGH (ref 70–99)
Glucose-Capillary: 202 mg/dL — ABNORMAL HIGH (ref 70–99)
Glucose-Capillary: 204 mg/dL — ABNORMAL HIGH (ref 70–99)
Glucose-Capillary: 207 mg/dL — ABNORMAL HIGH (ref 70–99)
Glucose-Capillary: 220 mg/dL — ABNORMAL HIGH (ref 70–99)
Glucose-Capillary: 221 mg/dL — ABNORMAL HIGH (ref 70–99)

## 2018-10-28 LAB — CBC
HCT: 23.9 % — ABNORMAL LOW (ref 39.0–52.0)
Hemoglobin: 7.6 g/dL — ABNORMAL LOW (ref 13.0–17.0)
MCH: 23.2 pg — ABNORMAL LOW (ref 26.0–34.0)
MCHC: 31.8 g/dL (ref 30.0–36.0)
MCV: 72.9 fL — ABNORMAL LOW (ref 80.0–100.0)
PLATELETS: 136 10*3/uL — AB (ref 150–400)
RBC: 3.28 MIL/uL — ABNORMAL LOW (ref 4.22–5.81)
RDW: 23.8 % — ABNORMAL HIGH (ref 11.5–15.5)
WBC: 5.3 10*3/uL (ref 4.0–10.5)
nRBC: 0.4 % — ABNORMAL HIGH (ref 0.0–0.2)

## 2018-10-28 LAB — HEMOGLOBIN: Hemoglobin: 7.8 g/dL — ABNORMAL LOW (ref 13.0–17.0)

## 2018-10-28 MED ORDER — PANTOPRAZOLE SODIUM 40 MG PO TBEC
40.0000 mg | DELAYED_RELEASE_TABLET | Freq: Two times a day (BID) | ORAL | Status: DC
  Administered 2018-10-28 – 2018-10-29 (×2): 40 mg via ORAL
  Filled 2018-10-28 (×2): qty 1

## 2018-10-28 MED ORDER — SODIUM CHLORIDE 0.9 % IV SOLN
INTRAVENOUS | Status: DC | PRN
  Administered 2018-10-28 – 2018-10-29 (×2): 500 mL via INTRAVENOUS

## 2018-10-28 NOTE — Progress Notes (Signed)
Bladder scan amount 200 ml. Pt denies discomfort. States that he does not feel like he needs to void at this time.

## 2018-10-28 NOTE — Progress Notes (Signed)
[name] Day at [place] NAME: [name] Day    MR#:  [id number]  DATE OF BIRTH:  [birth year]  SUBJECTIVE:  CHIEF COMPLAINT: Patient is more active and alert today.  No other episodes of bleeding Patient is refusing to go to skilled nursing facility.  Wants to go home with home health son is agreeable  REVIEW OF SYSTEMS:  CONSTITUTIONAL: No fever, fatigue, endorses weakness.  EYES: No blurred or double vision.  EARS, NOSE, AND THROAT: No tinnitus or ear pain.  RESPIRATORY: No cough, shortness of breath, wheezing or hemoptysis.  CARDIOVASCULAR: No chest pain, orthopnea, edema.  GASTROINTESTINAL: No nausea, vomiting, diarrhea or abdominal pain.  HEMATOLOGY: No anemia, easy bruising or bleeding SKIN: No rash or lesion. MUSCULOSKELETAL: No joint pain or arthritis.   NEUROLOGIC: No tingling, numbness, weakness.  PSYCHIATRY: No anxiety or depression.   DRUG ALLERGIES:   Allergies  Allergen Reactions  . Lisinopril Other (See Comments)    REACTION: Cough  . Penicillins Other (See Comments)    Reaction: unknown childhood reaction Has patient had a PCN reaction causing immediate rash, facial/tongue/throat swelling, SOB or lightheadedness with hypotension: Unknown Has patient had a PCN reaction causing severe rash involving mucus membranes or skin necrosis: Unknown Has patient had a PCN reaction that required hospitalization: Unknown Has patient had a PCN reaction occurring within the last 10 years: Unknown If all of the above answers are "NO", then may proceed with Cephalosporin use.   . Clindamycin/Lincomycin Rash and Other (See Comments)    "turns me purple" "turns me orange"    VITALS:  Blood pressure (!) 121/57, pulse 61, temperature 97.9 F (36.6 C), temperature source Oral, resp. rate 17, height 5' 10"  (1.778 m), weight 101.6 kg, SpO2 97 %.  PHYSICAL EXAMINATION:  GENERAL:  78 y.o.-year-old patient lying in the bed with no  acute distress.  EYES: Pupils equal, round, reactive to light and accommodation. No scleral icterus. Extraocular muscles intact.  HEENT: Head atraumatic, normocephalic. Oropharynx and nasopharynx clear.  NECK:  Supple, no jugular venous distention. No thyroid enlargement, no tenderness.  LUNGS: Normal breath sounds bilaterally, no wheezing, rales,rhonchi or crepitation. No use of accessory muscles of respiration.  CARDIOVASCULAR: S1, S2 normal. No murmurs, rubs, or gallops.  ABDOMEN: Soft, nontender, nondistended. Bowel sounds present.  EXTREMITIES: No pedal edema, cyanosis, or clubbing.  NEUROLOGIC: Awake, alert and oriented x1-2 sensation intact. Gait not checked.  PSYCHIATRIC: The patient is alert .  SKIN: No obvious rash, lesion, or ulcer.    LABORATORY PANEL:   CBC Recent Labs  Lab 10/28/18 0554 10/28/18 1356  WBC 5.3  --   HGB 7.6* 7.8*  HCT 23.9*  --   PLT 136*  --    ------------------------------------------------------------------------------------------------------------------  Chemistries  Recent Labs  Lab 10/25/18 0420 10/27/18 0616  NA 140 141  K 4.1 3.4*  CL 113* 111  CO2 21* 23  GLUCOSE 178* 158*  BUN 75* 35*  CREATININE 1.18 0.92  CALCIUM 7.9* 7.7*  AST 30  --   ALT 16  --   ALKPHOS 57  --   BILITOT 3.7*  --    ------------------------------------------------------------------------------------------------------------------  Cardiac Enzymes Recent Labs  Lab 10/23/18 1144  TROPONINI <0.03   ------------------------------------------------------------------------------------------------------------------  RADIOLOGY:  No results found.  EKG:   Orders placed or performed during the hospital encounter of 10/23/18  . ED EKG  . ED EKG    ASSESSMENT AND PLAN:     [name] Day  is a 78 y.o. male with a known history of nonalcoholic cirrhosis of liver, bipolar disorder, diabetes, dementia, hypertension, issues with memory, renal mass  (not pursuing workup), history of a fib on oral anticoagulation, history of AV block status post pacemaker and sleep apnea comes to the emergency room due to frequent falls at home last night.   1. G.I. bleed/hematemesis in the setting of nonalcoholic cirrhosis of liver suspect variceal bleed versus rule out peptic ulcer disease Clinically doing better -IV fluids --IV Protonix drip -changed to Protonix p.o. twice daily - IV Octreotide drip-continued for 72 hours -Rocephin IV for 5 days for SBP prophylaxis as recommended by gastroenterology-started on 10/25/2018-continue until 10/29/2018 Friday -GI did endoscopy which has revealed grade 3 esophageal varices incomplete eradication, banding done.  Repeat EGD in 3 weeks approximately -transfuse one unit of blood transfusion, hemoglobin Q8 hours transfuse as needed -spoke with Dr. Allen Norris to see patient -His hemoglobin 2 1/2 weeks ago was 11.4--hemoglobin today 7.7--1 unit PRBC--hgb-6.8--7.1--8.2-7.3-7.6-8.2-7.8-7.6-7.8   2. Chronic atrial fibrillation on eliquis  -hold oral anticoagulation for now, discussed with the gastroenterology Dr. Vicente Males not recommending to resume Eliquis at this time If heart rate is greater than 65 we will add nadolol  3. Nonalcoholic cirrhosis of liver -this was diagnosed about 3 to 6 months ago -follows with Dr. Ricky Stabs outpatient -patient has chronically elevated bilirubin and chronically elevated ammonia -xifaxan -Rocephin for SBP prophylaxis and patient is on cephalexin  4. Frequent falls -physical therapy consult-recommending skilled nursing facility patient is refusing  5. Type II diabetes -sliding scale insulin  6. Bipolar disorder - Resume home medications Lamictal, Seroquel  7. Renal cell mass- prob cancer Patient has an appointment with [facility] in March for possible biopsy   8. Failure to thrive with poor prognosis Palliative care consult placed as requested by son and daughter-in-law at bedside.   Crystal NP met the family -family is agreeable for outpatient palliative care  9.  Generalized weakness PT consult-recommending skilled nursing facility.  Patient and family are refusing they prefer going home with home health  All the records are reviewed and case discussed with Care Management/Social Workerr. Management plans discussed with the patient, daughter-in-law [name] and they are in agreement.  CODE STATUS: dnr   TOTAL TIME TAKING CARE OF THIS PATIENT: 36  minutes.   POSSIBLE D/C IN 1- DAYS, DEPENDING ON CLINICAL CONDITION.  Note: This dictation was prepared with Dragon dictation along with smaller phrase technology. Any transcriptional errors that result from this process are unintentional.   Nicholes Mango M.D on 10/28/2018 at 4:17 PM  Between 7am to 6pm - Pager - [phone] After 6pm go to [email or web address] - password [email or web address] [facility]  Office  [phone]  CC: Primary care physician; Idelle Crouch, MD

## 2018-10-28 NOTE — Progress Notes (Signed)
Bladder can 450-500ML I &Out cath 650 Ml urine/ amber and clear

## 2018-10-28 NOTE — Progress Notes (Signed)
Physical Therapy Treatment Patient Details Name: [name] Day MRN: [id number] DOB: 1940/09/20 Today's Date: 10/28/2018    History of Present Illness Pt is 78 y.o male who presented to the ED with 3 falls and increased confusion. Head/spine CT showed no acute abnormalities. Pt was found to had 7.1 Hbg and was transfused. Pt was intubated and EGD was performed. EGD showed grade 3 varices along with excess blood in stomach and esophagus. Varices were banded with plans to perform another EGD in 2-3 weeks to complete. Pt with PMHx of TIA, CVA, permanent pacemaker, bells palsy, HTN, DM, neuropathy, dementia, carotid artery occlusion, lung CA, biopolar, A-fib, and renal mass.     PT Comments    Pt agreeable to PT with encouragement. Encouragement required throughout, but overall pt able to demonstrate more than pt thinks he can do. Demonstrates supine exercises with assist as needed. Pt/family given written exercises to perform several times per day. Bed mobility with Mod A to sit and all sequence cues. Initial posterior lean strong, but ultimately able to active forward flex and maintain seated posture with intermittent cues and close supervision. STS with Mod A x 2 and elevated surface with good effort and ability to rise with encouragement of QS/GS. Pt wishes to sit immediately without adverse symptoms; pt ultimately states "because its laziness". Pt encouraged to work for his own benefit and is able to remain standing for several minutes with Min guard of 2 for with weight shifting and low clearance marching. Max A to return to bed from sit and reposition upward. Continue PT to progress endurance, strength and balance in both sitting and stand to improve all functional mobility.     Follow Up Recommendations  SNF;Other (comment)(Family wishes to take pt home with home health)     Equipment Recommendations  Other (comment)    Recommendations for Other Services       Precautions /  Restrictions Precautions Precautions: Fall Restrictions Weight Bearing Restrictions: No    Mobility  Bed Mobility Overal bed mobility: Needs Assistance Bed Mobility: Supine to Sit;Sit to Supine     Supine to sit: Mod assist;HOB elevated Sit to supine: Max assist;+2 for physical assistance   General bed mobility comments: Cues for all sequencing and encouragement. Posterior lean initially, but demonstrates good ability to forward flex trunk with cues  Transfers Overall transfer level: Needs assistance Equipment used: Rolling walker (2 wheeled) Transfers: Sit to/from Stand Sit to Stand: Mod assist;+2 physical assistance;From elevated surface         General transfer comment: Unorthodox with BUEs on rolling walker; demonstrates good effort with encouragement/cues.   Ambulation/Gait             General Gait Details: does not ambulate, but works on weight shifting and march (very low clearance) in place   Marine scientist Rankin (Stroke Patients Only)       Balance Overall balance assessment: Needs assistance Sitting-balance support: Bilateral upper extremity supported;Feet supported Sitting balance-Leahy Scale: Fair Sitting balance - Comments: Inconsistent between fair and poor; has tendency to posterior lean, but can self correct with cues/encouragement   Standing balance support: Bilateral upper extremity supported Standing balance-Leahy Scale: Poor Standing balance comment: Requires Min A x 2 for safety.  Cognition Arousal/Alertness: Awake/alert Behavior During Therapy: WFL for tasks assessed/performed Overall Cognitive Status: History of cognitive impairments - at baseline                                        Exercises General Exercises - Lower Extremity Ankle Circles/Pumps: AROM;Both;20 reps Quad Sets: Strengthening;Both;20 reps Gluteal Sets:  Strengthening;Both;20 reps Short Arc Quad: AROM;Both;20 reps Long Arc Quad: Strengthening;Both;10 reps;Seated Heel Slides: AAROM;Both;20 reps Hip ABduction/ADduction: Strengthening;Both;20 reps Straight Leg Raises: AAROM;Both;10 reps Hip Flexion/Marching: Strengthening;Both;10 reps;Standing(low clearance)    General Comments        Pertinent Vitals/Pain Pain Assessment: No/denies pain    Home Living                      Prior Function            PT Goals (current goals can now be found in the care plan section) Progress towards PT goals: Progressing toward goals    Frequency    Min 2X/week      PT Plan Current plan remains appropriate    Co-evaluation              AM-PAC PT "6 Clicks" Mobility   Outcome Measure  Help needed turning from your back to your side while in a flat bed without using bedrails?: A Little Help needed moving from lying on your back to sitting on the side of a flat bed without using bedrails?: A Lot Help needed moving to and from a bed to a chair (including a wheelchair)?: A Lot Help needed standing up from a chair using your arms (e.g., wheelchair or bedside chair)?: A Lot Help needed to walk in hospital room?: A Lot Help needed climbing 3-5 steps with a railing? : Total 6 Click Score: 12    End of Session Equipment Utilized During Treatment: Gait belt Activity Tolerance: Patient tolerated treatment well;Other (comment)(requires encouragement throughout) Patient left: in bed;with call bell/phone within reach;with family/visitor present;Other (comment)(family requests no alarm)   PT Visit Diagnosis: Unsteadiness on feet (R26.81);Other abnormalities of gait and mobility (R26.89);Muscle weakness (generalized) (M62.81);Repeated falls (R29.6);History of falling (Z91.81)     Time: 8144-8185 PT Time Calculation (min) (ACUTE ONLY): 49 min  Charges:  $Therapeutic Exercise: 23-37 mins $Therapeutic Activity: 8-22 mins                       Larae Grooms, PTA 10/28/2018, 5:11 PM

## 2018-10-28 NOTE — Progress Notes (Addendum)
Daily Progress Note   Patient Name: [name] Day       Date: 10/28/2018 DOB: 04/09/1941  Age: 78 y.o. MRN#: [id number] Attending Physician: Nicholes Mango, MD Primary Care Physician: Idelle Crouch, MD Admit Date: 10/23/2018  Reason for Consultation/Follow-up: Psycho/social support.   Subjective:  Patient is resting in bed. Son [name] is at bedside. He states his father's abx should be completed tomorrow and they are planning for D/C tomorrow. Patient is looking forward to going home.    Recommend palliative to follow at D/C.   SUMMARY OF RECOMMENDATIONS   Recommend palliative at D/C to follow.    Prognosis:   Poor overall.   Discharge Planning: Home with palliative   Length of Stay: 5  Current Medications: Scheduled Meds:  . gabapentin  100 mg Oral BID  . insulin aspart  0-15 Units Subcutaneous Q4H  . lactulose  10 g Oral BID  . lamoTRIgine  62.5 mg Oral QHS  . lurasidone  60 mg Oral QHS  . mouth rinse  15 mL Mouth Rinse BID  . pantoprazole  40 mg Oral BID AC  . QUEtiapine  25 mg Oral QHS  . rifaximin  550 mg Oral QPM  . tamsulosin  0.4 mg Oral QPC breakfast    Continuous Infusions: . sodium chloride 500 mL (10/28/18 1137)  . cefTRIAXone (ROCEPHIN)  IV 2 g (10/28/18 1138)    PRN Meds: sodium chloride, acetaminophen, ALPRAZolam, Gerhardt's butt cream, morphine injection, [DISCONTINUED] ondansetron **OR** ondansetron (ZOFRAN) IV, polyethylene glycol  Physical Exam Constitutional:      Appearance: Normal appearance.  Pulmonary:     Effort: Pulmonary effort is normal.             Vital Signs: BP (!) 121/57 (BP Location: Right Arm)   Pulse 61   Temp 97.9 F (36.6 C) (Oral)   Resp 17   Ht 5\' 10"  (1.778 m)   Wt 101.6 kg   SpO2 97%   BMI 32.14 kg/m    SpO2: SpO2: 97 % O2 Device: O2 Device: Room Air O2 Flow Rate: O2 Flow Rate (L/min): 2 L/min  Intake/output summary:   Intake/Output Summary (Last 24 hours) at 10/28/2018 1455 Last data filed at 10/28/2018 1411 Gross per 24 hour  Intake 120 ml  Output 1200 ml  Net -1080 ml  LBM: Last BM Date: 10/27/18 Baseline Weight: Weight: 101.6 kg Most recent weight: Weight: 101.6 kg       Palliative Assessment/Data: 40%    Flowsheet Rows     Most Recent Value  Intake Tab  Referral Department  Hospitalist  Unit at Time of Referral  ICU  Palliative Care Primary Diagnosis  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 GI bleed 10/23/2018  . Secondary esophageal varices with bleeding ([place])   . Acute gastrointestinal hemorrhage   . Acute blood loss anemia   . Hyperammonemia ([place]) 06/28/2018  . Hepatic failure, unspecified without coma ([place]) 06/02/2018  . Anemia 05/26/2018  . Dyslipidemia associated with type 2 diabetes mellitus ([place]) 05/22/2018  . GERD without esophagitis 05/22/2018  . Somnolence 05/22/2018  . Bipolar 1 disorder ([place]) 05/12/2018  . Acute right hip pain 04/28/2018  . Frequent falls 04/24/2018  . Expressive aphasia 04/24/2018  . Left-sided Bell's palsy 03/03/2018  . Diabetic peripheral neuropathy ([place]) 03/03/2018  . Abnormal LFTs 09/08/2017  . 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 ED (erectile dysfunction) 09/08/2017  . Environmental allergies 09/08/2017  . Nephrolithiasis 09/08/2017  . Non-cardiac chest pain 09/08/2017  . Rectal bleeding 09/08/2017   . Sinusitis 09/08/2017  . Varicose veins of leg with swelling, bilateral 06/12/2017  . Venous ulcer ([place]) 05/18/2017  . Chronic venous insufficiency 05/18/2017  . Memory disorder 02/12/2017  . Gait abnormality 02/12/2017  . CKD stage 3 due to type 2 diabetes mellitus ([place]) 02/06/2017  . Hyperlipidemia 02/06/2017  . Swelling of limb 02/06/2017  . Lymphedema 02/06/2017  . Heme positive stool 12/04/2016  . Mixed dementia ([place]) 07/29/2016  . Cardiac pacemaker in situ 04/27/2016  . AV heart block 04/24/2016  . Hyperglycemia 04/24/2016  . Generalized weakness 04/24/2016  . Syncope and collapse 04/24/2016  . Metal bone fixation hardware in place 04/21/2016  . Bilateral carotid artery stenosis 02/05/2015  . OSA (obstructive sleep apnea) 02/05/2015  . Hyperlipidemia due to type 2 diabetes mellitus ([place]) 02/05/2015  . Benign essential hypertension 01/19/2015  . Hypertension associated with diabetes ([place]) 01/19/2015  . B12 deficiency 05/23/2014  . Morbid obesity with alveolar hypoventilation ([place]) 05/23/2014  . Long-term insulin use ([place]) 05/22/2014  . Type II diabetes mellitus with manifestations, uncontrolled ([place]) 05/22/2014  . Aortic stenosis, moderate 11/14/2013  . ANEMIA-NOS 03/29/2007  . Anxiety 03/29/2007  . DEPRESSION 03/29/2007  . ALLERGIC RHINITIS 03/29/2007  . ASTHMA 03/29/2007  . Osteoarthritis 03/29/2007  . NEPHROLITHIASIS, HX OF 03/29/2007    Code Status:    Code Status Orders  (From admission, onward)         Start     Ordered   10/23/18 1405  Do not attempt resuscitation (DNR)  Continuous    Question Answer Comment  In the event of cardiac or respiratory ARREST Do not call a "code blue"   In the event of cardiac or respiratory ARREST Do not perform Intubation, CPR, defibrillation or ACLS   In the event of cardiac or respiratory ARREST Use  medication by any route, position, wound care, and other measures to relive pain and suffering. May use oxygen, suction  and manual treatment of airway obstruction as needed for comfort.      10/23/18 1404        Code Status History    Date Active Date Inactive Code Status Order ID Comments User Context   05/26/2018 1706 06/01/2018 2113 DNR [id number]  Saundra Shelling, MD ED   04/25/2018 1126 04/27/2018 2050 DNR [id number]  Bettey Costa, MD Inpatient   04/25/2018 0051 04/25/2018 1126 Full Code [id number]  Lance Coon, MD Inpatient        Thank you for allowing the Palliative Medicine Team to assist in the care of this patient.   Total Time 15 min Prolonged Time Billed no      Greater than 50%  of this time was spent counseling and coordinating care related to the above assessment and plan.  Asencion Gowda, NP  Please contact Palliative Medicine Team phone at [phone] for questions and concerns.

## 2018-10-28 NOTE — Progress Notes (Signed)
   10/28/18 1300  Clinical Encounter Type  Visited With Patient and family together  Visit Type Follow-up  Spiritual Encounters  Spiritual Needs Emotional  Ch talked to pt's son [name] who is a primary caregiver. Son and daughter-in-law live with the pt providing main care. Son disagrees on the point of pt care with his brother. Pt would like to be the main contact person when it comes to caring for his father and not his brother. Ch offered emotional support.

## 2018-10-29 LAB — GLUCOSE, CAPILLARY
Glucose-Capillary: 161 mg/dL — ABNORMAL HIGH (ref 70–99)
Glucose-Capillary: 223 mg/dL — ABNORMAL HIGH (ref 70–99)

## 2018-10-29 LAB — CBC
HCT: 25.6 % — ABNORMAL LOW (ref 39.0–52.0)
Hemoglobin: 8.2 g/dL — ABNORMAL LOW (ref 13.0–17.0)
MCH: 24 pg — ABNORMAL LOW (ref 26.0–34.0)
MCHC: 32 g/dL (ref 30.0–36.0)
MCV: 74.9 fL — ABNORMAL LOW (ref 80.0–100.0)
NRBC: 0 % (ref 0.0–0.2)
Platelets: 124 10*3/uL — ABNORMAL LOW (ref 150–400)
RBC: 3.42 MIL/uL — ABNORMAL LOW (ref 4.22–5.81)
RDW: 24 % — ABNORMAL HIGH (ref 11.5–15.5)
WBC: 5.6 10*3/uL (ref 4.0–10.5)

## 2018-10-29 MED ORDER — ACETAMINOPHEN 325 MG PO TABS: 650.0000 mg | ORAL_TABLET | Freq: Four times a day (QID) | ORAL | Status: AC | PRN

## 2018-10-29 MED ORDER — OMEPRAZOLE 40 MG PO CPDR: 40.0000 mg | DELAYED_RELEASE_CAPSULE | Freq: Two times a day (BID) | ORAL | 0 refills | Status: AC

## 2018-10-29 MED ORDER — POLYETHYLENE GLYCOL 3350 17 G PO PACK: 17.0000 g | PACK | Freq: Every day | ORAL | 0 refills | Status: AC | PRN

## 2018-10-29 NOTE — Discharge Instructions (Signed)
Follow-up with primary care physician in 2 to 3 days Follow-up with gastroenterology in a week Follow-up with [facility] in March as scheduled for renal mass biopsy

## 2018-10-29 NOTE — Discharge Summary (Signed)
[facility] NAME: [name] Day    MR#:  [id number]  DATE OF BIRTH:  05-26-1941  DATE OF ADMISSION:  10/23/2018 ADMITTING PHYSICIAN: Fritzi Mandes, MD  DATE OF DISCHARGE: 10/29/18  PRIMARY CARE PHYSICIAN: Idelle Crouch, MD    ADMISSION DIAGNOSIS:  Acute blood loss anemia [D62] UGIB (upper gastrointestinal bleed) [K92.2] Fall, initial encounter [W19.XXXA]  DISCHARGE DIAGNOSIS:  Active Problems:   GI bleed   Secondary esophageal varices with bleeding (HCC)   Acute gastrointestinal hemorrhage   Acute blood loss anemia   SECONDARY DIAGNOSIS:   Past Medical History:  Diagnosis Date  . Able to transfer from wheelchair to chair    per family  . Anemia   . Bipolar 1 disorder ([place])   . Cancer ([place])   . Carotid artery occlusion   . Cirrhosis ([place])   . Dementia ([place])   . Diabetes mellitus without complication (HCC)    diet controlled  . Diabetic peripheral neuropathy ([place]) 03/03/2018  . Gait abnormality 02/12/2017  . Hypertension   . Left-sided Bell's palsy 03/03/2018  . Memory disorder 02/12/2017  . 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 Right renal mass   . Sleep apnea    non compliant with CPAP  . Stroke ([place]) 04/2018   left sided weakness  . TIA (transient ischemic attack)   . Wears dentures    partials    HOSPITAL COURSE:   WilliamFranklinis [phone] y.o.malewith a known history of nonalcoholic cirrhosis of liver, bipolar disorder, diabetes, dementia, hypertension, issues with memory, renal mass(not pursuing workup), history of a fib on oral anticoagulation, history of AV block status post pacemaker and sleep apnea comes to the emergency room due to frequent falls at home last night.  1.G.I. bleed/hematemesis in the setting of nonalcoholic cirrhosis of liver suspect variceal bleed versus rule out peptic ulcer  disease Clinically doing better -IV fluids --IV Protonix drip -changed to Protonix p.o. twice daily - IV Octreotidedrip-continued for 72 hours and stopped -Rocephin IV for 5 days for SBP prophylaxis as recommended by gastroenterology-started on 10/25/2018-continued until 10/29/2018 Friday -GI did endoscopy which has revealed grade 3 esophageal varices incomplete eradication, banding done.  Repeat EGD in 2-3 weeks approximately.  Follow-up with gastroenterology Dr. Vicente Males - transfuse as needed -His hemoglobin 2 1/2 weeks ago was 11.4--hemoglobin today 7.7--1 unit PRBC--hgb-6.8--7.1--8.2-7.3-7.6-8.2-7.8-7.6-7.8-8.2   2.Chronic atrial fibrillation -patient was on eliquis currently it is on hold -hold oral anticoagulation for now, discussed with the gastroenterology Dr. Vicente Males not recommending to resume Eliquis at this time If heart rate is greater than 65 we will add nadolol  3.Nonalcoholic cirrhosis of liver -this was diagnosed about 3 to 6 months ago -follows with Dr. Ricky Stabs outpatient -patient has chronically elevated bilirubin and chronically elevated ammonia -xifaxan -Rocephin for SBP prophylaxis and patient is on cephalexin  4.Frequent falls -physical therapy consult-recommending skilled nursing facility patient is refusing.  They want to go home with home health  5.Type II diabetes -sliding scale insulin  6.Bipolar disorder -Resume home medications Lamictal, Seroquel  7. Renal cell mass- prob cancer Patient has an appointment with [facility] in March for possible biopsy   8. Failure to thrive with poor prognosis Palliative care consult placed as requested by son and daughter-in-law at bedside.  Crystal NP met the family -family is agreeable for outpatient palliative care  9.  Generalized weakness PT consult-recommending  skilled nursing facility.  Patient and family are refusing they prefer going home with home health Family is not considering outpatient palliative  care at this time DISCHARGE CONDITIONS:   fair  CONSULTS OBTAINED:     PROCEDURES EGD with banding  DRUG ALLERGIES:   Allergies  Allergen Reactions  . Lisinopril Other (See Comments)    REACTION: Cough  . Penicillins Other (See Comments)    Reaction: unknown childhood reaction Has patient had a PCN reaction causing immediate rash, facial/tongue/throat swelling, SOB or lightheadedness with hypotension: Unknown Has patient had a PCN reaction causing severe rash involving mucus membranes or skin necrosis: Unknown Has patient had a PCN reaction that required hospitalization: Unknown Has patient had a PCN reaction occurring within the last 10 years: Unknown If all of the above answers are "NO", then may proceed with Cephalosporin use.   . Clindamycin/Lincomycin Rash and Other (See Comments)    "turns me purple" "turns me orange"    DISCHARGE MEDICATIONS:   Allergies as of 10/29/2018      Reactions   Lisinopril Other (See Comments)   REACTION: Cough   Penicillins Other (See Comments)   Reaction: unknown childhood reaction Has patient had a PCN reaction causing immediate rash, facial/tongue/throat swelling, SOB or lightheadedness with hypotension: Unknown Has patient had a PCN reaction causing severe rash involving mucus membranes or skin necrosis: Unknown Has patient had a PCN reaction that required hospitalization: Unknown Has patient had a PCN reaction occurring within the last 10 years: Unknown If all of the above answers are "NO", then may proceed with Cephalosporin use.   Clindamycin/lincomycin Rash, Other (See Comments)   "turns me purple" "turns me orange"      Medication List    STOP taking these medications   ELIQUIS 5 MG Tabs tablet Generic drug:  apixaban   NON FORMULARY     TAKE these medications   acetaminophen 325 MG tablet Commonly known as:  TYLENOL Take 2 tablets (650 mg total) by mouth every 6 (six) hours as needed for moderate pain.    allopurinol 300 MG tablet Commonly known as:  ZYLOPRIM Take 1 tablet (300 mg total) by mouth daily as needed (gout).   ALPRAZolam 0.5 MG tablet Commonly known as:  XANAX Take 0.5 mg by mouth at bedtime as needed for anxiety.   FREESTYLE LITE test strip Generic drug:  glucose blood 1 each by Other route as directed.   gabapentin 100 MG capsule Commonly known as:  NEURONTIN Take 2 capsules (200 mg total) by mouth 3 (three) times daily. What changed:    how much to take  when to take this   lactulose 10 GM/15ML solution Commonly known as:  CHRONULAC Take 15 mLs (10 g total) by mouth daily. What changed:  when to take this   lamoTRIgine 25 MG tablet Commonly known as:  LAMICTAL Take 2 tablets (50 mg total) by mouth 2 (two) times daily. What changed:    how much to take  when to take this   lidocaine 5 % Commonly known as:  LIDODERM Place 1 patch onto the skin daily. Remove & Discard patch within 12 hours or as directed by MD   liver oil-zinc oxide 40 % ointment Commonly known as:  DESITIN Apply topically 3 (three) times daily as needed for irritation.   Lurasidone HCl 60 MG Tabs Commonly known as:  LATUDA Take 1 tablet (60 mg total) by mouth daily. What changed:    how much to take  when to take this   metFORMIN 500 MG tablet Commonly known as:  GLUCOPHAGE Take 500 mg by mouth daily as needed (high blood sugar).   omeprazole 40 MG capsule Commonly known as:  PRILOSEC Take 1 capsule (40 mg total) by mouth 2 (two) times daily. What changed:  when to take this   polyethylene glycol packet Commonly known as:  MIRALAX / GLYCOLAX Take 17 g by mouth daily as needed for mild constipation.   QUEtiapine 25 MG tablet Commonly known as:  SEROQUEL Take 25 mg by mouth at bedtime.   tamsulosin 0.4 MG Caps capsule Commonly known as:  FLOMAX Take 1 capsule (0.4 mg total) by mouth 2 (two) times daily. What changed:  when to take this   XIFAXAN 550 MG Tabs  tablet Generic drug:  rifaximin Take 550 mg by mouth every evening.        DISCHARGE INSTRUCTIONS:   Follow-up with primary care physician in 2 to 3 days Follow-up with gastroenterology in a week Follow-up with [facility] in March as scheduled for renal mass biopsy  DIET:  Cardiac diet and Diabetic diet  DISCHARGE CONDITION:  Fair  ACTIVITY:  Activity as tolerated  OXYGEN:  Home Oxygen: No.   Oxygen Delivery: room air  DISCHARGE LOCATION:  home   If you experience worsening of your admission symptoms, develop shortness of breath, life threatening emergency, suicidal or homicidal thoughts you must seek medical attention immediately by calling 911 or calling your MD immediately  if symptoms less severe.  You Must read complete instructions/literature along with all the possible adverse reactions/side effects for all the Medicines you take and that have been prescribed to you. Take any new Medicines after you have completely understood and accpet all the possible adverse reactions/side effects.   Please note  You were cared for by a hospitalist during your hospital stay. If you have any questions about your discharge medications or the care you received while you were in the hospital after you are discharged, you can call the unit and asked to speak with the hospitalist on call if the hospitalist that took care of you is not available. Once you are discharged, your primary care physician will handle any further medical issues. Please note that NO REFILLS for any discharge medications will be authorized once you are discharged, as it is imperative that you return to your primary care physician (or establish a relationship with a primary care physician if you do not have one) for your aftercare needs so that they can reassess your need for medications and monitor your lab values.     Today  Chief Complaint  Patient presents with  . Fall  . Dysuria   Patient is doing fine.  No  other episodes of bleeding.  Completed 5 days of IV Rocephin.  Patient and family are ready to take him home with home health  ROS:  CONSTITUTIONAL: Denies fevers, chills. Denies any fatigue, weakness.  EYES: Denies blurry vision, double vision, eye pain. EARS, NOSE, THROAT: Denies tinnitus, ear pain, hearing loss. RESPIRATORY: Denies cough, wheeze, shortness of breath.  CARDIOVASCULAR: Denies chest pain, palpitations, edema.  GASTROINTESTINAL: Denies nausea, vomiting, diarrhea, abdominal pain. Denies bright red blood per rectum. GENITOURINARY: Denies dysuria, hematuria. ENDOCRINE: Denies nocturia or thyroid problems. HEMATOLOGIC AND LYMPHATIC: Denies easy bruising or bleeding. SKIN: Denies rash or lesion. MUSCULOSKELETAL: Denies pain in neck, back, shoulder, knees, hips or arthritic symptoms.  NEUROLOGIC: Denies paralysis, paresthesias.  PSYCHIATRIC: Denies anxiety or depressive symptoms.  VITAL SIGNS:  Blood pressure (!) 116/53, pulse 60, temperature 98 F (36.7 C), temperature source Oral, resp. rate 14, height _0  (1.778 m), weight 101.6 kg, SpO2 96 %.  I/O:    Intake/Output Summary (Last 24 hours) at 10/29/2018 1443 Last data filed at 10/29/2018 0300 Gross per 24 hour  Intake 237 ml  Output 1300 ml  Net -1063 ml    PHYSICAL EXAMINATION:  GENERAL:  78 y.o.-year-old patient lying in the bed with no acute distress.  EYES: Pupils equal, round, reactive to light and accommodation. No scleral icterus. Extraocular muscles intact.  HEENT: Head atraumatic, normocephalic. Oropharynx and nasopharynx clear.  NECK:  Supple, no jugular venous distention. No thyroid enlargement, no tenderness.  LUNGS: Normal breath sounds bilaterally, no wheezing, rales,rhonchi or crepitation. No use of accessory muscles of respiration.  CARDIOVASCULAR: S1, S2 normal. No murmurs, rubs, or gallops.  ABDOMEN: Soft, non-tender, non-distended. Bowel sounds present.  EXTREMITIES: No pedal edema, cyanosis,  or clubbing.  NEUROLOGIC: Awake alert and oriented x2-3. Sensation intact. Gait not checked.  PSYCHIATRIC: The patient is alert and oriented x 2-3.  SKIN: No obvious rash, lesion, or ulcer.   DATA REVIEW:   CBC Recent Labs  Lab 10/29/18 0604  WBC 5.6  HGB 8.2*  HCT 25.6*  PLT 124*    Chemistries  Recent Labs  Lab 10/25/18 0420 10/27/18 0616  NA 140 141  K 4.1 3.4*  CL 113* 111  CO2 21* 23  GLUCOSE 178* 158*  BUN 75* 35*  CREATININE 1.18 0.92  CALCIUM 7.9* 7.7*  AST 30  --   ALT 16  --   ALKPHOS 57  --   BILITOT 3.7*  --     Cardiac Enzymes Recent Labs  Lab 10/23/18 1144  TROPONINI <0.03    Microbiology Results  Results for orders placed or performed during the hospital encounter of 10/23/18  MRSA PCR Screening     Status: None   Collection Time: 10/23/18  2:21 PM  Result Value Ref Range Status   MRSA by PCR NEGATIVE NEGATIVE Final    Comment:        The GeneXpert MRSA Assay (FDA approved for NASAL specimens only), is one component of a comprehensive MRSA colonization surveillance program. It is not intended to diagnose MRSA infection nor to guide or monitor treatment for MRSA infections. Performed at [facility], [name], [place], [place] [address]   Culture, Urine     Status: Abnormal   Collection Time: 10/25/18 12:29 AM  Result Value Ref Range Status   Specimen Description   Final    URINE, RANDOM Performed at [facility], [address], [place], [place] [address]    Special Requests   Final    NONE Performed at [facility], [address] [place], [place] [address]    Culture (A)  Final    <10,000 COLONIES/mL INSIGNIFICANT GROWTH Performed at [facility] [address] [place], [place] [address]    Report Status 10/26/2018 FINAL  Final    RADIOLOGY:  No results found.  EKG:   Orders placed or performed during the hospital encounter of 10/23/18  . ED EKG  . ED EKG       Management plans discussed with the patient, family and they are in agreement.  CODE STATUS:     Code Status Orders  (From admission, onward)         Start     Ordered  10/23/18 1405  Do not attempt resuscitation (DNR)  Continuous    Question Answer Comment  In the event of cardiac or respiratory ARREST Do not call a "code blue"   In the event of cardiac or respiratory ARREST Do not perform Intubation, CPR, defibrillation or ACLS   In the event of cardiac or respiratory ARREST Use medication by any route, position, wound care, and other measures to relive pain and suffering. May use oxygen, suction and manual treatment of airway obstruction as needed for comfort.      10/23/18 1404        Code Status History    Date Active Date Inactive Code Status Order ID Comments User Context   05/26/2018 1706 06/01/2018 2113 DNR [id number]  Saundra Shelling, MD ED   04/25/2018 1126 04/27/2018 2050 DNR [id number]  Bettey Costa, MD Inpatient   04/25/2018 0051 04/25/2018 1126 Full Code [id number]  Lance Coon, MD Inpatient      TOTAL TIME TAKING CARE OF THIS PATIENT: 45 minutes.   Note: This dictation was prepared with Dragon dictation along with smaller phrase technology. Any transcriptional errors that result from this process are unintentional.   _0 @  on 10/29/2018 at 9:22 AM  Between 7am to 6pm - Pager - [phone]  After 6pm go to [email or web address] - password [email or web address] [facility]  Office  [phone]  CC: Primary care physician; Idelle Crouch, MD

## 2018-10-29 NOTE — Progress Notes (Signed)
Family at bedside refused bladder scan for patient at 58.

## 2018-10-29 NOTE — Care Management (Signed)
Discharge to home today per Dr. Margaretmary Eddy. [place] will be in the home. Drue Novel, [facility] representative updated.  Also will need in & out catheterization supplies.  Spoke with [employer]. States that [facility] will order catheterization supplies. Family updated. Drue Novel, [facility] representative updated. Family will transport Shelbie Ammons RN MSN CCM Care Management [phone]

## 2018-10-29 NOTE — Progress Notes (Signed)
Received MD order to discharge patient reviewed home meds, discharge instructions, follow up appointments and prescriptions with patient and family and all verbalized understanding

## 2018-10-30 LAB — GLUCOSE, CAPILLARY: Glucose-Capillary: 162 mg/dL — ABNORMAL HIGH (ref 70–99)

## 2018-11-01 ENCOUNTER — Telehealth: Payer: Self-pay

## 2018-11-01 NOTE — Telephone Encounter (Signed)
RN call to patient to schedule palliative care appointment. Pt refused palliative services at this time, notified referral intake.

## 2018-11-02 ENCOUNTER — Other Ambulatory Visit: Payer: Self-pay

## 2018-11-02 ENCOUNTER — Encounter: Payer: Self-pay | Admitting: Gastroenterology

## 2018-11-02 ENCOUNTER — Ambulatory Visit (INDEPENDENT_AMBULATORY_CARE_PROVIDER_SITE_OTHER): Payer: Medicare Other | Admitting: Gastroenterology

## 2018-11-02 ENCOUNTER — Telehealth: Payer: Self-pay | Admitting: Gastroenterology

## 2018-11-02 VITALS — BP 107/70 | HR 112 | Ht 70.0 in | Wt 246.6 lb

## 2018-11-02 DIAGNOSIS — D649 Anemia, unspecified: Secondary | ICD-10-CM

## 2018-11-02 DIAGNOSIS — E538 Deficiency of other specified B group vitamins: Secondary | ICD-10-CM

## 2018-11-02 DIAGNOSIS — K921 Melena: Secondary | ICD-10-CM

## 2018-11-02 NOTE — Progress Notes (Signed)
Jonathon Bellows MD, MRCP(U.K) [address]  [place]  [place], [place] [address]  Main: [phone]  Fax: [phone]   Primary Care Physician: Idelle Crouch, MD  Primary Gastroenterologist:  Dr. Jonathon Bellows   No chief complaint on file.   HPI: [name] Day is a 78 y.o. male   Summary of history :  He is today here to see me for hospital follow-up.  Going back in time he has been a patient of Dr. Vira Agar in the past seen for heme-positive stools in April 2018.  At that point of time he had actually had a microcytic anemia with a hemoglobin of 10.8 g with an MCV of 70.  He had an upper endoscopy and a colonoscopy which demonstrated an portal hypertensive gastropathy and a diminutive polyp that was excised.  For unknown reasons he did not have a complete work-up with a capsule study of the small bowel.  In 05/21/2018 he presented to the hospital with weakness and was found to have a hemoglobin of 9.5 g.  He had been on aspirin and Motrin at that point of time.  His MCV was 68.5.  He had not had any overt blood loss.  He has had issues with his memory.  Point of time endoscopy procedures were deferred due to a change in his neurological status.  He had declined any endoscopy procedures at that point of time.  He was yet again seen in the hospital as an inpatient on 10/23/2018 for hematemesis.  Globin had dropped over the recent period of time.  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 On 10/24/2018 had an upper endoscopy and the esophageal varices that were banded.  Heart rate was 60 bpm and was not commenced on nadolol.  He was treated with octreotide and discharged.   Interval history   10/23/2018-  11/01/2018  Brown bowel movements , no more bleeding, no confusion , 2 soft bowel movements a day , no dirrhea. No NSAID's. Here with a family member.    Current Outpatient Medications  Medication Sig Dispense Refill    . acetaminophen (TYLENOL) 325 MG tablet Take 2 tablets (650 mg total) by mouth every 6 (six) hours as needed for moderate pain.    Marland Kitchen allopurinol (ZYLOPRIM) 300 MG tablet Take 1 tablet (300 mg total) by mouth daily as needed (gout). 30 tablet 0  . ALPRAZolam (XANAX) 0.5 MG tablet Take 0.5 mg by mouth at bedtime as needed for anxiety.    Marland Kitchen FREESTYLE LITE test strip 1 each by Other route as directed. 100 each 0  . gabapentin (NEURONTIN) 100 MG capsule Take 2 capsules (200 mg total) by mouth 3 (three) times daily. (Patient taking differently: Take 100 mg by mouth 2 (two) times daily. ) 180 capsule 0  . lactulose (CHRONULAC) 10 GM/15ML solution Take 15 mLs (10 g total) by mouth daily. (Patient taking differently: Take 10 g by mouth 2 (two) times daily. ) 240 mL 0  . lamoTRIgine (LAMICTAL) 25 MG tablet Take 2 tablets (50 mg total) by mouth 2 (two) times daily. (Patient taking differently: Take 60 mg by mouth at bedtime. ) 180 tablet 0  . lidocaine (LIDODERM) 5 % Place 1 patch onto the skin daily. Remove & Discard patch within 12 hours or as directed by MD 30 patch 0  . liver oil-zinc oxide (DESITIN) 40 % ointment Apply topically 3 (three) times daily  as needed for irritation. 56.7 g 0  . Lurasidone HCl (LATUDA) 60 MG TABS Take 1 tablet (60 mg total) by mouth daily. (Patient taking differently: Take 30 mg by mouth at bedtime. ) 30 tablet 0  . metFORMIN (GLUCOPHAGE) 500 MG tablet Take 500 mg by mouth daily as needed (high blood sugar).    Marland Kitchen omeprazole (PRILOSEC) 40 MG capsule Take 1 capsule (40 mg total) by mouth 2 (two) times daily. 30 capsule 0  . polyethylene glycol (MIRALAX / GLYCOLAX) packet Take 17 g by mouth daily as needed for mild constipation. 14 each 0  . QUEtiapine (SEROQUEL) 25 MG tablet Take 25 mg by mouth at bedtime.    . tamsulosin (FLOMAX) 0.4 MG CAPS capsule Take 1 capsule (0.4 mg total) by mouth 2 (two) times daily. (Patient taking differently: Take 0.4 mg by mouth daily. ) 60 capsule 0   . XIFAXAN 550 MG TABS tablet Take 550 mg by mouth every evening.      No current facility-administered medications for this visit.     Allergies as of 11/02/2018 - Review Complete 10/23/2018  Allergen Reaction Noted  . Lisinopril Other (See Comments)   . Penicillins Other (See Comments)   . Clindamycin/lincomycin Rash and Other (See Comments) 04/24/2016    ROS:  General: Negative for anorexia, weight loss, fever, chills, fatigue, weakness. ENT: Negative for hoarseness, difficulty swallowing , nasal congestion. CV: Negative for chest pain, angina, palpitations, dyspnea on exertion, peripheral edema.  Respiratory: Negative for dyspnea at rest, dyspnea on exertion, cough, sputum, wheezing.  GI: See history of present illness. GU:  Negative for dysuria, hematuria, urinary incontinence, urinary frequency, nocturnal urination.  Endo: Negative for unusual weight change.    Physical Examination:   There were no vitals taken for this visit.  General: Well-nourished, well-developed in no acute distress.  Eyes: No icterus. Conjunctivae pink. Mouth: Oropharyngeal mucosa moist and pink , no lesions erythema or exudate. Lungs: Clear to auscultation bilaterally. Non-labored. Heart: Regular rate and rhythm, no murmurs rubs or gallops.  Abdomen: Bowel sounds are normal, nontender, nondistended, no hepatosplenomegaly or masses, no abdominal bruits or hernia , no rebound or guarding.   Extremities: No lower extremity edema. No clubbing or deformities. Neuro: Alert and oriented x 3.  Grossly intact. Skin: Warm and dry, no jaundice.   Psych: Alert and cooperative, normal mood and affect.   Imaging Studies: Ct Head Wo Contrast  Result Date: 10/23/2018 CLINICAL DATA:  Patient fell 3 times yesterday. EXAM: CT HEAD WITHOUT CONTRAST CT CERVICAL SPINE WITHOUT CONTRAST TECHNIQUE: Multidetector CT imaging of the head and cervical spine was performed following the standard protocol without intravenous  contrast. Multiplanar CT image reconstructions of the cervical spine were also generated. COMPARISON:  Head CT 07/31/2018. FINDINGS: CT HEAD FINDINGS Brain: There is no evidence for acute hemorrhage, hydrocephalus, mass lesion, or abnormal extra-axial fluid collection. No definite CT evidence for acute infarction. Diffuse loss of parenchymal volume is consistent with atrophy. Patchy low attenuation in the deep hemispheric and periventricular white matter is nonspecific, but likely reflects chronic microvascular ischemic demyelination. 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 Vascular: No hyperdense vessel or unexpected calcification. Skull: No evidence for fracture. No worrisome lytic or sclerotic lesion. Sinuses/Orbits: Chronic opacification of the right maxillary sinus and right mastoid air cells. Opacification of the right sphenoid sinus is new since prior study. Other: None. CT CERVICAL SPINE FINDINGS Alignment: Straightening of normal cervical lordosis evident. Trace anterolisthesis of C3 on 4 likely  related to associated facet disease. Skull base and vertebrae: No acute fracture. No primary bone lesion or focal pathologic process. Soft tissues and spinal canal: No prevertebral fluid or swelling. No visible canal hematoma. Disc levels: Loss of disc height with endplate degeneration evident at C5-6 and C6-7. Upper cervical facet degeneration noted bilaterally. Upper chest: Negative. Other: None. IMPRESSION: 1. No acute intracranial abnormality. 2. Atrophy with chronic small vessel white matter ischemic disease. 3. Chronic right maxillary and ethmoid sinus disease. Opacification of the right sphenoid sinus is new since prior study. 4. Degenerative changes in the cervical spine without fracture. 5. Loss of cervical lordosis. This can be related to patient positioning, muscle spasm or soft tissue injury. Electronically Signed   By: Misty Stanley M.D.   On: 10/23/2018 13:23   Ct Cervical Spine  Wo Contrast  Result Date: 10/23/2018 CLINICAL DATA:  Patient fell 3 times yesterday. EXAM: CT HEAD WITHOUT CONTRAST CT CERVICAL SPINE WITHOUT CONTRAST TECHNIQUE: Multidetector CT imaging of the head and cervical spine was performed following the standard protocol without intravenous contrast. Multiplanar CT image reconstructions of the cervical spine were also generated. COMPARISON:  Head CT 07/31/2018. FINDINGS: CT HEAD FINDINGS Brain: There is no evidence for acute hemorrhage, hydrocephalus, mass lesion, or abnormal extra-axial fluid collection. No definite CT evidence for acute infarction. Diffuse loss of parenchymal volume is consistent with atrophy. Patchy low attenuation in the deep hemispheric and periventricular white matter is nonspecific, but likely reflects chronic microvascular ischemic demyelination. 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 Vascular: No hyperdense vessel or unexpected calcification. Skull: No evidence for fracture. No worrisome lytic or sclerotic lesion. Sinuses/Orbits: Chronic opacification of the right maxillary sinus and right mastoid air cells. Opacification of the right sphenoid sinus is new since prior study. Other: None. CT CERVICAL SPINE FINDINGS Alignment: Straightening of normal cervical lordosis evident. Trace anterolisthesis of C3 on 4 likely related to associated facet disease. Skull base and vertebrae: No acute fracture. No primary bone lesion or focal pathologic process. Soft tissues and spinal canal: No prevertebral fluid or swelling. No visible canal hematoma. Disc levels: Loss of disc height with endplate degeneration evident at C5-6 and C6-7. Upper cervical facet degeneration noted bilaterally. Upper chest: Negative. Other: None. IMPRESSION: 1. No acute intracranial abnormality. 2. Atrophy with chronic small vessel white matter ischemic disease. 3. Chronic right maxillary and ethmoid sinus disease. Opacification of the right sphenoid sinus is new  since prior study. 4. Degenerative changes in the cervical spine without fracture. 5. Loss of cervical lordosis. This can be related to patient positioning, muscle spasm or soft tissue injury. Electronically Signed   By: Misty Stanley M.D.   On: 10/23/2018 13:23   Dg Chest Port 1 View  Result Date: 10/23/2018 CLINICAL DATA:  Intubation after endoscopy. EXAM: PORTABLE CHEST 1 VIEW COMPARISON:  07/31/2018 FINDINGS: 1532 hours. Endotracheal tube tip is 1.4 cm above the base the carina. Low lung volumes. The cardio pericardial silhouette is enlarged. Left base atelectasis noted. No edema or focal airspace consolidation. No substantial pleural effusion. Interstitial markings are diffusely coarsened with chronic features. Left permanent pacemaker again noted. Telemetry leads overlie the chest. Prominent gastric bubble. IMPRESSION: 1. Endotracheal tube tip 1.4 cm above the base of the carina. 2. Low volume film without left base atelectasis. 3. Prominent gastric bubble. Electronically Signed   By: Misty Stanley M.D.   On: 10/23/2018 17:33    Assessment and Plan:   [name] Day is a 78 y.o.  y/o male here to follow up for a hospital visit- S/p banding for esophageal varices. Has a renal mass seen on recent CT scan and will be seen at [facility] soon   Plan 1.  Repeat EGD plus banding in 2-3  weeks. 2.  No NSAIDs. 3.  Check iron studies, B12, folate.,  CBC.  CMP, INR. 4. 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 Continue Xifaxan23 5. Avoid excess tyelnol - limit to 500 mg a day if needed  I have discussed alternative options, risks & benefits,  which include, but are not limited to, bleeding, infection, perforation,respiratory complication & drug reaction.  The patient agrees with this plan & written consent will be obtained.    Dr Jonathon Bellows  MD,MRCP [facility]) Follow up in 2 months

## 2018-11-02 NOTE — Telephone Encounter (Signed)
[name] Day from [facility] left vm pt  Power of attorney [name] wanted her to call to retreive some information she is not sure what in reference too her cb [phone]

## 2018-11-03 LAB — CBC
Hematocrit: 31 % — ABNORMAL LOW (ref 37.5–51.0)
Hemoglobin: 9.6 g/dL — ABNORMAL LOW (ref 13.0–17.7)
MCH: 23 pg — ABNORMAL LOW (ref 26.6–33.0)
MCHC: 31 g/dL — ABNORMAL LOW (ref 31.5–35.7)
MCV: 74 fL — AB (ref 79–97)
Platelets: 167 10*3/uL (ref 150–450)
RBC: 4.17 x10E6/uL (ref 4.14–5.80)
RDW: 20.3 % — ABNORMAL HIGH (ref 11.6–15.4)
WBC: 6 10*3/uL (ref 3.4–10.8)

## 2018-11-03 LAB — COMPREHENSIVE METABOLIC PANEL
ALT: 16 IU/L (ref 0–44)
AST: 33 IU/L (ref 0–40)
Albumin/Globulin Ratio: 1.2 (ref 1.2–2.2)
Albumin: 3 g/dL — ABNORMAL LOW (ref 3.7–4.7)
Alkaline Phosphatase: 133 IU/L — ABNORMAL HIGH (ref 39–117)
BUN/Creatinine Ratio: 13 (ref 10–24)
BUN: 13 mg/dL (ref 8–27)
Bilirubin Total: 2.1 mg/dL — ABNORMAL HIGH (ref 0.0–1.2)
CO2: 18 mmol/L — ABNORMAL LOW (ref 20–29)
Calcium: 8.4 mg/dL — ABNORMAL LOW (ref 8.6–10.2)
Chloride: 103 mmol/L (ref 96–106)
Creatinine, Ser: 0.98 mg/dL (ref 0.76–1.27)
GFR calc Af Amer: 85 mL/min/{1.73_m2} (ref >59)
GFR calc non Af Amer: 74 mL/min/{1.73_m2} (ref >59)
Globulin, Total: 2.5 g/dL (ref 1.5–4.5)
Glucose: 179 mg/dL — ABNORMAL HIGH (ref 65–99)
Potassium: 4 mmol/L (ref 3.5–5.2)
SODIUM: 139 mmol/L (ref 134–144)
Total Protein: 5.5 g/dL — ABNORMAL LOW (ref 6.0–8.5)

## 2018-11-03 LAB — IRON,TIBC AND FERRITIN PANEL
Ferritin: 92 ng/mL (ref 30–400)
Iron Saturation: 14 % — ABNORMAL LOW (ref 15–55)
Iron: 34 ug/dL — ABNORMAL LOW (ref 38–169)
Total Iron Binding Capacity: 246 ug/dL — ABNORMAL LOW (ref 250–450)
UIBC: 212 ug/dL (ref 111–343)

## 2018-11-03 LAB — B12 AND FOLATE PANEL
Folate: 13.1 ng/mL (ref >3.0)
Vitamin B-12: 1132 pg/mL (ref 232–1245)

## 2018-11-03 LAB — PROTIME-INR
INR: 1.2 (ref 0.8–1.2)
Prothrombin Time: 12.6 s — ABNORMAL HIGH (ref 9.1–12.0)

## 2018-11-04 NOTE — Telephone Encounter (Signed)
Spoke with pt daughter,Heather, and informed her that we have received [name] Day cardiac clearance and blood thinner holding instructions for Eliquis. Pt daughter states pt has been off the Eliquis since his last procedure. Pt procedure has been scheduled.

## 2018-11-14 ENCOUNTER — Emergency Department: Payer: Medicare Other

## 2018-11-14 ENCOUNTER — Emergency Department
Admission: EM | Admit: 2018-11-14 | Discharge: 2018-11-15 | Disposition: A | Discharge status: Home / Self Care | Payer: Medicare Other | Attending: Emergency Medicine | Admitting: Emergency Medicine

## 2018-11-14 ENCOUNTER — Other Ambulatory Visit: Payer: Self-pay

## 2018-11-14 DIAGNOSIS — I129 Hypertensive chronic kidney disease with stage 1 through stage 4 chronic kidney disease, or unspecified chronic kidney disease: Secondary | ICD-10-CM | POA: Diagnosis not present

## 2018-11-14 DIAGNOSIS — Z859 Personal history of malignant neoplasm, unspecified: Secondary | ICD-10-CM | POA: Insufficient documentation

## 2018-11-14 DIAGNOSIS — K7460 Unspecified cirrhosis of liver: Secondary | ICD-10-CM

## 2018-11-14 DIAGNOSIS — Z79899 Other long term (current) drug therapy: Secondary | ICD-10-CM | POA: Diagnosis not present

## 2018-11-14 DIAGNOSIS — E1122 Type 2 diabetes mellitus with diabetic chronic kidney disease: Secondary | ICD-10-CM | POA: Diagnosis not present

## 2018-11-14 DIAGNOSIS — Z87891 Personal history of nicotine dependence: Secondary | ICD-10-CM | POA: Diagnosis not present

## 2018-11-14 DIAGNOSIS — J45909 Unspecified asthma, uncomplicated: Secondary | ICD-10-CM | POA: Insufficient documentation

## 2018-11-14 DIAGNOSIS — F0390 Unspecified dementia without behavioral disturbance: Secondary | ICD-10-CM | POA: Insufficient documentation

## 2018-11-14 DIAGNOSIS — Z950 Presence of cardiac pacemaker: Secondary | ICD-10-CM | POA: Diagnosis not present

## 2018-11-14 DIAGNOSIS — Z7984 Long term (current) use of oral hypoglycemic drugs: Secondary | ICD-10-CM | POA: Diagnosis not present

## 2018-11-14 DIAGNOSIS — N183 Chronic kidney disease, stage 3 (moderate): Secondary | ICD-10-CM | POA: Insufficient documentation

## 2018-11-14 DIAGNOSIS — R188 Other ascites: Secondary | ICD-10-CM | POA: Insufficient documentation

## 2018-11-14 DIAGNOSIS — R140 Abdominal distension (gaseous): Secondary | ICD-10-CM | POA: Diagnosis present

## 2018-11-14 LAB — COMPREHENSIVE METABOLIC PANEL
ALT: 14 U/L (ref 0–44)
AST: 36 U/L (ref 15–41)
Albumin: 2.9 g/dL — ABNORMAL LOW (ref 3.5–5.0)
Alkaline Phosphatase: 106 U/L (ref 38–126)
Anion gap: 7 (ref 5–15)
BUN: 13 mg/dL (ref 8–23)
CHLORIDE: 105 mmol/L (ref 98–111)
CO2: 24 mmol/L (ref 22–32)
Calcium: 8.5 mg/dL — ABNORMAL LOW (ref 8.9–10.3)
Creatinine, Ser: 0.93 mg/dL (ref 0.61–1.24)
GFR calc non Af Amer: >60 mL/min (ref >60)
Glucose, Bld: 213 mg/dL — ABNORMAL HIGH (ref 70–99)
Potassium: 4 mmol/L (ref 3.5–5.1)
Sodium: 136 mmol/L (ref 135–145)
Total Bilirubin: 1.5 mg/dL — ABNORMAL HIGH (ref 0.3–1.2)
Total Protein: 5.8 g/dL — ABNORMAL LOW (ref 6.5–8.1)

## 2018-11-14 LAB — CBC WITH DIFFERENTIAL/PLATELET
Abs Immature Granulocytes: 0 10*3/uL (ref 0.00–0.07)
Basophils Absolute: 0 10*3/uL (ref 0.0–0.1)
Basophils Relative: 1 %
Eosinophils Absolute: 0.1 10*3/uL (ref 0.0–0.5)
Eosinophils Relative: 4 %
HCT: 31.9 % — ABNORMAL LOW (ref 39.0–52.0)
Hemoglobin: 10.1 g/dL — ABNORMAL LOW (ref 13.0–17.0)
Immature Granulocytes: 0 %
Lymphocytes Relative: 32 %
Lymphs Abs: 1.1 10*3/uL (ref 0.7–4.0)
MCH: 22.4 pg — ABNORMAL LOW (ref 26.0–34.0)
MCHC: 31.7 g/dL (ref 30.0–36.0)
MCV: 70.9 fL — ABNORMAL LOW (ref 80.0–100.0)
Monocytes Absolute: 0.4 10*3/uL (ref 0.1–1.0)
Monocytes Relative: 11 %
Neutro Abs: 1.9 10*3/uL (ref 1.7–7.7)
Neutrophils Relative %: 52 %
PLATELETS: 87 10*3/uL — AB (ref 150–400)
RBC: 4.5 MIL/uL (ref 4.22–5.81)
RDW: 18.4 % — ABNORMAL HIGH (ref 11.5–15.5)
WBC: 3.6 10*3/uL — ABNORMAL LOW (ref 4.0–10.5)
nRBC: 0 % (ref 0.0–0.2)

## 2018-11-14 LAB — LIPASE, BLOOD: Lipase: 33 U/L (ref 11–51)

## 2018-11-14 LAB — PROTIME-INR
INR: 1.2 (ref 0.8–1.2)
Prothrombin Time: 15.5 seconds — ABNORMAL HIGH (ref 11.4–15.2)

## 2018-11-14 LAB — ETHANOL: Alcohol, Ethyl (B): <10 mg/dL (ref <10)

## 2018-11-14 MED ORDER — IOHEXOL 300 MG/ML  SOLN
100.0000 mL | Freq: Once | INTRAMUSCULAR | Status: AC | PRN
  Administered 2018-11-14: 100 mL via INTRAVENOUS

## 2018-11-14 NOTE — ED Notes (Signed)
Called lab and asked if someone could come collect blood that was ordered.

## 2018-11-14 NOTE — ED Notes (Signed)
Lab called and stated that pt needs a recollect on blue top. Lab informed that pt was a difficult stick and someone from lab would need to come and do the recollect.

## 2018-11-14 NOTE — ED Triage Notes (Signed)
Pt has had abd swelling for 3 weeks but today son stated that it doubled in 1930 pm. Pt stated that three weeks ago he had rubber bands placed and the pts doctor stated that if anything changed to bring him straight to the ER to make sure he was not bleeding again.

## 2018-11-15 ENCOUNTER — Emergency Department: Payer: Medicare Other

## 2018-11-15 DIAGNOSIS — K7460 Unspecified cirrhosis of liver: Secondary | ICD-10-CM | POA: Diagnosis not present

## 2018-11-15 LAB — TYPE AND SCREEN
ABO/RH(D): O POS
Antibody Screen: NEGATIVE

## 2018-11-15 LAB — AMMONIA: Ammonia: 12 umol/L (ref 9–35)

## 2018-11-15 MED ORDER — FUROSEMIDE 40 MG PO TABS: 40.0000 mg | ORAL_TABLET | Freq: Every day | ORAL | 0 refills | Status: AC

## 2018-11-15 MED ORDER — SPIRONOLACTONE 100 MG PO TABS: 100.0000 mg | ORAL_TABLET | Freq: Every day | ORAL | 0 refills | Status: DC

## 2018-11-15 NOTE — ED Provider Notes (Addendum)
[facility] Emergency Department Provider Note  ____________________________________________  Time seen: Approximately 2:56 AM  I have reviewed the triage vital signs and the nursing notes.   HISTORY  Chief Complaint Abdominal Pain    HPI [name] Day is a 78 y.o. male with a history of bipolar disorder, dementia, cirrhosis, diabetes hypertension and TIA who is brought to the ED due to abdominal swelling that was noticed about 7:30 PM tonight by family.  He has a history of esophageal varices, had gastric banding by Dr. Vicente Males with GI 3 weeks ago.  Denies any vomiting diarrhea or black or bloody stool.  Not on diuretics.  Family denies a history of ascites.  Report patient ate a normal dinner, has been feeling fine otherwise.  Patient states that he feels fine and denies any pain fevers chills sweats or other symptoms.      Past Medical History:  Diagnosis Date  . Able to transfer from wheelchair to chair    per family  . Anemia   . Bipolar 1 disorder ([place])   . Cancer ([place])   . Carotid artery occlusion   . Cirrhosis ([place])   . Dementia ([place])   . Diabetes mellitus without complication (HCC)    diet controlled  . Diabetic peripheral neuropathy ([place]) 03/03/2018  . Gait abnormality 02/12/2017  . Hypertension   . Left-sided Bell's palsy 03/03/2018  . Memory disorder 02/12/2017  . 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 Right renal mass   . Sleep apnea    non compliant with CPAP  . Stroke ([place]) 04/2018   left sided weakness  . TIA (transient ischemic attack)   . Wears dentures    partials     Patient Active Problem List   Diagnosis Date Noted  . GI bleed 10/23/2018  . Secondary esophageal varices with bleeding ([place])   . Acute gastrointestinal hemorrhage   . Acute blood loss anemia   . Hyperammonemia ([place]) 06/28/2018  . Hepatic failure, unspecified without coma  ([place]) 06/02/2018  . Anemia 05/26/2018  . Dyslipidemia associated with type 2 diabetes mellitus ([place]) 05/22/2018  . GERD without esophagitis 05/22/2018  . Somnolence 05/22/2018  . Bipolar 1 disorder ([place]) 05/12/2018  . Acute right hip pain 04/28/2018  . Frequent falls 04/24/2018  . Expressive aphasia 04/24/2018  . Left-sided Bell's palsy 03/03/2018  . Diabetic peripheral neuropathy ([place]) 03/03/2018  . Abnormal LFTs 09/08/2017  . 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 ED (erectile dysfunction) 09/08/2017  . Environmental allergies 09/08/2017  . Nephrolithiasis 09/08/2017  . Non-cardiac chest pain 09/08/2017  . Rectal bleeding 09/08/2017  . Sinusitis 09/08/2017  . Varicose veins of leg with swelling, bilateral 06/12/2017  . Venous ulcer ([place]) 05/18/2017  . Chronic venous insufficiency 05/18/2017  . Memory disorder 02/12/2017  . Gait abnormality 02/12/2017  . 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 Hyperlipidemia 02/06/2017  . Swelling of limb 02/06/2017  . Lymphedema 02/06/2017  . Heme positive stool 12/04/2016  . Mixed dementia ([place]) 07/29/2016  . Cardiac pacemaker in situ 04/27/2016  . AV heart block 04/24/2016  . Hyperglycemia 04/24/2016  . Generalized weakness 04/24/2016  . Syncope and collapse 04/24/2016  . Metal bone fixation hardware in place 04/21/2016  . Bilateral carotid artery stenosis 02/05/2015  . OSA (obstructive sleep apnea) 02/05/2015  . Hyperlipidemia due to type 2 diabetes mellitus ([place]) 02/05/2015  . Benign  essential hypertension 01/19/2015  . Hypertension associated with diabetes ([place]) 01/19/2015  . B12 deficiency 05/23/2014  . 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 Long-term insulin use ([place]) 05/22/2014  . Type II diabetes mellitus with manifestations, uncontrolled ([place]) 05/22/2014  . Aortic stenosis, moderate 11/14/2013  . ANEMIA-NOS 03/29/2007  . Anxiety 03/29/2007  . DEPRESSION 03/29/2007  . ALLERGIC  RHINITIS 03/29/2007  . ASTHMA 03/29/2007  . Osteoarthritis 03/29/2007  . NEPHROLITHIASIS, HX OF 03/29/2007     Past Surgical History:  Procedure Laterality Date  . CATARACT EXTRACTION W/PHACO Left 09/15/2018   Procedure: CATARACT EXTRACTION PHACO AND INTRAOCULAR LENS PLACEMENT ([place])  COMPLICATED LEFT DIABETIC;  Surgeon: Leandrew Koyanagi, MD;  Location: [facility];  Service: Ophthalmology;  Laterality: Left;  Diabetic - diet controlled sleep apnea  . COLONOSCOPY WITH PROPOFOL N/A 02/25/2017   Procedure: COLONOSCOPY WITH PROPOFOL;  Surgeon: Manya Silvas, MD;  Location: [facility] ENDOSCOPY;  Service: Endoscopy;  Laterality: N/A;  . ESOPHAGOGASTRODUODENOSCOPY N/A 10/23/2018   Procedure: ESOPHAGOGASTRODUODENOSCOPY (EGD);  Surgeon: [name], MD;  Location: [facility] ENDOSCOPY;  Service: Endoscopy;  Laterality: N/A;  . ESOPHAGOGASTRODUODENOSCOPY (EGD) WITH PROPOFOL N/A 02/25/2017   Procedure: ESOPHAGOGASTRODUODENOSCOPY (EGD) WITH PROPOFOL;  Surgeon: Manya Silvas, MD;  Location[facility] ENDOSCOPY;  Service: Endoscopy;  Laterality: N/A;  . NASAL SINUS SURGERY    . PACEMAKER PLACEMENT    . TONSILLECTOMY    . TONSILLECTOMY       Prior to Admission medications   Medication Sig Start Date End Date Taking? Authorizing Provider  acetaminophen (TYLENOL) 325 MG tablet Take 2 tablets (650 mg total) by mouth every 6 (six) hours as needed for moderate pain. 10/29/18   Nicholes Mango, MD  allopurinol (ZYLOPRIM) 300 MG tablet Take 1 tablet (300 mg total) by mouth daily as needed (gout). Patient not taking: Reported on 11/02/2018 05/12/18 05/12/19  Medina-Vargas, Monina C, NP  ALPRAZolam (XANAX) 0.5 MG tablet Take 0.5 mg by mouth at bedtime as needed for anxiety.    [provider]  FREESTYLE LITE test strip 1 each by Other route as directed. 05/12/18   Medina-Vargas, Monina C, NP  furosemide (LASIX) 40 MG tablet Take 1 tablet (40 mg total) by mouth daily for 15 days. 11/15/18 11/30/18  Carrie Mew, MD  gabapentin (NEURONTIN) 100 MG capsule Take 2 capsules (200 mg total) by mouth 3 (three) times daily. Patient taking differently: Take 100 mg by mouth 2 (two) times daily.  05/12/18 05/12/19  Medina-Vargas, Monina C, NP  lactulose (CHRONULAC) 10 GM/15ML solution Take 15 mLs (10 g total) by mouth daily. Patient taking differently: Take 10 g by mouth 2 (two) times daily.  06/02/18   Salary, Avel Peace, MD  lamoTRIgine (LAMICTAL) 25 MG tablet Take 2 tablets (50 mg total) by mouth 2 (two) times daily. Patient taking differently: Take 60 mg by mouth at bedtime.  05/12/18   Medina-Vargas, Monina C, NP  lidocaine (LIDODERM) 5 % Place 1 patch onto the skin daily. Remove & Discard patch within 12 hours or as directed by MD Patient not taking: Reported on 11/02/2018 06/01/18   Salary, Avel Peace, MD  liver oil-zinc oxide (DESITIN) 40 % ointment Apply topically 3 (three) times daily as needed for irritation. Patient not taking: Reported on 11/02/2018 06/01/18   Salary, Avel Peace, MD  losartan (COZAAR) 50 MG tablet  10/28/18   [provider]  Lurasidone HCl (LATUDA) 60 MG TABS Take 1 tablet (60 mg total) by mouth daily. Patient taking differently:  Take 30 mg by mouth at bedtime.  05/12/18   Medina-Vargas, Monina C, NP  metFORMIN (GLUCOPHAGE) 500 MG tablet Take 500 mg by mouth daily as needed (high blood sugar).    [provider]  omeprazole (PRILOSEC) 40 MG capsule Take 1 capsule (40 mg total) by mouth 2 (two) times daily. 10/29/18   Gouru, Illene Silver, MD  polyethylene glycol (MIRALAX / GLYCOLAX) packet Take 17 g by mouth daily as needed for mild constipation. Patient not taking: Reported on 11/02/2018 10/29/18   Nicholes Mango, MD  pravastatin (PRAVACHOL) 20 MG tablet  10/28/18   [provider]  QUEtiapine (SEROQUEL) 25 MG tablet Take 25 mg by mouth at bedtime.    [provider]  spironolactone (ALDACTONE) 100 MG tablet Take 1 tablet (100 mg total) by mouth daily for 15 days.  11/15/18 11/30/18  Carrie Mew, MD  tamsulosin (FLOMAX) 0.4 MG CAPS capsule Take 1 capsule (0.4 mg total) by mouth 2 (two) times daily. Patient taking differently: Take 0.4 mg by mouth daily.  08/03/18   Billey Co, MD  XIFAXAN 550 MG TABS tablet Take 550 mg by mouth every evening.  07/22/18   [provider]     Allergies Lisinopril; Penicillins; and Clindamycin/lincomycin   Family History  Problem Relation Age of Onset  . Allergies Mother   . Diabetes Father   . Diabetes Sister     Social History Social History   Tobacco Use  . Smoking status: Former Smoker    Packs/day: 1.00    Types: Cigarettes, Cigars    Last attempt to quit: 1985    Years since quitting: 35.2  . Smokeless tobacco: Never Used  Substance Use Topics  . Alcohol use: Not Currently    Comment: occasional  . Drug use: No    Review of Systems  Constitutional:   No fever or chills.  ENT:   No sore throat. No rhinorrhea. Cardiovascular:   No chest pain or syncope. Respiratory:   No dyspnea or cough. Gastrointestinal:   Negative for abdominal pain, vomiting and diarrhea.  Positive for abdominal distention Musculoskeletal:   Negative for focal pain or swelling All other systems reviewed and are negative except as documented above in ROS and HPI.  ____________________________________________   PHYSICAL EXAM:  VITAL SIGNS: ED Triage Vitals  Enc Vitals Group     BP 11/14/18 2204 (!) 144/85     Pulse Rate 11/14/18 2202 80     Resp 11/14/18 2202 18     Temp 11/14/18 2202 97.9 F (36.6 C)     Temp Source 11/14/18 2202 Oral     SpO2 11/14/18 2202 96 %     Weight 11/14/18 2201 175 lb (79.4 kg)     Height 11/14/18 2201 5\' 10"  (1.778 m)     Head Circumference --      Peak Flow --      Pain Score 11/14/18 2201 0     Pain Loc --      Pain Edu? --      Excl. in [place]? --     Vital signs reviewed, nursing assessments reviewed.   Constitutional:   Alert and oriented. Non-toxic  appearance. Eyes:   Conjunctivae are normal. EOMI. PERRL. ENT      Head:   Normocephalic and atraumatic.      Nose:   No congestion/rhinnorhea.       Mouth/Throat:   MMM, no pharyngeal erythema. No peritonsillar mass.  Neck:   No meningismus. Full ROM. Hematological/Lymphatic/Immunilogical:   No cervical lymphadenopathy. Cardiovascular:   RRR. Symmetric bilateral radial and DP pulses.  No murmurs. Cap refill less than 2 seconds. Respiratory:   Normal respiratory effort without tachypnea/retractions. Breath sounds are clear and equal bilaterally. No wheezes/rales/rhonchi. Gastrointestinal:   Soft and nontender.  Moderately distended.   No rebound, rigidity, or guarding.  Abdomen somewhat tympanitic.  Rectal exam shows brown stool, Hemoccult negative  Musculoskeletal:   Normal range of motion in all extremities. No joint effusions.  No lower extremity tenderness.  Trace pitting edema. Neurologic:   Normal speech and language.  Motor grossly intact. No acute focal neurologic deficits are appreciated.  Skin:    Skin is warm, dry and intact. No rash noted.  No petechiae, purpura, or bullae.  ____________________________________________    LABS (pertinent positives/negatives) (all labs ordered are listed, but only abnormal results are displayed) Labs Reviewed  CBC WITH DIFFERENTIAL/PLATELET - Abnormal; Notable for the following components:      Result Value   WBC 3.6 (*)    Hemoglobin 10.1 (*)    HCT 31.9 (*)    MCV 70.9 (*)    MCH 22.4 (*)    RDW 18.4 (*)    Platelets 87 (*)    All other components within normal limits  COMPREHENSIVE METABOLIC PANEL - Abnormal; Notable for the following components:   Glucose, Bld 213 (*)    Calcium 8.5 (*)    Total Protein 5.8 (*)    Albumin 2.9 (*)    Total Bilirubin 1.5 (*)    All other components within normal limits  PROTIME-INR - Abnormal; Notable for the following components:   Prothrombin Time 15.5 (*)    All other components within  normal limits  LIPASE, BLOOD  ETHANOL  AMMONIA  URINALYSIS, COMPLETE (UACMP) WITH MICROSCOPIC  TYPE AND SCREEN   ____________________________________________   EKG   ____________________________________________    RADIOLOGY  Dg Chest 1 View  Result Date: 11/15/2018 CLINICAL DATA:  Tachypnea EXAM: CHEST  1 VIEW COMPARISON:  10/23/2018 FINDINGS: Left pacer remains in place, unchanged. Cardiomegaly with vascular congestion. Low lung volumes. No confluent opacities or overt edema. No effusions or acute bony abnormality. IMPRESSION: Low lung volumes.  Cardiomegaly with vascular congestion. Electronically Signed   By: Rolm Baptise M.D.   On: 11/15/2018 00:17   Ct Abdomen Pelvis W Contrast  Result Date: 11/15/2018 CLINICAL DATA:  Abdominal distension for the past 3 weeks with marked progression today. The patient reports esophageal varices banded 3 weeks ago. EXAM: CT ABDOMEN AND PELVIS WITH CONTRAST TECHNIQUE: Multidetector CT imaging of the abdomen and pelvis was performed using the standard protocol following bolus administration of intravenous contrast. CONTRAST:  126mL OMNIPAQUE IOHEXOL 300 MG/ML  SOLN COMPARISON:  07/19/2018. FINDINGS: Lower chest: Intracardiac pacemaker leads. Dense mitral valve annulus calcifications. Coronary artery calcifications. Mildly enlarged heart. Interval small bilateral pleural effusions. Stable 5 mm right middle lobe nodule on image number 6 series 4. Increased prominence of the pulmonary vasculature at the lung bases. Hepatobiliary: The liver contours remain irregular with a small right lobe and enlarged lateral segment left lobe and caudate lobe. Currently, there is a poorly defined oval, mildly heterogeneous mass in the superior aspect of the dome of the liver anteriorly, measuring 3.6 x 3.1 cm on image number 15 series 2. Large number of gallstones filling the gallbladder. The largest measures 1.5 cm in maximum diameter. No gallbladder wall thickening or  pericholecystic fluid seen. Pancreas:  Unremarkable. No pancreatic ductal dilatation or surrounding inflammatory changes. Spleen: No significant change in enlargement of the spleen, measuring 18.1 cm in length. Adrenals/Urinary Tract: Normal appearing adrenal glands. The previously demonstrated 7.4 x 6.2 cm upper pole right renal mass measures 7.1 x 6.1 cm and corresponding dimensions today. Multiple bilateral renal cysts. Unremarkable urinary bladder and ureters. Stomach/Bowel: Multiple small sigmoid colon diverticula. Normal appearing stomach, small bowel and appendix. Vascular/Lymphatic: Mild atheromatous calcifications with no aneurysm or enlarged lymph nodes. Reproductive: Prostate is unremarkable. Other: Interval moderate to large amount of ascites. Interval diffuse subcutaneous edema. Upper abdominal varices and recanalized umbilical vein. Small to moderate-sized bilateral inguinal hernias containing fat. Musculoskeletal: Mild thoracolumbar spine degenerative changes. IMPRESSION: 1. 3.6 cm poorly defined mass in the dome of the liver anteriorly. This could represent a metastasis or primary liver neoplasm. 2. Stable 7.1 cm upper pole right renal mass consistent with a renal cell carcinoma. 3. Stable 5 mm right middle lobe nodule. This is nonspecific with a metastasis a possibility. Again recommended is consideration of a chest CT with contrast. 4. Interval moderate to large amount of ascites. 5. Interval small bilateral pleural effusions. 6. Interval mild changes of congestive heart failure. 7. 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 8. Cholelithiasis. 9. Sigmoid diverticulosis. Electronically Signed   By: Claudie Revering M.D.   On: 11/15/2018 00:10    ____________________________________________   PROCEDURES Procedures  ____________________________________________  DIFFERENTIAL DIAGNOSIS   Bowel perforation, bowel obstruction, ascites, intestinal gas.  Doubt biliary  disease pancreatitis SBP or volvulus.  Doubt AAA, dissection, mesenteric ischemia.  CLINICAL IMPRESSION / ASSESSMENT AND PLAN / ED COURSE  Medications ordered in the ED: Medications  iohexol (OMNIPAQUE) 300 MG/ML solution 100 mL (100 mLs Intravenous Contrast Given 11/14/18 2346)    Pertinent labs & imaging results that were available during my care of the patient were reviewed by me and considered in my medical decision making (see chart for details).    Patient presents with abdominal distention, no pain.  Vital signs unremarkable exam overall reassuring but with his severe comorbidities, CT obtained to ensure he does not have any symptomatic perforation or bowel obstruction primarily.  CT unremarkable, chest x-ray overall unremarkable without edema as well.  Even asleep patient is breathing comfortably without increased work of breathing, maintaining good oxygen saturation of 97 to 98% on room air.  I have confirmed through discussion with family and electronic medical record reviewed that the patient is not on an ACE or an arm.  Reviewed ascites management and up-to-date, and feel that the next most appropriate step is to start the patient on spironolactone and Lasix and have him follow-up with his established gastroenterologist this week or his primary care doctor.  Discussed with family who agree.  CT does show renal mass concerning for renal cell carcinoma with possible metastasis to the liver.  These findings can be followed up by his outpatient providers.      ____________________________________________   FINAL CLINICAL IMPRESSION(S) / ED DIAGNOSES    Final diagnoses:  Cirrhosis of liver with ascites, unspecified hepatic cirrhosis type [facility])     ED Discharge Orders         Ordered    furosemide (LASIX) 40 MG tablet  Daily     11/15/18 0254    spironolactone (ALDACTONE) 100 MG tablet  Daily     11/15/18 0254          Portions of this note were generated with dragon  dictation software. Dictation errors may occur despite best attempts at proofreading.   Carrie Mew, MD 11/15/18 0300    Carrie Mew, MD 11/15/18 [phone]

## 2018-11-15 NOTE — Discharge Instructions (Signed)
Start spironolactone and furosemide as prescribed in the morning.  Take these 1 time a day.  Please follow-up with gastroenterology or your primary care doctor this week for recheck of your symptoms and lab work.  Return to the ED if any worsening symptoms, confusion, severe weakness, shortness of breath, or worsening swelling.

## 2018-11-17 ENCOUNTER — Telehealth: Payer: Self-pay

## 2018-11-17 ENCOUNTER — Encounter: Payer: Self-pay | Admitting: Gastroenterology

## 2018-11-17 ENCOUNTER — Ambulatory Visit (INDEPENDENT_AMBULATORY_CARE_PROVIDER_SITE_OTHER): Payer: Medicare Other | Admitting: Gastroenterology

## 2018-11-17 ENCOUNTER — Other Ambulatory Visit: Payer: Self-pay

## 2018-11-17 VITALS — BP 124/74 | HR 86 | Ht 70.0 in | Wt 255.8 lb

## 2018-11-17 DIAGNOSIS — K7460 Unspecified cirrhosis of liver: Secondary | ICD-10-CM | POA: Diagnosis not present

## 2018-11-17 DIAGNOSIS — R188 Other ascites: Secondary | ICD-10-CM | POA: Diagnosis not present

## 2018-11-17 MED ORDER — SPIRONOLACTONE 100 MG PO TABS: 100.0000 mg | ORAL_TABLET | Freq: Every day | ORAL | 0 refills | Status: DC

## 2018-11-17 NOTE — Progress Notes (Signed)
Jonathon Bellows MD, MRCP(U.K) [address]  [place]  [place], [place] [address]  Main: [phone]  Fax: [phone]   Primary Care Physician: Idelle Crouch, MD  Primary Gastroenterologist:  Dr. Jonathon Bellows   Chief Complaint  Patient presents with   Hospitalization Follow-up    Cirrhosis of Liver with ascites    HPI: [name] Day is a 78 y.o. male    HPI: [name] Day is a 78 y.o. male   Summary of history :  He is today here to see me for hospital follow-up.  Going back in time he has been a patient of Dr. Vira Agar in the past seen for heme-positive stools in April 2018.  At that point of time he had actually had a microcytic anemia with a hemoglobin of 10.8 g with an MCV of 70.  He had an upper endoscopy and a colonoscopy which demonstrated an portal hypertensive gastropathy and a diminutive polyp that was excised.  For unknown reasons he did not have a complete work-up with a capsule study of the small bowel.  In 05/21/2018 he presented to the hospital with weakness and was found to have a hemoglobin of 9.5 g.  He had been on aspirin and Motrin at that point of time.  His MCV was 68.5.  He had not had any overt blood loss.  He has had issues with his memory.  Point of time endoscopy procedures were deferred due to a change in his neurological status.  He had declined any endoscopy procedures at that point of time.  He was yet again seen in the hospital as an inpatient on 10/23/2018 for hematemesis.  Hb had dropped over the recent period of time.  There was an encapsulated renal mass seen on imaging and he was supposed to be referred to [facility] for that. On 10/24/2018 had an upper endoscopy and the esophageal varices that were banded.  Heart rate was 60 bpm and was not commenced on nadolol.  He was treated with octreotide and discharged.   Interval history 11/01/2018-11/17/2018  11/14/2018: INR 1.2 , ferritin 92, Iron 34, Normal b12,folate. Albumin 2.9 ,cr 0.93, Na  136   Brown bowel movements , no more bleeding, no confusion , 2 soft bowel movements a day , no dirrhea. No NSAID's. Here with a family member.   CT abdomen 11/15/2018 : moderate ascites , CHF changes ,cirrhosis of liver.   Was in the ER recently for abdominal distension, commenced on lasix and aldactone and d/c home. Brown stools, on a low sodium diet , no confusion   Does not wish to see [facility] wants to be referred to [facility] .  Current Outpatient Medications  Medication Sig Dispense Refill   acetaminophen (TYLENOL) 325 MG tablet Take 2 tablets (650 mg total) by mouth every 6 (six) hours as needed for moderate pain.     allopurinol (ZYLOPRIM) 300 MG tablet Take 1 tablet (300 mg total) by mouth daily as needed (gout). (Patient not taking: Reported on 11/02/2018) 30 tablet 0   ALPRAZolam (XANAX) 0.5 MG tablet Take 0.5 mg by mouth at bedtime as needed for anxiety.     FREESTYLE LITE test strip 1 each by Other route as directed. 100 each 0   furosemide (LASIX) 40 MG tablet Take 1 tablet (40 mg total) by mouth daily for 15 days. 15 tablet 0   gabapentin (NEURONTIN) 100 MG capsule Take 2 capsules (200 mg total) by mouth 3 (three) times daily. (  Patient taking differently: Take 100 mg by mouth 2 (two) times daily. ) 180 capsule 0   lactulose (CHRONULAC) 10 GM/15ML solution Take 15 mLs (10 g total) by mouth daily. (Patient taking differently: Take 10 g by mouth 2 (two) times daily. ) 240 mL 0   lamoTRIgine (LAMICTAL) 25 MG tablet Take 2 tablets (50 mg total) by mouth 2 (two) times daily. (Patient taking differently: Take 60 mg by mouth at bedtime. ) 180 tablet 0   lidocaine (LIDODERM) 5 % Place 1 patch onto the skin daily. Remove & Discard patch within 12 hours or as directed by MD (Patient not taking: Reported on 11/02/2018) 30 patch 0   liver oil-zinc oxide (DESITIN) 40 % ointment Apply topically 3 (three) times daily as needed for irritation. (Patient not taking: Reported on  11/02/2018) 56.7 g 0   losartan (COZAAR) 50 MG tablet      Lurasidone HCl (LATUDA) 60 MG TABS Take 1 tablet (60 mg total) by mouth daily. (Patient taking differently: Take 30 mg by mouth at bedtime. ) 30 tablet 0   metFORMIN (GLUCOPHAGE) 500 MG tablet Take 500 mg by mouth daily as needed (high blood sugar).     omeprazole (PRILOSEC) 40 MG capsule Take 1 capsule (40 mg total) by mouth 2 (two) times daily. 30 capsule 0   polyethylene glycol (MIRALAX / GLYCOLAX) packet Take 17 g by mouth daily as needed for mild constipation. (Patient not taking: Reported on 11/02/2018) 14 each 0   pravastatin (PRAVACHOL) 20 MG tablet      QUEtiapine (SEROQUEL) 25 MG tablet Take 25 mg by mouth at bedtime.     spironolactone (ALDACTONE) 100 MG tablet Take 1 tablet (100 mg total) by mouth daily for 15 days. 15 tablet 0   tamsulosin (FLOMAX) 0.4 MG CAPS capsule Take 1 capsule (0.4 mg total) by mouth 2 (two) times daily. (Patient taking differently: Take 0.4 mg by mouth daily. ) 60 capsule 0   XIFAXAN 550 MG TABS tablet Take 550 mg by mouth every evening.      No current facility-administered medications for this visit.     Allergies as of 11/17/2018 - Review Complete 11/17/2018  Allergen Reaction Noted   Lisinopril Other (See Comments)    Penicillins Other (See Comments)    Clindamycin/lincomycin Rash and Other (See Comments) 04/24/2016    ROS:  General: Negative for anorexia, weight loss, fever, chills, fatigue, weakness. ENT: Negative for hoarseness, difficulty swallowing , nasal congestion. CV: Negative for chest pain, angina, palpitations, dyspnea on exertion, peripheral edema.  Respiratory: Negative for dyspnea at rest, dyspnea on exertion, cough, sputum, wheezing.  GI: See history of present illness. GU:  Negative for dysuria, hematuria, urinary incontinence, urinary frequency, nocturnal urination.  Endo: Negative for unusual weight change.    Physical Examination:   BP 124/74    Pulse 86     Ht 5\' 10"  (1.778 m)    Wt 255 lb 12.8 oz (116 kg)    BMI 36.70 kg/m   General: Well-nourished, well-developed in no acute distress.  Eyes: No icterus. Conjunctivae pink. Mouth: Oropharyngeal mucosa moist and pink , no lesions erythema or exudate. Abdomen: distended asciytes Extremities: No lower extremity edema. No clubbing or deformities. Neuro: Alert and oriented x 3.  Grossly intact. Skin: Warm and dry, no jaundice.   Psych: Alert and cooperative, normal mood and affect.   Imaging Studies: Dg Chest 1 View  Result Date: 11/15/2018 CLINICAL DATA:  Tachypnea EXAM: CHEST  1 VIEW  COMPARISON:  10/23/2018 FINDINGS: Left pacer remains in place, unchanged. Cardiomegaly with vascular congestion. Low lung volumes. No confluent opacities or overt edema. No effusions or acute bony abnormality. IMPRESSION: Low lung volumes.  Cardiomegaly with vascular congestion. Electronically Signed   By: Rolm Baptise M.D.   On: 11/15/2018 00:17   Ct Head Wo Contrast  Result Date: 10/23/2018 CLINICAL DATA:  Patient fell 3 times yesterday. EXAM: CT HEAD WITHOUT CONTRAST CT CERVICAL SPINE WITHOUT CONTRAST TECHNIQUE: Multidetector CT imaging of the head and cervical spine was performed following the standard protocol without intravenous contrast. Multiplanar CT image reconstructions of the cervical spine were also generated. COMPARISON:  Head CT 07/31/2018. FINDINGS: CT HEAD FINDINGS Brain: There is no evidence for acute hemorrhage, hydrocephalus, mass lesion, or abnormal extra-axial fluid collection. No definite CT evidence for acute infarction. Diffuse loss of parenchymal volume is consistent with atrophy. Patchy low attenuation in the deep hemispheric and periventricular white matter is nonspecific, but likely reflects chronic microvascular ischemic demyelination. 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 Vascular: No hyperdense vessel or unexpected calcification. Skull: No evidence for fracture. No  worrisome lytic or sclerotic lesion. Sinuses/Orbits: Chronic opacification of the right maxillary sinus and right mastoid air cells. Opacification of the right sphenoid sinus is new since prior study. Other: None. CT CERVICAL SPINE FINDINGS Alignment: Straightening of normal cervical lordosis evident. Trace anterolisthesis of C3 on 4 likely related to associated facet disease. Skull base and vertebrae: No acute fracture. No primary bone lesion or focal pathologic process. Soft tissues and spinal canal: No prevertebral fluid or swelling. No visible canal hematoma. Disc levels: Loss of disc height with endplate degeneration evident at C5-6 and C6-7. Upper cervical facet degeneration noted bilaterally. Upper chest: Negative. Other: None. IMPRESSION: 1. No acute intracranial abnormality. 2. Atrophy with chronic small vessel white matter ischemic disease. 3. Chronic right maxillary and ethmoid sinus disease. Opacification of the right sphenoid sinus is new since prior study. 4. Degenerative changes in the cervical spine without fracture. 5. Loss of cervical lordosis. This can be related to patient positioning, muscle spasm or soft tissue injury. Electronically Signed   By: Misty Stanley M.D.   On: 10/23/2018 13:23   Ct Cervical Spine Wo Contrast  Result Date: 10/23/2018 CLINICAL DATA:  Patient fell 3 times yesterday. EXAM: CT HEAD WITHOUT CONTRAST CT CERVICAL SPINE WITHOUT CONTRAST TECHNIQUE: Multidetector CT imaging of the head and cervical spine was performed following the standard protocol without intravenous contrast. Multiplanar CT image reconstructions of the cervical spine were also generated. COMPARISON:  Head CT 07/31/2018. FINDINGS: CT HEAD FINDINGS Brain: There is no evidence for acute hemorrhage, hydrocephalus, mass lesion, or abnormal extra-axial fluid collection. No definite CT evidence for acute infarction. Diffuse loss of parenchymal volume is consistent with atrophy. Patchy low attenuation in the  deep hemispheric and periventricular white matter is nonspecific, but likely reflects chronic microvascular ischemic demyelination. 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 Vascular: No hyperdense vessel or unexpected calcification. Skull: No evidence for fracture. No worrisome lytic or sclerotic lesion. Sinuses/Orbits: Chronic opacification of the right maxillary sinus and right mastoid air cells. Opacification of the right sphenoid sinus is new since prior study. Other: None. CT CERVICAL SPINE FINDINGS Alignment: Straightening of normal cervical lordosis evident. Trace anterolisthesis of C3 on 4 likely related to associated facet disease. Skull base and vertebrae: No acute fracture. No primary bone lesion or focal pathologic process. Soft tissues and spinal canal: No prevertebral fluid or swelling.  No visible canal hematoma. Disc levels: Loss of disc height with endplate degeneration evident at C5-6 and C6-7. Upper cervical facet degeneration noted bilaterally. Upper chest: Negative. Other: None. IMPRESSION: 1. No acute intracranial abnormality. 2. Atrophy with chronic small vessel white matter ischemic disease. 3. Chronic right maxillary and ethmoid sinus disease. Opacification of the right sphenoid sinus is new since prior study. 4. Degenerative changes in the cervical spine without fracture. 5. Loss of cervical lordosis. This can be related to patient positioning, muscle spasm or soft tissue injury. Electronically Signed   By: Misty Stanley M.D.   On: 10/23/2018 13:23   Ct Abdomen Pelvis W Contrast  Result Date: 11/15/2018 CLINICAL DATA:  Abdominal distension for the past 3 weeks with marked progression today. The patient reports esophageal varices banded 3 weeks ago. EXAM: CT ABDOMEN AND PELVIS WITH CONTRAST TECHNIQUE: Multidetector CT imaging of the abdomen and pelvis was performed using the standard protocol following bolus administration of intravenous contrast. CONTRAST:  117mL  OMNIPAQUE IOHEXOL 300 MG/ML  SOLN COMPARISON:  07/19/2018. FINDINGS: Lower chest: Intracardiac pacemaker leads. Dense mitral valve annulus calcifications. Coronary artery calcifications. Mildly enlarged heart. Interval small bilateral pleural effusions. Stable 5 mm right middle lobe nodule on image number 6 series 4. Increased prominence of the pulmonary vasculature at the lung bases. Hepatobiliary: The liver contours remain irregular with a small right lobe and enlarged lateral segment left lobe and caudate lobe. Currently, there is a poorly defined oval, mildly heterogeneous mass in the superior aspect of the dome of the liver anteriorly, measuring 3.6 x 3.1 cm on image number 15 series 2. Large number of gallstones filling the gallbladder. The largest measures 1.5 cm in maximum diameter. No gallbladder wall thickening or pericholecystic fluid seen. Pancreas: Unremarkable. No pancreatic ductal dilatation or surrounding inflammatory changes. Spleen: No significant change in enlargement of the spleen, measuring 18.1 cm in length. Adrenals/Urinary Tract: Normal appearing adrenal glands. The previously demonstrated 7.4 x 6.2 cm upper pole right renal mass measures 7.1 x 6.1 cm and corresponding dimensions today. Multiple bilateral renal cysts. Unremarkable urinary bladder and ureters. Stomach/Bowel: Multiple small sigmoid colon diverticula. Normal appearing stomach, small bowel and appendix. Vascular/Lymphatic: Mild atheromatous calcifications with no aneurysm or enlarged lymph nodes. Reproductive: Prostate is unremarkable. Other: Interval moderate to large amount of ascites. Interval diffuse subcutaneous edema. Upper abdominal varices and recanalized umbilical vein. Small to moderate-sized bilateral inguinal hernias containing fat. Musculoskeletal: Mild thoracolumbar spine degenerative changes. IMPRESSION: 1. 3.6 cm poorly defined mass in the dome of the liver anteriorly. This could represent a metastasis or primary  liver neoplasm. 2. Stable 7.1 cm upper pole right renal mass consistent with a renal cell carcinoma. 3. Stable 5 mm right middle lobe nodule. This is nonspecific with a metastasis a possibility. Again recommended is consideration of a chest CT with contrast. 4. Interval moderate to large amount of ascites. 5. Interval small bilateral pleural effusions. 6. Interval mild changes of congestive heart failure. 7. 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 8. Cholelithiasis. 9. Sigmoid diverticulosis. Electronically Signed   By: Claudie Revering M.D.   On: 11/15/2018 00:10   Dg Chest Port 1 View  Result Date: 10/23/2018 CLINICAL DATA:  Intubation after endoscopy. EXAM: PORTABLE CHEST 1 VIEW COMPARISON:  07/31/2018 FINDINGS: 1532 hours. Endotracheal tube tip is 1.4 cm above the base the carina. Low lung volumes. The cardio pericardial silhouette is enlarged. Left base atelectasis noted. No edema or focal airspace consolidation. No substantial pleural  effusion. Interstitial markings are diffusely coarsened with chronic features. Left permanent pacemaker again noted. Telemetry leads overlie the chest. Prominent gastric bubble. IMPRESSION: 1. Endotracheal tube tip 1.4 cm above the base of the carina. 2. Low volume film without left base atelectasis. 3. Prominent gastric bubble. Electronically Signed   By: Misty Stanley M.D.   On: 10/23/2018 17:33    Assessment and Plan:   [name] Day is a 78 y.o. y/o male here to follow up for liver cirhosis with esophageal varices- S/p banding for esophageal varices. Has a renal mass seen on recent CT scan , was to be seen at [facility] but he wants to see a doctor at [facility].  Repeat EGD plus banding in 6 weeks (delayed due to coronovirus and avoiding non urgent procedures) 2.  No NSAIDs. 3.  RUQ USG to screen for [facility] in 05/2019  4. 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 Continue Xifaxan23 5. Avoid  excess tyelnol - limit to 500 mg a day if needed 6. On lasix 40 and aldactone 100- will check cmp and is stable will increase aldactone 7. Daily abdominal girth chart 8. Phone call in a week  9. Low sodium diet   Dr Jonathon Bellows  MD,MRCP [facility]) Follow up in 2 months

## 2018-11-17 NOTE — Telephone Encounter (Signed)
Patients son has been informed that his fathers colonoscopy scheduled for 11/22/18 has been canceled due to [place].  We will call him back to reschedule.  Thanks [employer]

## 2018-11-17 NOTE — Addendum Note (Signed)
Addended by: Dorethea Clan on: 11/17/2018 02:11 PM   Modules accepted: Orders

## 2018-11-18 ENCOUNTER — Telehealth: Payer: Self-pay | Admitting: Gastroenterology

## 2018-11-18 LAB — COMPREHENSIVE METABOLIC PANEL
ALT: 13 IU/L (ref 0–44)
AST: 30 IU/L (ref 0–40)
Albumin/Globulin Ratio: 1.1 — ABNORMAL LOW (ref 1.2–2.2)
Albumin: 3.1 g/dL — ABNORMAL LOW (ref 3.7–4.7)
Alkaline Phosphatase: 130 IU/L — ABNORMAL HIGH (ref 39–117)
BUN/Creatinine Ratio: 11 (ref 10–24)
BUN: 11 mg/dL (ref 8–27)
Bilirubin Total: 1.3 mg/dL — ABNORMAL HIGH (ref 0.0–1.2)
CO2: 20 mmol/L (ref 20–29)
Calcium: 8.8 mg/dL (ref 8.6–10.2)
Chloride: 99 mmol/L (ref 96–106)
Creatinine, Ser: 1 mg/dL (ref 0.76–1.27)
GFR calc Af Amer: 83 mL/min/{1.73_m2} (ref >59)
GFR calc non Af Amer: 72 mL/min/{1.73_m2} (ref >59)
Globulin, Total: 2.7 g/dL (ref 1.5–4.5)
Glucose: 178 mg/dL — ABNORMAL HIGH (ref 65–99)
Potassium: 3.9 mmol/L (ref 3.5–5.2)
Sodium: 135 mmol/L (ref 134–144)
Total Protein: 5.8 g/dL — ABNORMAL LOW (ref 6.0–8.5)

## 2018-11-18 MED ORDER — FUROSEMIDE 40 MG PO TABS: 40.0000 mg | ORAL_TABLET | Freq: Every day | ORAL | 0 refills | Status: AC

## 2018-11-18 NOTE — Telephone Encounter (Signed)
Heather(daughter-in-law)called stating the were here 11-17-2018 an see Dr Vicente Males. They were to have two prescriptions  Per the conversation in the visit & on the AVS. 1)  spironolactone (ALDACTONE) 100 MG tablet   But to increase the dose & 2)furosemide (LASIX) 40 MG tablet called into the [facility] in [place].

## 2018-11-19 ENCOUNTER — Encounter: Payer: Self-pay | Admitting: Gastroenterology

## 2018-11-22 ENCOUNTER — Ambulatory Visit: Admit: 2018-11-22 | Payer: Medicare Other | Admitting: Gastroenterology

## 2018-11-22 SURGERY — ESOPHAGOGASTRODUODENOSCOPY (EGD) WITH PROPOFOL
Anesthesia: General

## 2018-11-22 NOTE — Telephone Encounter (Signed)
Spoke with pt daughter [name] she is aware that we've sent the prescription refills and is aware that the increase in the spironolactone will depend on pt lab results.

## 2018-11-23 NOTE — Telephone Encounter (Signed)
Heather calling back to check on medication & labs. Please call her.

## 2018-11-24 ENCOUNTER — Other Ambulatory Visit: Payer: Self-pay | Admitting: Family Medicine

## 2018-11-24 ENCOUNTER — Other Ambulatory Visit: Payer: Self-pay

## 2018-11-24 DIAGNOSIS — K7460 Unspecified cirrhosis of liver: Secondary | ICD-10-CM

## 2018-11-24 DIAGNOSIS — R188 Other ascites: Secondary | ICD-10-CM

## 2018-11-24 MED ORDER — SPIRONOLACTONE 100 MG PO TABS: 150.0000 mg | ORAL_TABLET | Freq: Every day | ORAL | 1 refills | Status: AC

## 2018-11-24 MED ORDER — TAMSULOSIN HCL 0.4 MG PO CAPS: 0.4000 mg | ORAL_CAPSULE | Freq: Two times a day (BID) | ORAL | 6 refills | Status: DC

## 2018-11-24 NOTE — Telephone Encounter (Signed)
Spoke with pt daughter, [name], and informed her of pt lab results and Dr. Georgeann Oppenheim directions to increase the Aldactone to 150 mg daily, stay on a low salt diet, check patient's abdominal girth daily and to have pt repeat labs in 1 week. Prescription for the aldactone has been sent to pt preferred pharmacy.

## 2018-11-24 NOTE — Telephone Encounter (Signed)
[name] Day : CMP looks good  Plan   1. Keep lasix 40 mg  the same dose no change  2. IF on 100 aldactone increase to 150 mg a day  3. IF on 50 then go to 100 mg a day aldactone - check dose   Recheck BMP - 5-7 days   Daily weights or abdominal girth at home Low salt diet   C/c Sparks, Leonie Douglas, MD   Dr Jonathon Bellows MD,MRCP [facility]) Gastroenterology/Hepatology Pager: [phone]

## 2018-12-06 ENCOUNTER — Telehealth: Payer: Self-pay | Admitting: Gastroenterology

## 2018-12-06 NOTE — Telephone Encounter (Signed)
1. What dose of lasix aldactone is he on ? 2. Is he gaining weight  3. Does he thinks some fluid needs tobe drained and if yes can we arrange with radiology

## 2018-12-06 NOTE — Telephone Encounter (Signed)
Called pt daughter, [name] regarding her concerns for Mr. Juarbe condition  Unable to contact LVM to return call

## 2018-12-06 NOTE — Telephone Encounter (Signed)
Spoke to daughter n law and patient is having w/ ascites.... symptoms complaning of SOB and pressure in back when lying down on his back. Please call [facility] at [phone]. Has a virtual visit for 04/15 @ 8:45.

## 2018-12-07 ENCOUNTER — Other Ambulatory Visit: Payer: Self-pay

## 2018-12-07 DIAGNOSIS — R188 Other ascites: Secondary | ICD-10-CM

## 2018-12-07 MED ORDER — ALBUMIN HUMAN 25 % IV SOLN: 50.0000 g | Freq: Once | INTRAVENOUS | 0 refills | Status: AC

## 2018-12-07 NOTE — Telephone Encounter (Signed)
jadijah- may warrant usg of the abdomen and draining any ascites if present- that may help with breathing can we arrange

## 2018-12-07 NOTE — Telephone Encounter (Signed)
Spoke with pt daughter, [name], and informed her of Dr. Georgeann Oppenheim suggestion for Ultrasound of the abdomen with possibility of having fluid removed. She agrees. She is aware of pt appointment information.

## 2018-12-08 ENCOUNTER — Other Ambulatory Visit: Payer: Self-pay

## 2018-12-08 ENCOUNTER — Ambulatory Visit
Admission: RE | Admit: 2018-12-08 | Discharge: 2018-12-08 | Disposition: A | Discharge status: Home / Self Care | Payer: Medicare Other | Source: Ambulatory Visit | Attending: Gastroenterology | Admitting: Gastroenterology

## 2018-12-08 DIAGNOSIS — R188 Other ascites: Secondary | ICD-10-CM

## 2018-12-08 MED ORDER — ALBUMIN HUMAN 25 % IV SOLN
INTRAVENOUS | Status: AC
  Administered 2018-12-08: 25 g via INTRAVENOUS
  Filled 2018-12-08: qty 100

## 2018-12-08 MED ORDER — ALBUMIN HUMAN 25 % IV SOLN
25.0000 g | Freq: Once | INTRAVENOUS | Status: AC
  Administered 2018-12-08: 14:00:00 25 g via INTRAVENOUS

## 2018-12-08 MED ORDER — ALBUMIN HUMAN 25 % IV SOLN
25.0000 g | Freq: Once | INTRAVENOUS | Status: AC
  Administered 2018-12-08: 25 g via INTRAVENOUS

## 2018-12-08 NOTE — Procedures (Signed)
US paracentesis Total pending EBL 0 Comp 0

## 2018-12-08 NOTE — Progress Notes (Signed)
Patient discharged home with [facility]. Discharge instructions discussed with [name] and return precautions. Patient also given copy of instructions. IV removed. Patient wheeled out to family. Did receive HCPOA from [place] to be scanned into system. ACTIVE FYI made as well.

## 2018-12-13 ENCOUNTER — Telehealth: Payer: Self-pay | Admitting: Gastroenterology

## 2018-12-13 NOTE — Telephone Encounter (Signed)
[name] Day(daughter-in-law/poa)Called & wanted to know if patient should stay on furosemide (LASIX) 40 MG tablet (Expired) & spironolactone (ALDACTONE) 100 MG tablet. Please advise.

## 2018-12-13 NOTE — Telephone Encounter (Signed)
Spoke with pt daughter, [name], and informed her of Dr. Georgeann Oppenheim instructions.

## 2018-12-13 NOTE — Telephone Encounter (Signed)
Yes, if possible check daily weights and please ask patient to call in weights in 4-5 days time to see if gaining or losing

## 2018-12-15 ENCOUNTER — Ambulatory Visit (INDEPENDENT_AMBULATORY_CARE_PROVIDER_SITE_OTHER): Payer: Medicare Other | Admitting: Gastroenterology

## 2018-12-15 DIAGNOSIS — G9340 Encephalopathy, unspecified: Secondary | ICD-10-CM

## 2018-12-15 DIAGNOSIS — K7460 Unspecified cirrhosis of liver: Secondary | ICD-10-CM | POA: Diagnosis not present

## 2018-12-15 DIAGNOSIS — R188 Other ascites: Secondary | ICD-10-CM | POA: Diagnosis not present

## 2018-12-15 DIAGNOSIS — N2889 Other specified disorders of kidney and ureter: Secondary | ICD-10-CM | POA: Diagnosis not present

## 2018-12-15 NOTE — Progress Notes (Signed)
[name] Day , MD [address]  [place]  [place], [place] [address]  Main: [phone]  Fax: [phone]   Primary Care Physician: Idelle Crouch, MD  Virtual Visit via Video Note  I connected with patient on 12/15/18 at  8:45 AM EDT by video and verified that I am speaking with the correct person using two identifiers.   I discussed the limitations, risks, security and privacy concerns of performing an evaluation and management service by video  and the availability of in person appointments. I also discussed with the patient that there may be a patient responsible charge related to this service. The patient expressed understanding and agreed to proceed.  Location of Patient: Home Location of Provider: Home Persons involved: Patient and provider only   History of Present Illness: Chief Complaint  Patient presents with   Follow-up    Cirrhosis of Liver with Ascites    HPI: [name] Day is a 78 y.o. male  Summary of history :  He is today here to see me for hospital follow-up. Going back in time he has been a patient of Dr. Vira Agar in the past seen for heme-positive stools in April 2018. At that point of time he had actually had a microcytic anemia with a hemoglobin of 10.8 g with an MCV of 70. He had an upper endoscopy and a colonoscopy which demonstrated an portal hypertensive gastropathy and a diminutive polyp that was excised. For unknown reasons he did not have a complete work-up with a capsule study of the small bowel. In 05/21/2018 he presented to the hospital with weakness and was found to have a hemoglobin of 9.5 g. He had been on aspirin and Motrin at that point of time. His MCV was 68.5. He had not had any overt blood loss. He has had issues with his memory. At that point of time endoscopy procedures were deferred due to a change in his neurological status,declined any endoscopy procedures . He was yet again seen in the hospital as an inpatient  on 10/23/2018 for hematemesis. Hb had dropped over the recent period of time. 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  On 10/24/2018 had an upper endoscopy and the esophageal varices that were banded. Heart rate was 60 bpm and was not commenced on nadolol. He was treated with octreotide and discharged. 11/14/2018: INR 1.2 , ferritin 92, Iron 34, Normal b12,folate. Albumin 2.9 ,cr 0.93, Na 136  CT abdomen 11/15/2018 : moderate ascites , CHF changes ,cirrhosis of liver.    Interval history3/18/2020-12/15/2018    12/08/2018: underwent large volume paracentesis. Feeling better. Denies any abdominal pain. Denies any confusion or memory issues. HE is having daily bowel movements, alternating lactulose accordingly.   He is on spironolactone 150 mg , lasix 40 mg. It was increased last week. Due for repeat labs. There is concern he may have had another stroke and seeing a neurologist. He has not yet seen [facility]. Does not wish to see [facility]   He is losing weight per family and they have been checking his abdominal girth  Current Outpatient Medications  Medication Sig Dispense Refill   acetaminophen (TYLENOL) 325 MG tablet Take 2 tablets (650 mg total) by mouth every 6 (six) hours as needed for moderate pain.     ALPRAZolam (XANAX) 0.5 MG tablet Take 0.5 mg by mouth at bedtime as needed for anxiety.  FREESTYLE LITE test strip 1 each by Other route as directed. 100 each 0   furosemide (LASIX) 40 MG tablet Take 1 tablet (40 mg total) by mouth daily. 90 tablet 0   gabapentin (NEURONTIN) 100 MG capsule Take 2 capsules (200 mg total) by mouth 3 (three) times daily. (Patient taking differently: Take 100 mg by mouth 2 (two) times daily. ) 180 capsule 0   lactulose (CHRONULAC) 10 GM/15ML solution Take 15 mLs (10 g total) by mouth daily. (Patient taking differently: Take 10 g by mouth 2 (two) times daily. ) 240 mL 0     lamoTRIgine (LAMICTAL) 25 MG tablet Take 2 tablets (50 mg total) by mouth 2 (two) times daily. (Patient taking differently: Take 60 mg by mouth at bedtime. ) 180 tablet 0   Lurasidone HCl (LATUDA) 60 MG TABS Take 1 tablet (60 mg total) by mouth daily. (Patient taking differently: Take 30 mg by mouth at bedtime. ) 30 tablet 0   metFORMIN (GLUCOPHAGE) 500 MG tablet Take 500 mg by mouth daily as needed (high blood sugar).     omeprazole (PRILOSEC) 40 MG capsule Take 1 capsule (40 mg total) by mouth 2 (two) times daily. 30 capsule 0   QUEtiapine (SEROQUEL) 25 MG tablet Take 25 mg by mouth at bedtime.     spironolactone (ALDACTONE) 100 MG tablet Take 1.5 tablets (150 mg total) by mouth daily. 135 tablet 1   tamsulosin (FLOMAX) 0.4 MG CAPS capsule Take 1 capsule (0.4 mg total) by mouth 2 (two) times daily. 60 capsule 6   XIFAXAN 550 MG TABS tablet Take 550 mg by mouth every evening.      allopurinol (ZYLOPRIM) 300 MG tablet Take 1 tablet (300 mg total) by mouth daily as needed (gout). (Patient not taking: Reported on 11/02/2018) 30 tablet 0   furosemide (LASIX) 40 MG tablet Take 1 tablet (40 mg total) by mouth daily for 15 days. 15 tablet 0   lidocaine (LIDODERM) 5 % Place 1 patch onto the skin daily. Remove & Discard patch within 12 hours or as directed by MD (Patient not taking: Reported on 11/02/2018) 30 patch 0   liver oil-zinc oxide (DESITIN) 40 % ointment Apply topically 3 (three) times daily as needed for irritation. (Patient not taking: Reported on 11/02/2018) 56.7 g 0   polyethylene glycol (MIRALAX / GLYCOLAX) packet Take 17 g by mouth daily as needed for mild constipation. (Patient not taking: Reported on 11/02/2018) 14 each 0   pravastatin (PRAVACHOL) 20 MG tablet      No current facility-administered medications for this visit.     Allergies as of 12/15/2018 - Review Complete 12/15/2018  Allergen Reaction Noted   Lisinopril Other (See Comments)    Penicillins Other (See  Comments)    Clindamycin/lincomycin Rash and Other (See Comments) 04/24/2016    Review of Systems:    All systems reviewed and negative except where noted in HPI.  General Appearance:    Alert, cooperative, no distress, appears stated age  Head:    Normocephalic, without obvious abnormality, atraumatic  Eyes:    PERRL, conjunctiva/corneas clear,  Ears:    Grossly normal hearing    Neurologic:  Grossly normal    Observations/Objective:  Labs: CMP     Component Value Date/Time   NA 135 11/17/2018 0922   K 3.9 11/17/2018 0922   CL 99 11/17/2018 0922   CO2 20 11/17/2018 0922   GLUCOSE 178 (H) 11/17/2018 0922   GLUCOSE 213 (H) 11/14/2018 2207  BUN 11 11/17/2018 0922   CREATININE 1.00 11/17/2018 0922   CALCIUM 8.8 11/17/2018 0922   PROT 5.8 (L) 11/17/2018 0922   ALBUMIN 3.1 (L) 11/17/2018 0922   AST 30 11/17/2018 0922   ALT 13 11/17/2018 0922   ALKPHOS 130 (H) 11/17/2018 0922   BILITOT 1.3 (H) 11/17/2018 0922   GFRNONAA 72 11/17/2018 0922   GFRAA 83 11/17/2018 0922   Lab Results  Component Value Date   WBC 3.6 (L) 11/14/2018   HGB 10.1 (L) 11/14/2018   HCT 31.9 (L) 11/14/2018   MCV 70.9 (L) 11/14/2018   PLT 87 (L) 11/14/2018    Imaging Studies: US Paracentesis  Result Date: 12/08/2018 INDICATION: Ascites EXAM: ULTRASOUND GUIDED  PARACENTESIS MEDICATIONS: None. COMPLICATIONS: None immediate. PROCEDURE: Informed written consent was obtained from the patient after a discussion of the risks, benefits and alternatives to treatment. A timeout was performed prior to the initiation of the procedure. Initial ultrasound scanning demonstrates a moderate amount of ascites within the right lower abdominal quadrant. The right lower abdomen was prepped and draped in the usual sterile fashion. 1% lidocaine with epinephrine was used for local anesthesia. Following this, a 6 Fr Safe-T-Centesis catheter was introduced. An ultrasound image was saved for documentation purposes. The  paracentesis was performed. The catheter was removed and a dressing was applied. The patient tolerated the procedure well without immediate post procedural complication. FINDINGS: A total of approximately 5.2 L of clear yellow fluid was removed. IMPRESSION: Successful ultrasound-guided paracentesis yielding 5.2 liters of peritoneal fluid. Electronically Signed   By: Marybelle Killings M.D.   On: 12/08/2018 15:14    Assessment and Plan:   [name] Day is a 78 y.o. y/o male here to follow up for liver cirhosis with esophageal varices- S/p banding for esophageal varices. Has a renal mass seen on recent CT scan , was to be seen at [facility] but he wants to see a doctor at [place] - referred last visit but no return cal from urology yet   Plan 1. Repeat EGD plus banding in 12-16 weeks (delayed due to coronovirus and avoiding non urgent procedures), may need neurology clearance due to recent stroke 2. No NSAIDs. 3. RUQ USG to screen for [facility] in 05/2019  4. 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 Continue Xifaxan.  5. Avoid excess tyelnol - limit to 500 mg a day if needed 6. On lasix 40 and aldactone 150- will check cmp and is stable will increase aldactone 7. Daily abdominal girth chart 8.. Low sodium diet   F/u in 3 weeks      I discussed the assessment and treatment plan with the patient. The patient was provided an opportunity to ask questions and all were answered. The patient agreed with the plan and demonstrated an understanding of the instructions.   The patient was advised to call back or seek an in-person evaluation if the symptoms worsen or if the condition fails to improve as anticipated.  I provided 7 minutes of face-to-face time during this encounter.  Dr [name] Bellows MD,MRCP [facility]) Gastroenterology/Hepatology Pager: [phone]   Speech recognition software was used to dictate this note.

## 2018-12-23 ENCOUNTER — Other Ambulatory Visit: Payer: Self-pay | Admitting: Neurology

## 2018-12-23 DIAGNOSIS — R531 Weakness: Secondary | ICD-10-CM

## 2018-12-23 DIAGNOSIS — I639 Cerebral infarction, unspecified: Secondary | ICD-10-CM

## 2018-12-27 ENCOUNTER — Ambulatory Visit: Payer: Medicare Other

## 2018-12-27 ENCOUNTER — Other Ambulatory Visit: Payer: Self-pay

## 2018-12-27 ENCOUNTER — Ambulatory Visit
Admission: RE | Admit: 2018-12-27 | Discharge: 2018-12-27 | Disposition: A | Discharge status: Home / Self Care | Payer: Medicare Other | Source: Ambulatory Visit | Attending: Neurology | Admitting: Neurology

## 2018-12-27 DIAGNOSIS — R531 Weakness: Secondary | ICD-10-CM | POA: Insufficient documentation

## 2018-12-27 DIAGNOSIS — I639 Cerebral infarction, unspecified: Secondary | ICD-10-CM | POA: Insufficient documentation

## 2018-12-29 ENCOUNTER — Ambulatory Visit: Payer: Medicare Other | Admitting: Gastroenterology

## 2019-01-03 ENCOUNTER — Other Ambulatory Visit: Payer: Self-pay

## 2019-01-03 MED ORDER — TAMSULOSIN HCL 0.4 MG PO CAPS: 0.4000 mg | ORAL_CAPSULE | Freq: Two times a day (BID) | ORAL | 6 refills | Status: AC

## 2019-01-05 ENCOUNTER — Other Ambulatory Visit
Admission: RE | Admit: 2019-01-05 | Discharge: 2019-01-05 | Disposition: A | Discharge status: Home / Self Care | Payer: Medicare Other | Source: Ambulatory Visit | Attending: Internal Medicine | Admitting: Internal Medicine

## 2019-01-05 ENCOUNTER — Other Ambulatory Visit: Payer: Self-pay | Admitting: Urology

## 2019-01-05 DIAGNOSIS — K7290 Hepatic failure, unspecified without coma: Secondary | ICD-10-CM | POA: Diagnosis present

## 2019-01-05 DIAGNOSIS — I10 Essential (primary) hypertension: Secondary | ICD-10-CM | POA: Insufficient documentation

## 2019-01-05 DIAGNOSIS — E1159 Type 2 diabetes mellitus with other circulatory complications: Secondary | ICD-10-CM | POA: Diagnosis present

## 2019-01-05 LAB — AMMONIA: Ammonia: 66 umol/L — ABNORMAL HIGH (ref 9–35)

## 2019-01-05 MED ORDER — NYSTATIN-TRIAMCINOLONE 100000-0.1 UNIT/GM-% EX OINT: 1.0000 "application " | TOPICAL_OINTMENT | Freq: Two times a day (BID) | CUTANEOUS | 0 refills | Status: AC

## 2019-01-05 NOTE — Telephone Encounter (Signed)
Refill sent in

## 2019-01-05 NOTE — Telephone Encounter (Signed)
Pt daughter in law calls office asking for a refill of pt Nystatin cream to be called in at [facility]. Please advise [phone].

## 2019-01-12 ENCOUNTER — Ambulatory Visit (INDEPENDENT_AMBULATORY_CARE_PROVIDER_SITE_OTHER): Payer: Medicare Other | Admitting: Gastroenterology

## 2019-01-12 ENCOUNTER — Telehealth: Payer: Self-pay | Admitting: Gastroenterology

## 2019-01-12 ENCOUNTER — Ambulatory Visit: Admit: 2019-01-12 | Payer: Medicare Other | Admitting: Ophthalmology

## 2019-01-12 DIAGNOSIS — I639 Cerebral infarction, unspecified: Secondary | ICD-10-CM

## 2019-01-12 DIAGNOSIS — G9340 Encephalopathy, unspecified: Secondary | ICD-10-CM

## 2019-01-12 SURGERY — PHACOEMULSIFICATION, CATARACT, WITH IOL INSERTION
Anesthesia: Choice | Laterality: Right

## 2019-01-12 NOTE — Telephone Encounter (Signed)
Called pt to schedule 1 week f/u Virtual visit pt states he had to call me back and think about it. Pt did not schedule f/u apt for now.

## 2019-01-12 NOTE — Progress Notes (Signed)
[name] Day , MD [address]  [place]  [place], [place] [address]  Main: [phone]  Fax: [phone]   Primary Care Physician: Idelle Crouch, MD  Virtual Visit via Video Note  I connected with patient on 01/12/19 at  8:30 AM EDT by video and verified that I am speaking with the correct person using two identifiers.   I discussed the limitations, risks, security and privacy concerns of performing an evaluation and management service by video  and the availability of in person appointments. I also discussed with the patient that there may be a patient responsible charge related to this service. The patient expressed understanding and agreed to proceed.  Location of Patient: Home Location of Provider: Home Persons involved: Patient and provider only   History of Present Illness: Chief Complaint  Patient presents with   Follow-up    Cirrhosis of Liver    HPI: [name] Day is a 78 y.o. male  Summary of history :  He is today here to see me for hospital follow-up. Going back in time he has been a patient of Dr. Vira Agar in the past seen for heme-positive stools in April 2018. At that point of time he had actually had a microcytic anemia with a hemoglobin of 10.8 g with an MCV of 70. He had an upper endoscopy and a colonoscopy which demonstrated an portal hypertensive gastropathy and a diminutive polyp that was excised. For unknown reasons he did not have a complete work-up with a capsule study of the small bowel. In 05/21/2018 he presented to the hospital with weakness and was found to have a hemoglobin of 9.5 g. He had been on aspirin and Motrin at that point of time. His MCV was 68.5. He had not had any overt blood loss. He has had issues with his memory. At that point of time endoscopy procedures were deferred due to a change in his neurological status,declined any endoscopy procedures . He was yet again seen in the hospital as an inpatient on 10/23/2018  for hematemesis.Hbhad dropped over the recent period of time. 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  On 10/24/2018 had an upper endoscopy and the esophageal varices that were banded. Heart rate was 60 bpm and was not commenced on nadolol. He was treated with octreotide and discharged. 11/14/2018: INR 1.2 ,ferritin 92, Iron 34, Normal b12,folate. Albumin 2.9 ,cr 0.93, Na 136 CT abdomen 11/15/2018 : moderate ascites , CHF changes ,cirrhosis of liver.  12/08/2018: underwent large volume paracentesis.  Interval history4/15/2020 -01/12/2019  Since he had paracentesis been feeling weak, not gaining weight, no change in abdominal girth.   Because he had weakness they performed a CT scan and he tells me he was all clear.   He is more confused. Daily bowel movements 1-2 times a day.  At this time on lactulose -soft in nature. He is on 2 doses a day .  Stool is clay brown. Not black. Denies cough or shortness of breath On xifaxan. He is on spironolactone 150 mg , lasix 40 mg.  Labs 01/05/2019: CBC and CMP- stable.  He fell out of bed and had a nerve pinched and has had gabapentin increased in dosage. He does have have bipolar and dementia.  He has PT once a week  and OT once a week. Hs a aid at night .  He is more sleepy in the am .  Due to see the urologist for mass on CT- has one in process.    Current Outpatient Medications  Medication Sig Dispense Refill   acetaminophen (TYLENOL) 325 MG tablet Take 2 tablets (650 mg total) by mouth every 6 (six) hours as needed for moderate pain.     ALPRAZolam (XANAX) 0.5 MG tablet Take 0.5 mg by mouth at bedtime as needed for anxiety.     FREESTYLE LITE test strip 1 each by Other route as directed. 100 each 0   furosemide (LASIX) 40 MG tablet Take 1 tablet (40 mg total) by mouth daily. 90 tablet 0   gabapentin (NEURONTIN) 100 MG capsule Take 2 capsules (200 mg total) by  mouth 3 (three) times daily. (Patient taking differently: Take 100 mg by mouth 2 (two) times daily. ) 180 capsule 0   lactulose (CHRONULAC) 10 GM/15ML solution Take 15 mLs (10 g total) by mouth daily. (Patient taking differently: Take 10 g by mouth 2 (two) times daily. ) 240 mL 0   lamoTRIgine (LAMICTAL) 25 MG tablet Take 2 tablets (50 mg total) by mouth 2 (two) times daily. (Patient taking differently: Take 60 mg by mouth at bedtime. ) 180 tablet 0   Lurasidone HCl (LATUDA) 60 MG TABS Take 1 tablet (60 mg total) by mouth daily. (Patient taking differently: Take 30 mg by mouth at bedtime. ) 30 tablet 0   metFORMIN (GLUCOPHAGE) 500 MG tablet Take 500 mg by mouth daily as needed (high blood sugar).     nystatin-triamcinolone ointment (MYCOLOG) Apply 1 application topically 2 (two) times daily. 30 g 0   omeprazole (PRILOSEC) 40 MG capsule Take 1 capsule (40 mg total) by mouth 2 (two) times daily. 30 capsule 0   pravastatin (PRAVACHOL) 20 MG tablet      QUEtiapine (SEROQUEL) 25 MG tablet Take 25 mg by mouth at bedtime.     spironolactone (ALDACTONE) 100 MG tablet Take 1.5 tablets (150 mg total) by mouth daily. 135 tablet 1   tamsulosin (FLOMAX) 0.4 MG CAPS capsule Take 1 capsule (0.4 mg total) by mouth 2 (two) times daily. 60 capsule 6   XIFAXAN 550 MG TABS tablet Take 550 mg by mouth every evening.      allopurinol (ZYLOPRIM) 300 MG tablet Take 1 tablet (300 mg total) by mouth daily as needed (gout). (Patient not taking: Reported on 11/02/2018) 30 tablet 0   furosemide (LASIX) 40 MG tablet Take 1 tablet (40 mg total) by mouth daily for 15 days. 15 tablet 0   lidocaine (LIDODERM) 5 % Place 1 patch onto the skin daily. Remove & Discard patch within 12 hours or as directed by MD (Patient not taking: Reported on 11/02/2018) 30 patch 0   liver oil-zinc oxide (DESITIN) 40 % ointment Apply topically 3 (three) times daily as needed for irritation. (Patient not taking: Reported on 11/02/2018) 56.7 g 0     polyethylene glycol (MIRALAX / GLYCOLAX) packet Take 17 g by mouth daily as needed for mild constipation. (Patient not taking: Reported on 11/02/2018) 14 each 0   No current facility-administered medications for this visit.     Allergies as of 01/12/2019 - Review Complete 01/12/2019  Allergen Reaction Noted   Lisinopril Other (See Comments)    Penicillins Other (See Comments)    Clindamycin/lincomycin Rash and Other (See Comments) 04/24/2016    Review of Systems:    All systems reviewed and negative except where noted in HPI.  General Appearance:    Alert, cooperative, no distress, appears  stated age  Head:    Normocephalic, without obvious abnormality, atraumatic  Eyes:    PERRL, conjunctiva/corneas clear,  Ears:    Grossly normal hearing    Neurologic:  Grossly normal    Observations/Objective:  Labs: CMP     Component Value Date/Time   NA 135 11/17/2018 0922   K 3.9 11/17/2018 0922   CL 99 11/17/2018 0922   CO2 20 11/17/2018 0922   GLUCOSE 178 (H) 11/17/2018 0922   GLUCOSE 213 (H) 11/14/2018 2207   BUN 11 11/17/2018 0922   CREATININE 1.00 11/17/2018 0922   CALCIUM 8.8 11/17/2018 0922   PROT 5.8 (L) 11/17/2018 0922   ALBUMIN 3.1 (L) 11/17/2018 0922   AST 30 11/17/2018 0922   ALT 13 11/17/2018 0922   ALKPHOS 130 (H) 11/17/2018 0922   BILITOT 1.3 (H) 11/17/2018 0922   GFRNONAA 72 11/17/2018 0922   GFRAA 83 11/17/2018 0922   Lab Results  Component Value Date   WBC 3.6 (L) 11/14/2018   HGB 10.1 (L) 11/14/2018   HCT 31.9 (L) 11/14/2018   MCV 70.9 (L) 11/14/2018   PLT 87 (L) 11/14/2018    Imaging Studies: Ct Head Wo Contrast  Result Date: 12/27/2018 CLINICAL DATA:  CVA, weakness on LEFT side of body. Difficulty thinking clearly. EXAM: CT HEAD WITHOUT CONTRAST TECHNIQUE: Contiguous axial images were obtained from the base of the skull through the vertex without intravenous contrast. COMPARISON:  10/23/2018. FINDINGS: Brain: No evidence for acute infarction,  hemorrhage, mass lesion, hydrocephalus, or extra-axial fluid. Generalized atrophy. Hypoattenuation of white matter, likely small vessel disease. Numerous areas of brain substance loss bilaterally, consistent with lacunar infarction. Slit-like cavity adjacent to the RIGHT caudate and anterior limb internal capsule could represent previous basal ganglia hemorrhage or bland infarct. These areas appear stable when compared to the prior scan February 2020. Vascular: Calcification of the cavernous internal carotid arteries consistent with cerebrovascular atherosclerotic disease. No signs of intracranial large vessel occlusion. Skull: Intact. Sinuses/Orbits: Significant opacity of the RIGHT maxillary and RIGHT ethmoid air cells, ostiomeatal unit obstruction pattern, incompletely evaluated. RIGHT frontal sinus is hypoplastic. Negative orbits. Other: None. IMPRESSION: Chronic changes as described. No acute intracranial findings. Electronically Signed   By: Staci Righter M.D.   On: 12/27/2018 16:01    Assessment and Plan:   [name] Day is a 78 y.o. y/o malehere to follow up forliver cirhosis with esophageal varices- S/p banding for esophageal varices. Has a renal mass seen on recent CT scan, was to be seen at [facility] but he wants to see a doctor at [place]- referred last visit but no return cal from urology yet . Today main issue is confusion: unclear if its hepatic encephelopathy vs depression from bipolar vs effects of dementia or a combination. Not gaining weight   Plan 1. Repeat EGD plus banding in12-16 weeks (delayed due to coronovirus and avoiding non urgent procedures), may need neurology clearance due to recent stroke 2. No NSAIDs. 3.RUQ USG to screen for [facility] in 05/2019 4. Lactulose increase to 2.5 doses a day - excess has caused diarrhea in him . Continue Xifaxan.  5. Avoid excess tyelnol - limit to 500 mg a day if needed 6. On lasix 40 and aldactone 150- stable  7. Daily abdominal  girth chart 8.. Low sodium diet  F/u on monday   I discussed the assessment and treatment plan with the patient. The patient was provided an opportunity to ask questions and all were answered. The patient agreed with  the plan and demonstrated an understanding of the instructions.   The patient was advised to call back or seek an in-person evaluation if the symptoms worsen or if the condition fails to improve as anticipated.    Dr [name] Bellows MD,MRCP [facility]) Gastroenterology/Hepatology Pager: [phone]   Speech recognition software was used to dictate this note.

## 2019-01-31 DEATH — deceased

## 2019-03-21 ENCOUNTER — Ambulatory Visit: Payer: Medicare Other | Admitting: Neurology

## 2020-03-04 ENCOUNTER — Ambulatory Visit: Admit: 2020-03-04 | Payer: Medicare Other | Admitting: Ophthalmology

## 2020-03-04 SURGERY — PHACOEMULSIFICATION, CATARACT, WITH IOL INSERTION
Anesthesia: Choice | Laterality: Right

## 2020-11-17 IMAGING — CR DG CHEST 2V
1 series · 2 of 2 positions shown · non-contrast
Comparison: May 26, 2018

CLINICAL DATA: Weakness.

EXAM:
CHEST - 2 VIEW

[Series 1: dg chest 2 view · 0.14mm/px · 2 of 2 slices shown]
[im 1/2]
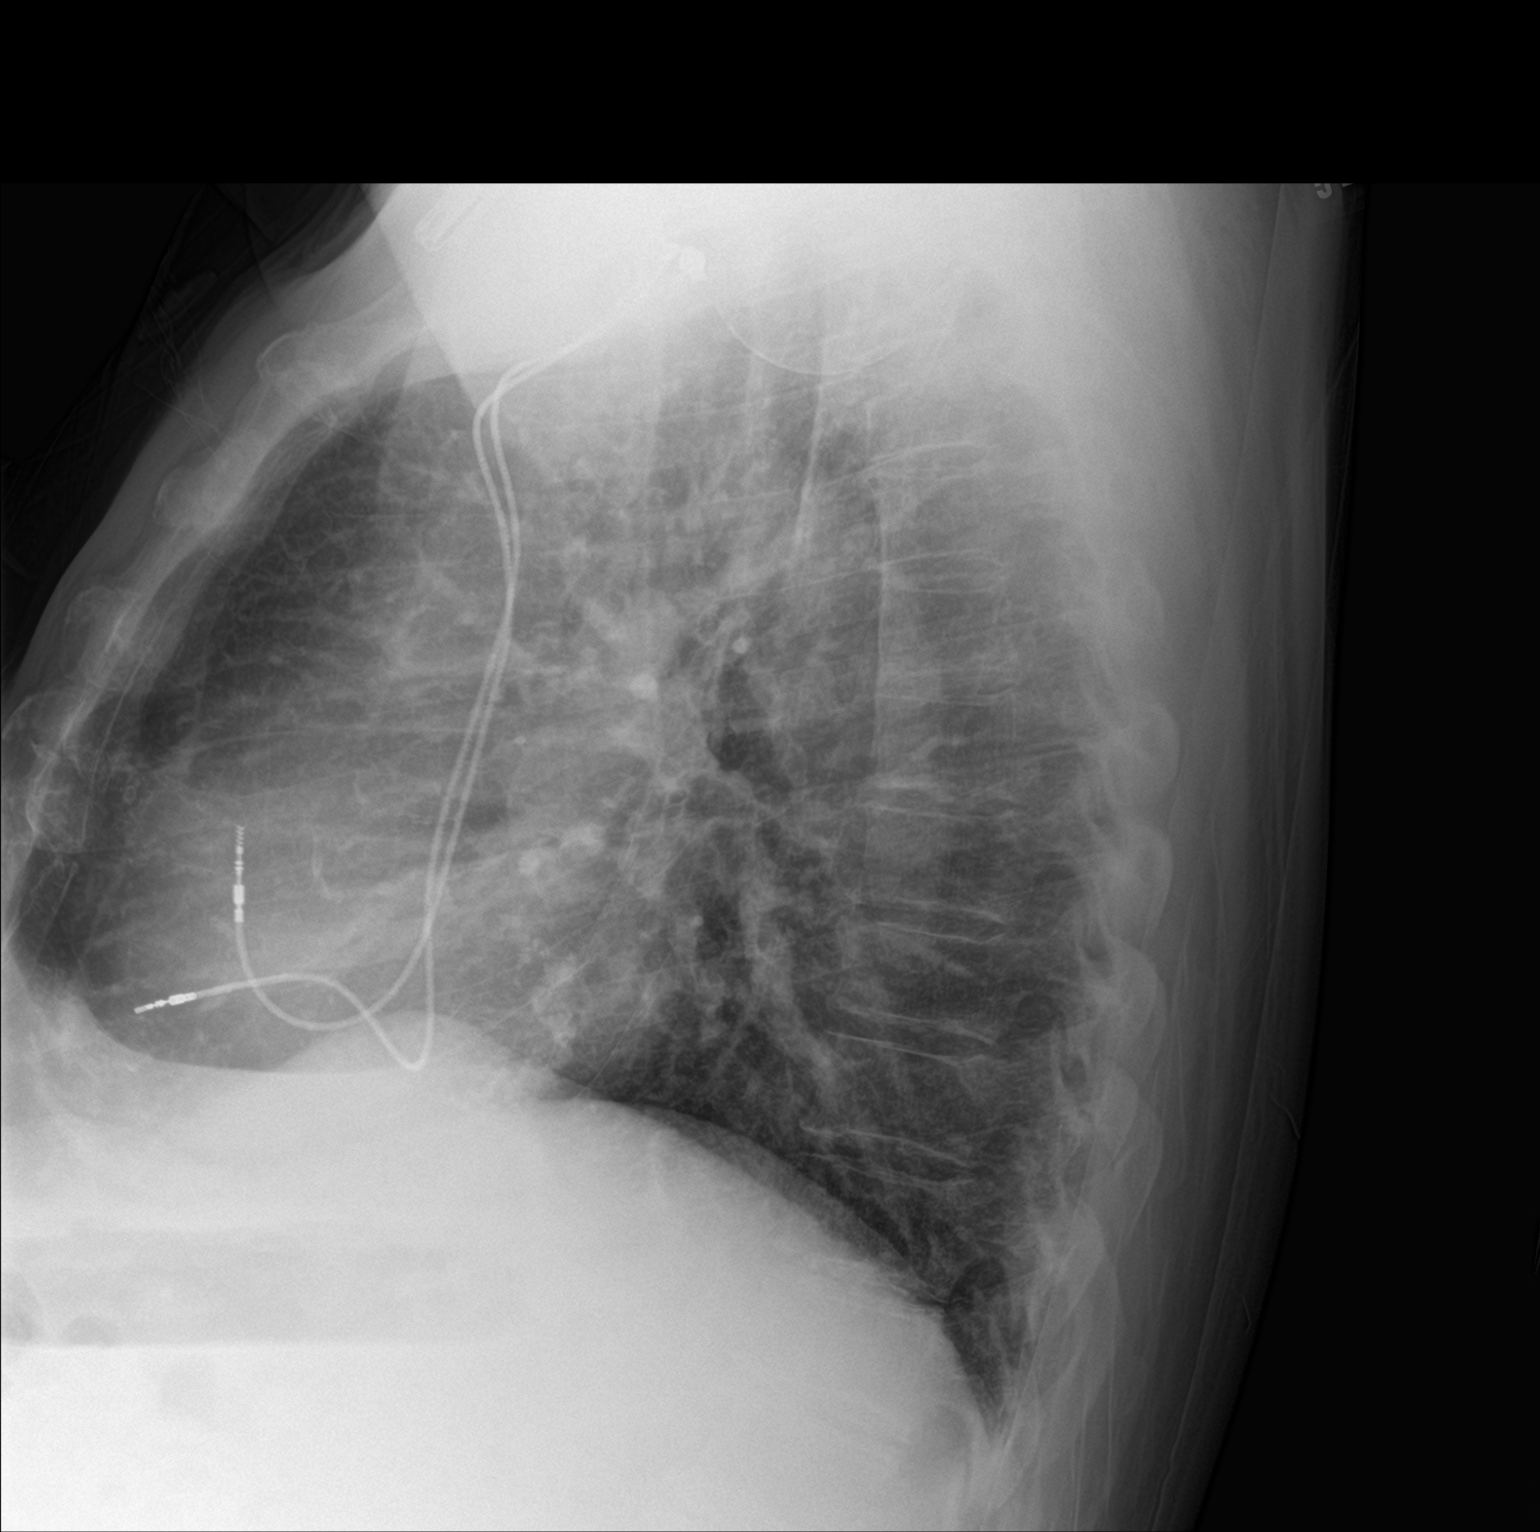
[im 2/2]
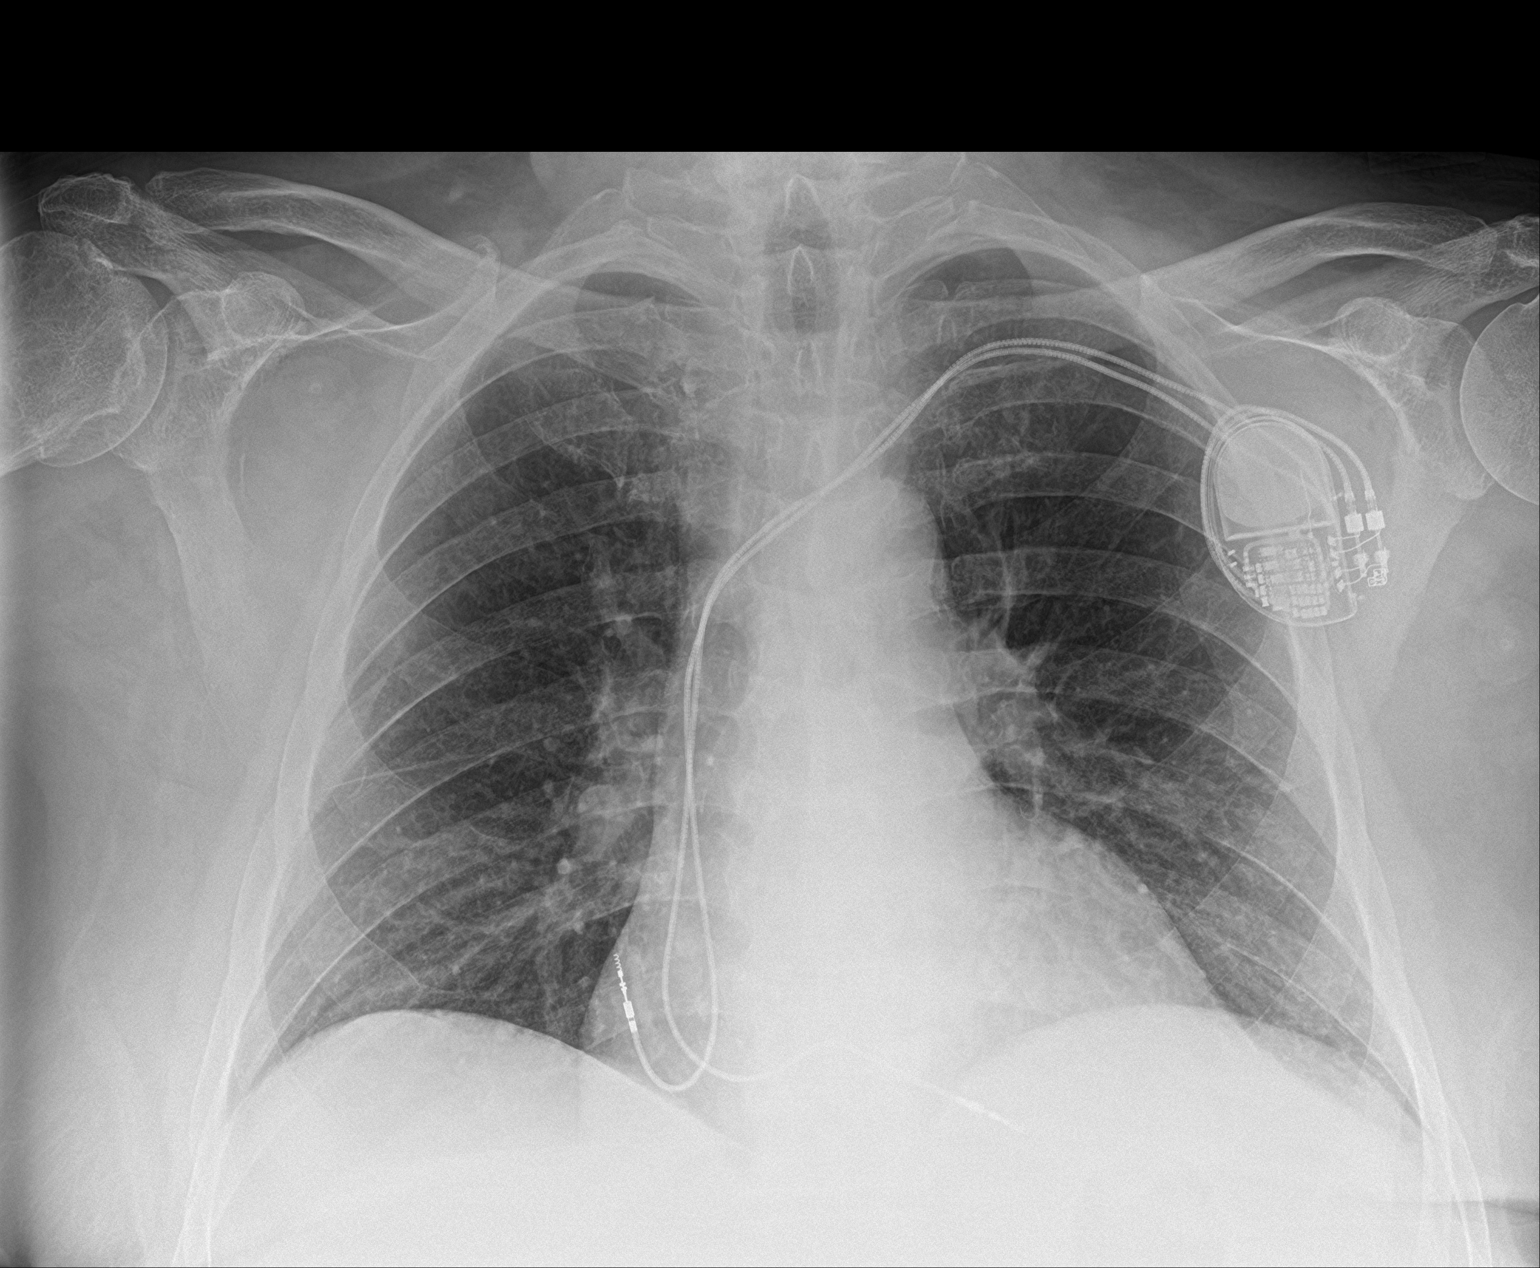

[2 of 2 positions shown; findings below may reference images not displayed]

FINDINGS: Stable pacemaker and cardiomegaly. The hila, mediastinum, lungs, and
pleura are unremarkable.
IMPRESSION: No active cardiopulmonary disease.

## 2020-11-17 IMAGING — CT CT HEAD W/O CM
3 series · 15 of 47 positions shown, 18 images · non-contrast
Comparison: 05/27/2018

CLINICAL DATA: Weakness.

EXAM:
CT HEAD WITHOUT CONTRAST
TECHNIQUE: Contiguous axial images were obtained from the base of the skull
through the vertex without intravenous contrast.

[Series 3: head wo · axial · 0.43mm/px · z∈[+550,+680]mm · 9 of 32 slices shown, 12 images]
[im 3/32  brain]
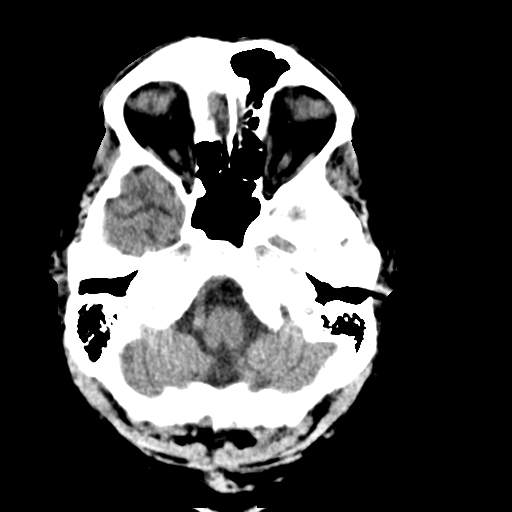
[im 3/32  bone]
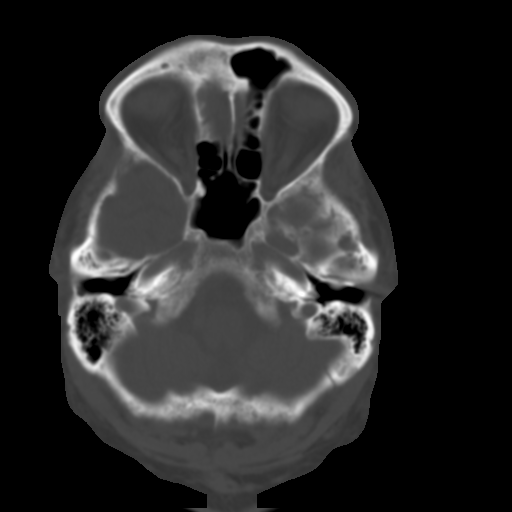
[im 6/32  brain]
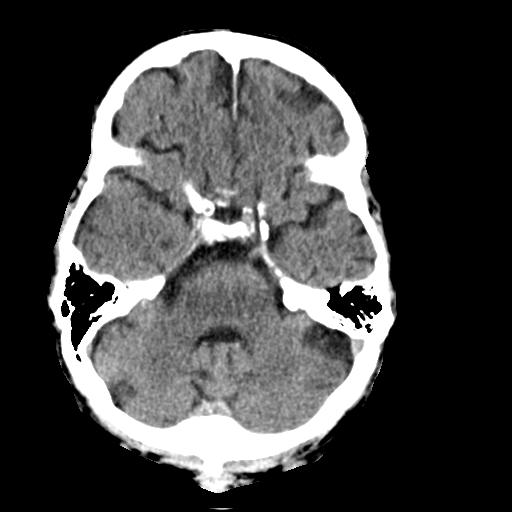
[im 9/32  brain]
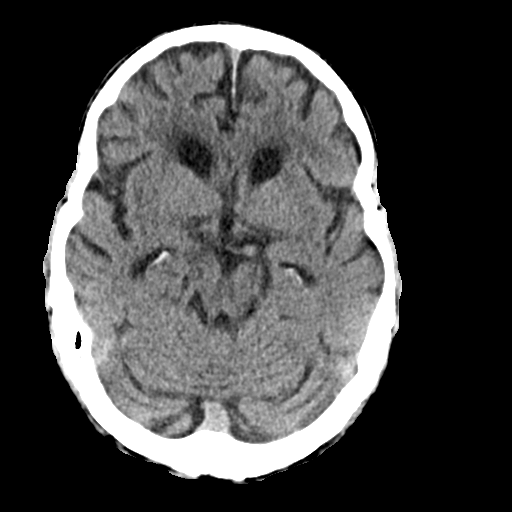
[im 12/32  brain]
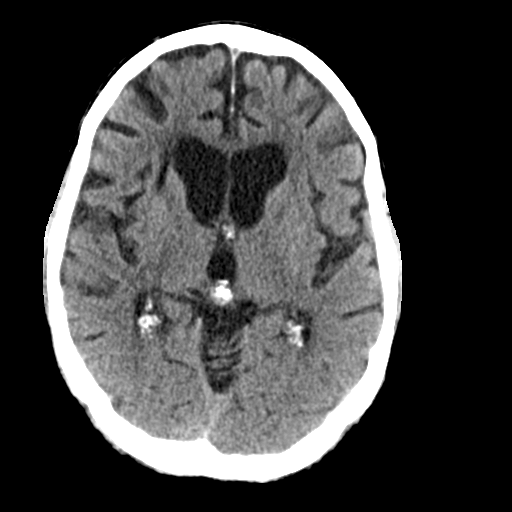
[im 17/32  brain]
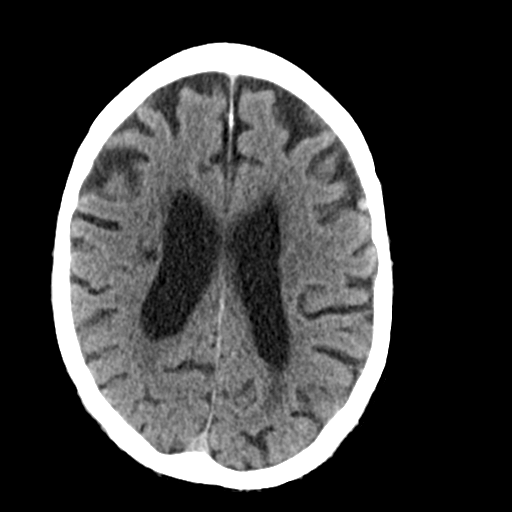
[im 17/32  bone]
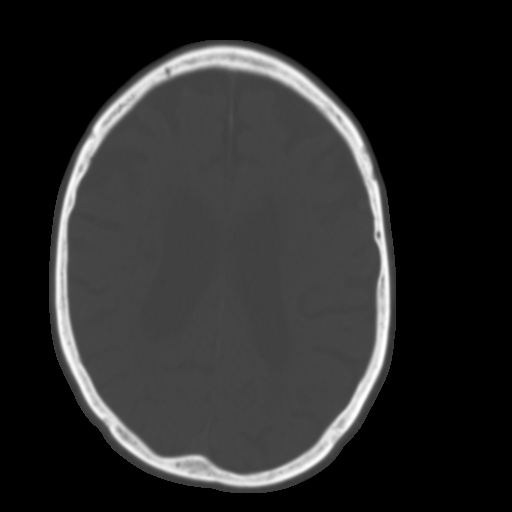
[im 20/32  brain]
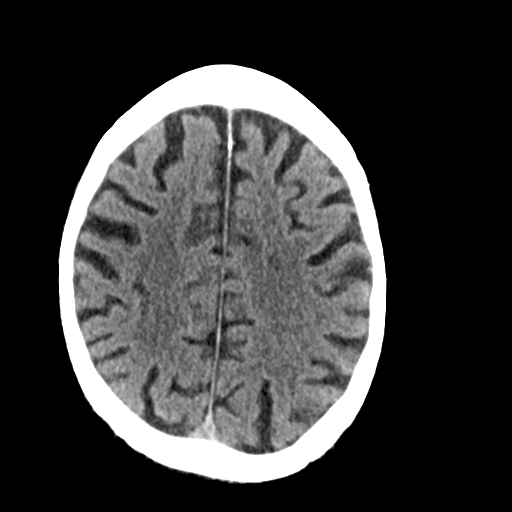
[im 23/32  brain]
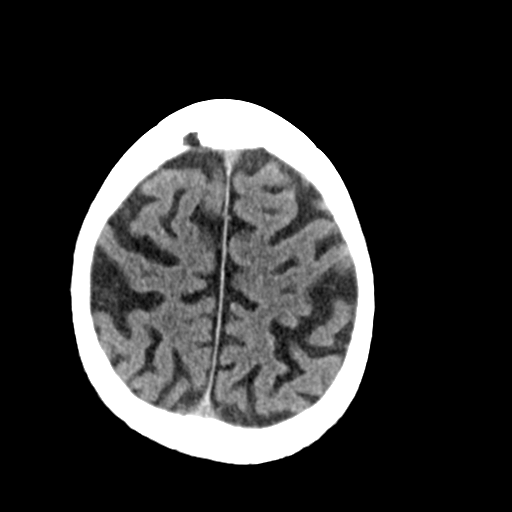
[im 26/32  brain]
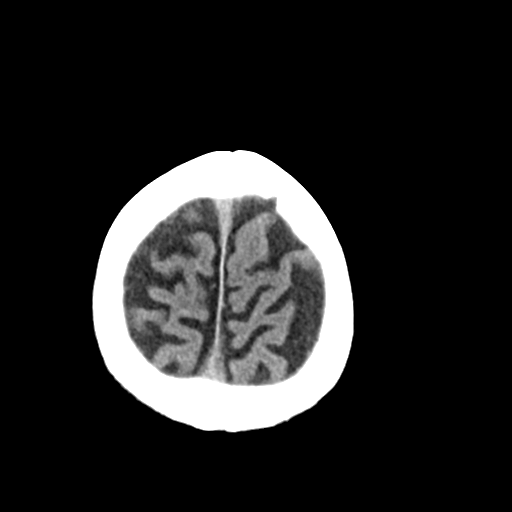
[im 29/32  brain]
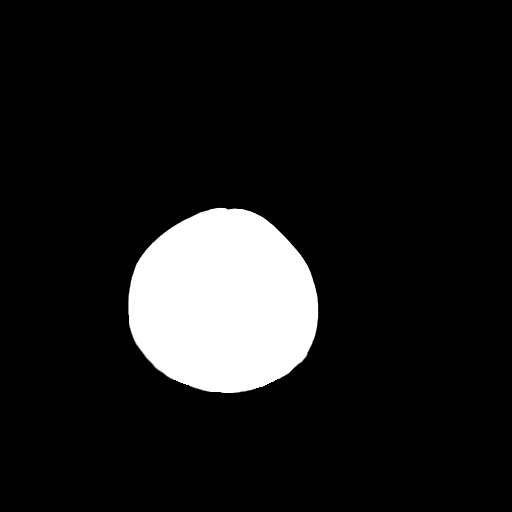
[im 29/32  bone]
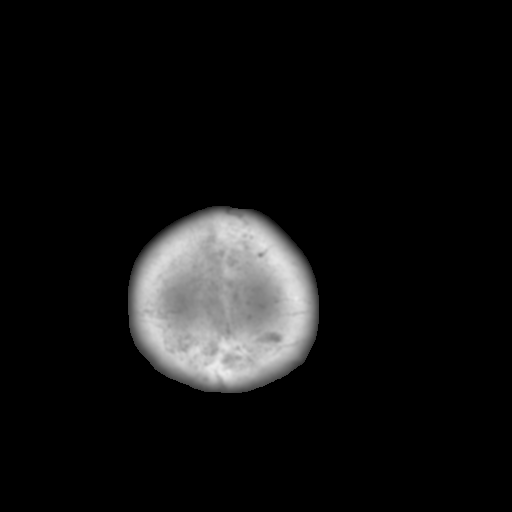

[Series 4: coronal soft tissue · coronal · 0.31mm/px · 3 of 69 slices shown]
[im 23/69  brain]
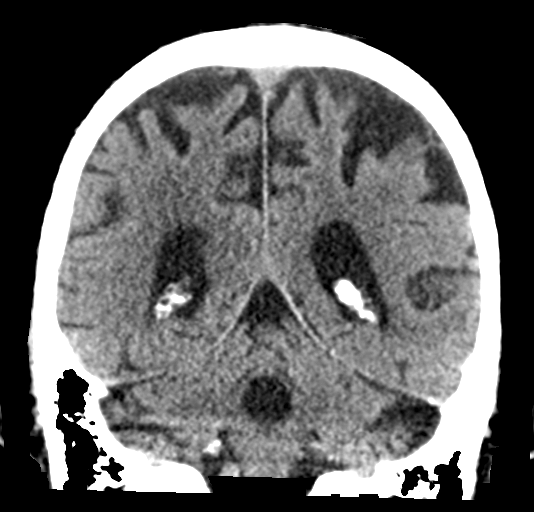
[im 31/69  brain]
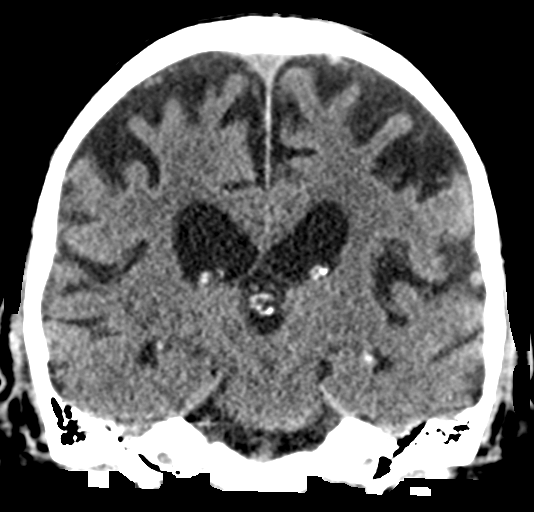
[im 38/69  brain]
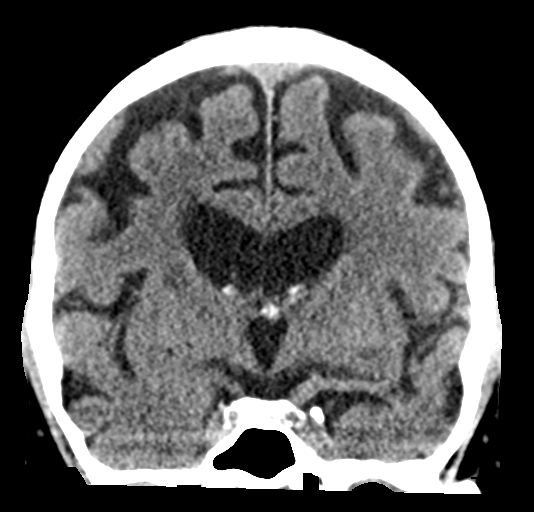

[Series 5: sagittal soft tissue · sagittal · 0.31mm/px · 3 of 57 slices shown]
[im 19/57  brain]
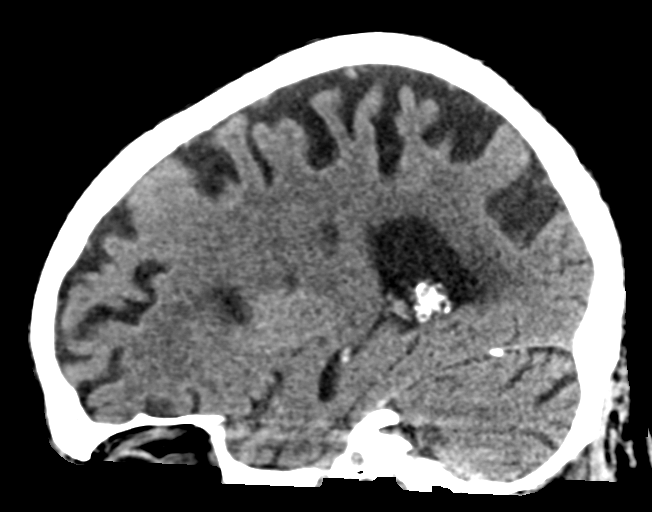
[im 29/57  brain]
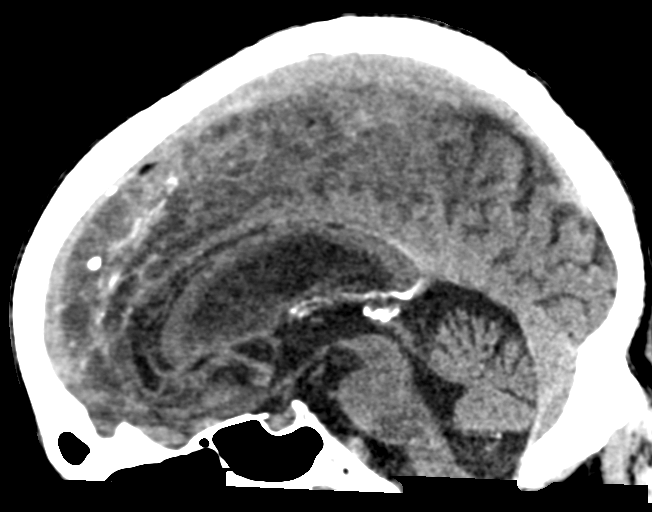
[im 38/57  brain]
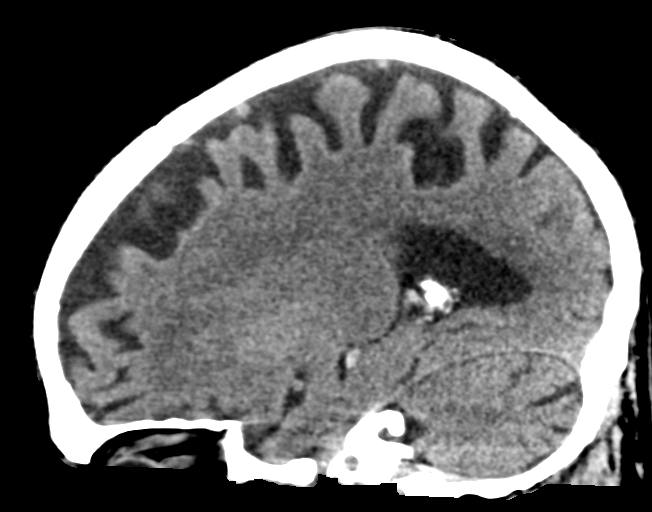

[15 of 47 positions shown; findings below may reference images not displayed]

FINDINGS: Brain: The ventricles and cisterns are within normal. There is mild
age related atrophic change. There is mild chronic ischemic
microvascular disease. Prominent perivascular spaces inferior to the
lentiform nucleus. Old small infarct adjacent over the right
periventricular white matter. No mass, mass effect or midline shift.
No acute hemorrhage. No evidence of acute infarction.

Vascular: No hyperdense vessel or unexpected calcification.

Skull: Normal. Negative for fracture or focal lesion.

Sinuses/Orbits: Sinuses are partially visualized with moderate
opacification over the right ethmoid air cells. Partially visualized
orbits are within normal.

Other: None.
IMPRESSION: No acute findings.

Mild atrophy and chronic ischemic microvascular disease. Old right
periventricular infarct.

Chronic sinus inflammatory change.
